# Patient Record
Sex: Male | Born: 1950 | Race: Black or African American | Hispanic: No | Marital: Married | Smoking: Former smoker
Health system: Southern US, Community
[De-identification: ages and names within clinical notes are randomized; demographics above are authoritative.]

## PROBLEM LIST (undated history)

## (undated) DIAGNOSIS — K219 Gastro-esophageal reflux disease without esophagitis: Secondary | ICD-10-CM

## (undated) DIAGNOSIS — I219 Acute myocardial infarction, unspecified: Secondary | ICD-10-CM

## (undated) DIAGNOSIS — I2510 Atherosclerotic heart disease of native coronary artery without angina pectoris: Secondary | ICD-10-CM

## (undated) DIAGNOSIS — E785 Hyperlipidemia, unspecified: Secondary | ICD-10-CM

## (undated) DIAGNOSIS — M1990 Unspecified osteoarthritis, unspecified site: Secondary | ICD-10-CM

## (undated) DIAGNOSIS — N529 Male erectile dysfunction, unspecified: Secondary | ICD-10-CM

## (undated) DIAGNOSIS — I209 Angina pectoris, unspecified: Secondary | ICD-10-CM

## (undated) DIAGNOSIS — G4730 Sleep apnea, unspecified: Secondary | ICD-10-CM

## (undated) DIAGNOSIS — T7840XA Allergy, unspecified, initial encounter: Secondary | ICD-10-CM

## (undated) DIAGNOSIS — I10 Essential (primary) hypertension: Secondary | ICD-10-CM

## (undated) DIAGNOSIS — I447 Left bundle-branch block, unspecified: Secondary | ICD-10-CM

## (undated) HISTORY — PX: CORONARY ANGIOPLASTY WITH STENT PLACEMENT: SHX49

## (undated) HISTORY — DX: Sleep apnea, unspecified: G47.30

## (undated) HISTORY — DX: Hyperlipidemia, unspecified: E78.5

## (undated) HISTORY — DX: Atherosclerotic heart disease of native coronary artery without angina pectoris: I25.10

## (undated) HISTORY — DX: Essential (primary) hypertension: I10

## (undated) HISTORY — DX: Male erectile dysfunction, unspecified: N52.9

## (undated) HISTORY — DX: Allergy, unspecified, initial encounter: T78.40XA

## (undated) HISTORY — PX: HERNIA REPAIR: SHX51

## (undated) HISTORY — PX: UMBILICAL HERNIA REPAIR: SHX196

## (undated) HISTORY — DX: Unspecified osteoarthritis, unspecified site: M19.90

## (undated) HISTORY — PX: INGUINAL HERNIA REPAIR: SUR1180

## (undated) HISTORY — DX: Left bundle-branch block, unspecified: I44.7

---

## 2002-08-09 HISTORY — PX: COLONOSCOPY: SHX174

## 2004-11-11 ENCOUNTER — Ambulatory Visit: Payer: Self-pay | Admitting: Internal Medicine

## 2005-02-03 ENCOUNTER — Ambulatory Visit: Payer: Self-pay | Admitting: Internal Medicine

## 2005-04-13 ENCOUNTER — Ambulatory Visit: Payer: Self-pay | Admitting: Internal Medicine

## 2005-05-11 ENCOUNTER — Ambulatory Visit: Payer: Self-pay | Admitting: Internal Medicine

## 2005-07-12 ENCOUNTER — Ambulatory Visit: Payer: Self-pay | Admitting: Internal Medicine

## 2005-10-12 ENCOUNTER — Ambulatory Visit: Payer: Self-pay | Admitting: Internal Medicine

## 2005-10-21 ENCOUNTER — Ambulatory Visit: Payer: Self-pay | Admitting: Internal Medicine

## 2005-11-05 ENCOUNTER — Ambulatory Visit: Payer: Self-pay

## 2006-08-15 ENCOUNTER — Ambulatory Visit: Payer: Self-pay | Admitting: Internal Medicine

## 2007-02-13 ENCOUNTER — Ambulatory Visit: Payer: Self-pay | Admitting: Internal Medicine

## 2007-02-13 LAB — CONVERTED CEMR LAB
ALT: 26 units/L (ref 0–53)
AST: 23 units/L (ref 0–37)
Albumin: 3.6 g/dL (ref 3.5–5.2)
Alkaline Phosphatase: 107 units/L (ref 39–117)
BUN: 11 mg/dL (ref 6–23)
Basophils Absolute: 0.1 10*3/uL (ref 0.0–0.1)
Basophils Relative: 1 % (ref 0.0–1.0)
Bilirubin Urine: NEGATIVE
Bilirubin, Direct: 0.1 mg/dL (ref 0.0–0.3)
CO2: 31 meq/L (ref 19–32)
Calcium: 9.1 mg/dL (ref 8.4–10.5)
Chloride: 110 meq/L (ref 96–112)
Cholesterol: 199 mg/dL (ref 0–200)
Creatinine, Ser: 1 mg/dL (ref 0.4–1.5)
Eosinophils Absolute: 0.3 10*3/uL (ref 0.0–0.6)
Eosinophils Relative: 4 % (ref 0.0–5.0)
GFR calc Af Amer: 100 mL/min
GFR calc non Af Amer: 82 mL/min
Glucose, Bld: 122 mg/dL — ABNORMAL HIGH (ref 70–99)
HCT: 43.3 % (ref 39.0–52.0)
HDL: 35.3 mg/dL — ABNORMAL LOW (ref >39.0)
Hemoglobin, Urine: NEGATIVE
Hemoglobin: 14.2 g/dL (ref 13.0–17.0)
Hgb A1c MFr Bld: 6 % (ref 4.6–6.0)
Ketones, ur: NEGATIVE mg/dL
LDL Cholesterol: 134 mg/dL — ABNORMAL HIGH (ref 0–99)
Leukocytes, UA: NEGATIVE
Lymphocytes Relative: 17.3 % (ref 12.0–46.0)
MCHC: 32.7 g/dL (ref 30.0–36.0)
MCV: 82.5 fL (ref 78.0–100.0)
Monocytes Absolute: 0.6 10*3/uL (ref 0.2–0.7)
Monocytes Relative: 7.9 % (ref 3.0–11.0)
Neutro Abs: 5.8 10*3/uL (ref 1.4–7.7)
Neutrophils Relative %: 69.8 % (ref 43.0–77.0)
Nitrite: NEGATIVE
PSA: 1.35 ng/mL (ref 0.10–4.00)
Platelets: 186 10*3/uL (ref 150–400)
Potassium: 4.1 meq/L (ref 3.5–5.1)
RBC: 5.25 M/uL (ref 4.22–5.81)
RDW: 13.1 % (ref 11.5–14.6)
Sodium: 142 meq/L (ref 135–145)
Specific Gravity, Urine: 1.015 (ref 1.000–1.03)
TSH: 4.14 microintl units/mL (ref 0.35–5.50)
Total Bilirubin: 0.6 mg/dL (ref 0.3–1.2)
Total CHOL/HDL Ratio: 5.6
Total Protein, Urine: NEGATIVE mg/dL
Total Protein: 7.5 g/dL (ref 6.0–8.3)
Triglycerides: 150 mg/dL — ABNORMAL HIGH (ref 0–149)
Urine Glucose: NEGATIVE mg/dL
Urobilinogen, UA: 0.2 (ref 0.0–1.0)
VLDL: 30 mg/dL (ref 0–40)
WBC: 8.2 10*3/uL (ref 4.5–10.5)
pH: 6.5 (ref 5.0–8.0)

## 2007-07-24 ENCOUNTER — Telehealth (INDEPENDENT_AMBULATORY_CARE_PROVIDER_SITE_OTHER): Payer: Self-pay | Admitting: *Deleted

## 2007-08-01 ENCOUNTER — Ambulatory Visit: Payer: Self-pay | Admitting: Internal Medicine

## 2007-08-01 DIAGNOSIS — I10 Essential (primary) hypertension: Secondary | ICD-10-CM | POA: Insufficient documentation

## 2007-08-01 DIAGNOSIS — E785 Hyperlipidemia, unspecified: Secondary | ICD-10-CM | POA: Insufficient documentation

## 2007-08-01 DIAGNOSIS — Z9861 Coronary angioplasty status: Secondary | ICD-10-CM

## 2007-08-01 DIAGNOSIS — R209 Unspecified disturbances of skin sensation: Secondary | ICD-10-CM | POA: Insufficient documentation

## 2007-08-01 DIAGNOSIS — I2510 Atherosclerotic heart disease of native coronary artery without angina pectoris: Secondary | ICD-10-CM | POA: Insufficient documentation

## 2007-08-21 ENCOUNTER — Ambulatory Visit: Payer: Self-pay | Admitting: Internal Medicine

## 2007-08-21 DIAGNOSIS — R7309 Other abnormal glucose: Secondary | ICD-10-CM | POA: Insufficient documentation

## 2007-08-21 DIAGNOSIS — N529 Male erectile dysfunction, unspecified: Secondary | ICD-10-CM | POA: Insufficient documentation

## 2007-11-20 ENCOUNTER — Encounter: Payer: Self-pay | Admitting: Internal Medicine

## 2008-09-24 ENCOUNTER — Encounter: Payer: Self-pay | Admitting: Internal Medicine

## 2008-10-08 ENCOUNTER — Ambulatory Visit: Payer: Self-pay | Admitting: Internal Medicine

## 2008-11-19 ENCOUNTER — Encounter: Payer: Self-pay | Admitting: Internal Medicine

## 2008-12-24 ENCOUNTER — Ambulatory Visit: Payer: Self-pay | Admitting: Internal Medicine

## 2009-02-17 ENCOUNTER — Ambulatory Visit: Payer: Self-pay | Admitting: Internal Medicine

## 2009-02-17 DIAGNOSIS — G4700 Insomnia, unspecified: Secondary | ICD-10-CM | POA: Insufficient documentation

## 2009-02-18 LAB — CONVERTED CEMR LAB
ALT: 16 units/L (ref 0–53)
AST: 19 units/L (ref 0–37)
Albumin: 3.6 g/dL (ref 3.5–5.2)
Alkaline Phosphatase: 96 units/L (ref 39–117)
BUN: 14 mg/dL (ref 6–23)
Basophils Absolute: 0 10*3/uL (ref 0.0–0.1)
Basophils Relative: 0.8 % (ref 0.0–3.0)
Bilirubin, Direct: 0.1 mg/dL (ref 0.0–0.3)
CO2: 32 meq/L (ref 19–32)
Calcium: 9 mg/dL (ref 8.4–10.5)
Chloride: 104 meq/L (ref 96–112)
Cholesterol: 193 mg/dL (ref 0–200)
Creatinine, Ser: 1 mg/dL (ref 0.4–1.5)
Eosinophils Absolute: 0.3 10*3/uL (ref 0.0–0.7)
Eosinophils Relative: 5.4 % — ABNORMAL HIGH (ref 0.0–5.0)
GFR calc non Af Amer: 98.9 mL/min (ref >60)
Glucose, Bld: 116 mg/dL — ABNORMAL HIGH (ref 70–99)
HCT: 41 % (ref 39.0–52.0)
HDL: 32 mg/dL — ABNORMAL LOW (ref >39.00)
Hemoglobin: 14.3 g/dL (ref 13.0–17.0)
LDL Cholesterol: 135 mg/dL — ABNORMAL HIGH (ref 0–99)
Lymphocytes Relative: 22.7 % (ref 12.0–46.0)
Lymphs Abs: 1.4 10*3/uL (ref 0.7–4.0)
MCHC: 34.9 g/dL (ref 30.0–36.0)
MCV: 81.8 fL (ref 78.0–100.0)
Monocytes Absolute: 0.6 10*3/uL (ref 0.1–1.0)
Monocytes Relative: 9.3 % (ref 3.0–12.0)
Neutro Abs: 3.8 10*3/uL (ref 1.4–7.7)
Neutrophils Relative %: 61.8 % (ref 43.0–77.0)
Platelets: 156 10*3/uL (ref 150.0–400.0)
Potassium: 3.8 meq/L (ref 3.5–5.1)
RBC: 5.01 M/uL (ref 4.22–5.81)
RDW: 13.3 % (ref 11.5–14.6)
Sodium: 142 meq/L (ref 135–145)
TSH: 2.28 microintl units/mL (ref 0.35–5.50)
Total Bilirubin: 0.7 mg/dL (ref 0.3–1.2)
Total CHOL/HDL Ratio: 6
Total Protein: 7.3 g/dL (ref 6.0–8.3)
Triglycerides: 128 mg/dL (ref 0.0–149.0)
VLDL: 25.6 mg/dL (ref 0.0–40.0)
WBC: 6.1 10*3/uL (ref 4.5–10.5)

## 2009-04-24 ENCOUNTER — Encounter: Payer: Self-pay | Admitting: Internal Medicine

## 2009-06-13 ENCOUNTER — Ambulatory Visit: Payer: Self-pay | Admitting: Internal Medicine

## 2009-11-17 ENCOUNTER — Telehealth: Payer: Self-pay | Admitting: Internal Medicine

## 2009-12-01 ENCOUNTER — Telehealth: Payer: Self-pay | Admitting: Internal Medicine

## 2010-01-21 ENCOUNTER — Telehealth: Payer: Self-pay | Admitting: Internal Medicine

## 2010-06-02 ENCOUNTER — Ambulatory Visit: Payer: Self-pay | Admitting: Internal Medicine

## 2010-06-02 DIAGNOSIS — L723 Sebaceous cyst: Secondary | ICD-10-CM | POA: Insufficient documentation

## 2010-06-02 DIAGNOSIS — M25569 Pain in unspecified knee: Secondary | ICD-10-CM | POA: Insufficient documentation

## 2010-06-22 ENCOUNTER — Encounter: Payer: Self-pay | Admitting: Internal Medicine

## 2010-08-05 ENCOUNTER — Ambulatory Visit: Payer: Self-pay | Admitting: Internal Medicine

## 2010-08-12 ENCOUNTER — Other Ambulatory Visit: Payer: Self-pay | Admitting: Internal Medicine

## 2010-08-12 ENCOUNTER — Ambulatory Visit
Admission: RE | Admit: 2010-08-12 | Discharge: 2010-08-12 | Payer: Self-pay | Source: Home / Self Care | Attending: Internal Medicine | Admitting: Internal Medicine

## 2010-08-12 ENCOUNTER — Encounter: Payer: Self-pay | Admitting: Internal Medicine

## 2010-08-12 DIAGNOSIS — D485 Neoplasm of uncertain behavior of skin: Secondary | ICD-10-CM | POA: Insufficient documentation

## 2010-08-12 LAB — CBC WITH DIFFERENTIAL/PLATELET
Basophils Absolute: 0 10*3/uL (ref 0.0–0.1)
Basophils Relative: 0.5 % (ref 0.0–3.0)
Eosinophils Absolute: 0.3 10*3/uL (ref 0.0–0.7)
Eosinophils Relative: 4.9 % (ref 0.0–5.0)
HCT: 43.2 % (ref 39.0–52.0)
Hemoglobin: 14.4 g/dL (ref 13.0–17.0)
Lymphocytes Relative: 17.8 % (ref 12.0–46.0)
Lymphs Abs: 1.3 10*3/uL (ref 0.7–4.0)
MCHC: 33.3 g/dL (ref 30.0–36.0)
MCV: 82.1 fl (ref 78.0–100.0)
Monocytes Absolute: 0.6 10*3/uL (ref 0.1–1.0)
Monocytes Relative: 8.8 % (ref 3.0–12.0)
Neutro Abs: 4.8 10*3/uL (ref 1.4–7.7)
Neutrophils Relative %: 68 % (ref 43.0–77.0)
Platelets: 176 10*3/uL (ref 150.0–400.0)
RBC: 5.26 Mil/uL (ref 4.22–5.81)
RDW: 14.8 % — ABNORMAL HIGH (ref 11.5–14.6)
WBC: 7.1 10*3/uL (ref 4.5–10.5)

## 2010-08-12 LAB — BASIC METABOLIC PANEL
BUN: 13 mg/dL (ref 6–23)
CO2: 29 mEq/L (ref 19–32)
Calcium: 9 mg/dL (ref 8.4–10.5)
Chloride: 104 mEq/L (ref 96–112)
Creatinine, Ser: 0.8 mg/dL (ref 0.4–1.5)
GFR: 129.14 mL/min (ref >60.00)
Glucose, Bld: 106 mg/dL — ABNORMAL HIGH (ref 70–99)
Potassium: 3.8 mEq/L (ref 3.5–5.1)
Sodium: 138 mEq/L (ref 135–145)

## 2010-08-12 LAB — PSA: PSA: 1.84 ng/mL (ref 0.10–4.00)

## 2010-08-12 LAB — URINALYSIS
Bilirubin Urine: NEGATIVE
Hemoglobin, Urine: NEGATIVE
Ketones, ur: NEGATIVE
Leukocytes, UA: NEGATIVE
Nitrite: NEGATIVE
Specific Gravity, Urine: 1.02 (ref 1.000–1.030)
Total Protein, Urine: NEGATIVE
Urine Glucose: NEGATIVE
Urobilinogen, UA: 0.2 (ref 0.0–1.0)
pH: 6 (ref 5.0–8.0)

## 2010-08-12 LAB — HEPATIC FUNCTION PANEL
ALT: 14 U/L (ref 0–53)
AST: 18 U/L (ref 0–37)
Albumin: 3.6 g/dL (ref 3.5–5.2)
Alkaline Phosphatase: 112 U/L (ref 39–117)
Bilirubin, Direct: 0.1 mg/dL (ref 0.0–0.3)
Total Bilirubin: 0.7 mg/dL (ref 0.3–1.2)
Total Protein: 7.5 g/dL (ref 6.0–8.3)

## 2010-08-12 LAB — TSH: TSH: 2.3 u[IU]/mL (ref 0.35–5.50)

## 2010-08-12 LAB — LIPID PANEL
Cholesterol: 272 mg/dL — ABNORMAL HIGH (ref 0–200)
HDL: 34.9 mg/dL — ABNORMAL LOW (ref >39.00)
Total CHOL/HDL Ratio: 8
Triglycerides: 129 mg/dL (ref 0.0–149.0)
VLDL: 25.8 mg/dL (ref 0.0–40.0)

## 2010-08-12 LAB — LDL CHOLESTEROL, DIRECT: Direct LDL: 224.8 mg/dL

## 2010-09-08 NOTE — Miscellaneous (Signed)
Summary: Orders Update  Clinical Lists Changes  Orders: Added new Service order of No Show NS50 (NS50) - Signed 

## 2010-09-08 NOTE — Assessment & Plan Note (Signed)
Summary: PER PHONE NOTE-D/T-SCHED-R SHOULDER PAIN-STC   Vital Signs:  Patient Profile:   60 Years Old Male Weight:      233 pounds Temp:     97.9 degrees F oral Pulse rate:   83 / minute BP sitting:   123 / 76  (left arm)  Vitals Entered By: Tora Perches (August 01, 2007 4:05 PM)             Is Patient Diabetic? No     Chief Complaint:  Multiple medical problems or concerns.  History of Present Illness: C/o 2 wks of R elbow and shoulder pain after moving furniture. Index finger on R was  tingling several times and felt numb. R trap was hurting too.    Past Medical History:    Coronary artery disease    Hyperlipidemia    Hypertension    ED   Family History:    Family History Hypertension  Social History:    Occupation:    Married    Former Smoker   Risk Factors:  Tobacco use:  quit   Review of Systems  The patient denies chest pain and syncope.     Physical Exam  General:     Well-developed,well-nourished,in no acute distress; alert,appropriate and cooperative throughout examination Ears:     External ear exam shows no significant lesions or deformities.  Otoscopic examination reveals clear canals, tympanic membranes are intact bilaterally without bulging, retraction, inflammation or discharge. Hearing is grossly normal bilaterally. Nose:     External nasal examination shows no deformity or inflammation. Nasal mucosa are pink and moist without lesions or exudates. Mouth:     Oral mucosa and oropharynx without lesions or exudates.  Teeth in good repair. Neck:     Cervical spine is tender to palpation over paraspinal muscles and with the ROM  Lungs:     Normal respiratory effort, chest expands symmetrically. Lungs are clear to auscultation, no crackles or wheezes. Heart:     Normal rate and regular rhythm. S1 and S2 normal without gallop, murmur, click, rub or other extra sounds. Abdomen:     Bowel sounds positive,abdomen soft and non-tender  without masses, organomegaly or hernias noted. Msk:     as above Extremities:     No clubbing, cyanosis, edema, or deformity noted with normal full range of motion of all joints.   Neurologic:     No cranial nerve deficits noted. Station and gait are normal. Plantar reflexes are down-going bilaterally. DTRs are symmetrical throughout. Sensory, motor and coordinative functions appear intact.    Impression & Recommendations:  Problem # 1:  CERVICAL STRAIN (ICD-847.0) Assessment: New Contour pillow. Exercises given. His updated medication list for this problem includes:    Darvocet-n 100 100-650 Mg Tabs (Propoxyphene n-apap) .Marland Kitchen... 1 by mouth four times a day as needed   Problem # 2:  PARESTHESIA (ICD-782.0) Due to radiculopathy. Predn. taper  Problem # 3:  HYPERTENSION (ICD-401.9)  His updated medication list for this problem includes:    Lotrel 10-40 Mg Caps (Amlodipine besy-benazepril hcl) .Marland Kitchen... 1 by mouth qd    Benicar Hct 40-25 Mg Tabs (Olmesartan medoxomil-hctz) .Marland Kitchen... Take 1 tab by mouth daily   Problem # 4:  CORONARY ARTERY DISEASE (ICD-414.00)  His updated medication list for this problem includes:    Lotrel 10-40 Mg Caps (Amlodipine besy-benazepril hcl) .Marland Kitchen... 1 by mouth qd    Benicar Hct 40-25 Mg Tabs (Olmesartan medoxomil-hctz) .Marland Kitchen... Take 1 tab by mouth daily   Complete  Medication List: 1)  Prevacid 30 Mg Cpdr (Lansoprazole) .Marland Kitchen.. 1 po qd 2)  Lotrel 10-40 Mg Caps (Amlodipine besy-benazepril hcl) .Marland Kitchen.. 1 by mouth qd 3)  Vytorin 10-40 Mg Tabs (Ezetimibe-simvastatin) .Marland Kitchen.. 1 by mouth qd 4)  Benicar Hct 40-25 Mg Tabs (Olmesartan medoxomil-hctz) .... Take 1 tab by mouth daily 5)  Vitamin D3 1000 Unit Tabs (Cholecalciferol) .Marland Kitchen.. 1 qd 6)  Prednisone 10 Mg Tabs (Prednisone) .... 3 tab per day x5d, then 2 tab per day x 5d then1 tab per day x 5 d, then stop. take pc 7)  Darvocet-n 100 100-650 Mg Tabs (Propoxyphene n-apap) .Marland Kitchen.. 1 by mouth four times a day as needed   Patient  Instructions: 1)  Please schedule a follow-up appointment in 3 months.    Prescriptions: DARVOCET-N 100 100-650 MG TABS (PROPOXYPHENE N-APAP) 1 by mouth four times a day as needed  #60 x 1   Entered and Authorized by:   Tresa Garter MD   Signed by:   Tresa Garter MD on 08/01/2007   Method used:   Print then Give to Patient   RxID:   [id number] PREDNISONE 10 MG  TABS (PREDNISONE) 3 tab per day x5d, then 2 tab per day x 5d then1 tab per day x 5 d, then stop. Take pc  #qs x 0   Entered and Authorized by:   Tresa Garter MD   Signed by:   Tresa Garter MD on 08/01/2007   Method used:   Print then Give to Patient   RxID:   [id number]  ]

## 2010-09-08 NOTE — Assessment & Plan Note (Signed)
Summary: form filled/vg   Vital Signs:  Patient profile:   60 year old male Height:      69 inches Weight:      236 pounds BMI:     34.98 Temp:     97.8 degrees F oral Pulse rate:   66 / minute BP sitting:   136 / 80  (left arm)  Vitals Entered By: Tora Perches (Dec 24, 2008 10:00 AM) CC: form filled out Is Patient Diabetic? No   CC:  form filled out.  History of Present Illness: Needs a DMV exam form F/u CAD, HTN, elev. chol  Problems Prior to Update: 1)  Abnormal Glucose Nec  (ICD-790.29) 2)  Erectile Dysfunction  (ICD-607.84) 3)  Paresthesia  (ICD-782.0) 4)  Hypertension  (ICD-401.9) 5)  Hyperlipidemia  (ICD-272.4) 6)  Coronary Artery Disease  (ICD-414.00) 7)  Cervical Strain  (ICD-847.0)  Medications Prior to Update: 1)  Vytorin 10-40 Mg Tabs (Ezetimibe-Simvastatin) .Marland Kitchen.. 1 By Mouth Qd 2)  Benicar Hct 40-25 Mg  Tabs (Olmesartan Medoxomil-Hctz) .... Take 1 Tab By Mouth Daily 3)  Metoprolol Tartrate 50 Mg  Tabs (Metoprolol Tartrate) .... Take 1 Tablet By Mouth Two Times A Day 4)  Nitrostat 0.4 Mg Subl (Nitroglycerin) .... As Needed 5)  Aspirin 81 Mg Tabs (Aspirin) .... Once Daily 6)  Amlodipine Besylate 10 Mg  Tabs (Amlodipine Besylate) .Marland Kitchen.. 1 Once Daily 7)  Plavix 75 Mg Tabs (Clopidogrel Bisulfate) .Marland Kitchen.. 1 By Mouth Qd 8)  Vitamin D3 1000 Unit  Tabs (Cholecalciferol) .Marland Kitchen.. 1 Qd  Current Medications (verified): 1)  Vytorin 10-40 Mg Tabs (Ezetimibe-Simvastatin) .Marland Kitchen.. 1 By Mouth Qd 2)  Benicar Hct 40-25 Mg  Tabs (Olmesartan Medoxomil-Hctz) .... Take 1 Tab By Mouth Daily 3)  Metoprolol Tartrate 50 Mg  Tabs (Metoprolol Tartrate) .... Take 1 Tablet By Mouth Two Times A Day 4)  Nitrostat 0.4 Mg Subl (Nitroglycerin) .... As Needed 5)  Aspirin 81 Mg Tabs (Aspirin) .... Once Daily 6)  Amlodipine Besylate 10 Mg  Tabs (Amlodipine Besylate) .Marland Kitchen.. 1 Once Daily 7)  Plavix 75 Mg Tabs (Clopidogrel Bisulfate) .Marland Kitchen.. 1 By Mouth Qd 8)  Vitamin D3 1000 Unit  Tabs (Cholecalciferol) .Marland Kitchen.. 1  Qd  Allergies (verified): No Known Drug Allergies  Past History:  Past Medical History:    Coronary artery disease Dr Desma Maxim in [place]    Hyperlipidemia    Hypertension    ED (10/08/2008)  Past Surgical History:    STENTs 2010 (10/08/2008)  Family History:    Family History Hypertension     (08/01/2007)  Social History:    Occupation:    Married    Former Smoker     (08/01/2007)  Review of Systems  The patient denies anorexia, fever, vision loss, chest pain, syncope, dyspnea on exertion, peripheral edema, prolonged cough, abdominal pain, and melena.    Physical Exam  General:  Well-developed,well-nourished,in no acute distress; alert,appropriate and cooperative throughout examination Eyes:  WNL Ears:  External ear exam shows no significant lesions or deformities.  Otoscopic examination reveals clear canals, tympanic membranes are intact bilaterally without bulging, retraction, inflammation or discharge. Hearing is grossly normal bilaterally. Nose:  External nasal examination shows no deformity or inflammation. Nasal mucosa are pink and moist without lesions or exudates. Mouth:  Oral mucosa and oropharynx without lesions or exudates.  Teeth in good repair. Neck:  No deformities, masses, or tenderness noted. Lungs:  Normal respiratory effort, chest expands symmetrically. Lungs are clear to auscultation, no crackles or wheezes. Heart:  Normal rate and regular rhythm. S1 and S2 normal without gallop, murmur, click, rub or other extra sounds. Abdomen:  Bowel sounds positive,abdomen soft and non-tender without masses, organomegaly or hernias noted. Msk:  No deformity or scoliosis noted of thoracic or lumbar spine.   Extremities:  No clubbing, cyanosis, edema, or deformity noted with normal full range of motion of all joints.   Neurologic:  No cranial nerve deficits noted. Station and gait are normal. Plantar reflexes are down-going bilaterally. DTRs are symmetrical throughout.  Sensory, motor and coordinative functions appear intact. Skin:  Intact without suspicious lesions or rashes Psych:  Cognition and judgment appear intact. Alert and cooperative with normal attention span and concentration. No apparent delusions, illusions, hallucinations   Impression & Recommendations:  Problem # 1:  CORONARY ARTERY DISEASE (ICD-414.00) Assessment Improved  His updated medication list for this problem includes:    Benicar Hct 40-25 Mg Tabs (Olmesartan medoxomil-hctz) .Marland Kitchen... Take 1 tab by mouth daily    Metoprolol Tartrate 50 Mg Tabs (Metoprolol tartrate) .Marland Kitchen... Take 1 tablet by mouth two times a day    Nitrostat 0.4 Mg Subl (Nitroglycerin) .Marland Kitchen... As needed    Aspirin 81 Mg Tabs (Aspirin) ..... Once daily    Amlodipine Besylate 10 Mg Tabs (Amlodipine besylate) .Marland Kitchen... 1 once daily    Plavix 75 Mg Tabs (Clopidogrel bisulfate) .Marland Kitchen... 1 by mouth qd  Problem # 2:  HYPERTENSION (ICD-401.9) Assessment: Improved  His updated medication list for this problem includes:    Benicar Hct 40-25 Mg Tabs (Olmesartan medoxomil-hctz) .Marland Kitchen... Take 1 tab by mouth daily    Metoprolol Tartrate 50 Mg Tabs (Metoprolol tartrate) .Marland Kitchen... Take 1 tablet by mouth two times a day    Amlodipine Besylate 10 Mg Tabs (Amlodipine besylate) .Marland Kitchen... 1 once daily  Problem # 3:  HYPERLIPIDEMIA (ICD-272.4) Assessment: Improved  His updated medication list for this problem includes:    Vytorin 10-40 Mg Tabs (Ezetimibe-simvastatin) .Marland Kitchen... 1 by mouth qd  Problem # 4:  Preventive Health Care (ICD-V70.0) Form filled out  Complete Medication List: 1)  Vytorin 10-40 Mg Tabs (Ezetimibe-simvastatin) .Marland Kitchen.. 1 by mouth qd 2)  Benicar Hct 40-25 Mg Tabs (Olmesartan medoxomil-hctz) .... Take 1 tab by mouth daily 3)  Metoprolol Tartrate 50 Mg Tabs (Metoprolol tartrate) .... Take 1 tablet by mouth two times a day 4)  Nitrostat 0.4 Mg Subl (Nitroglycerin) .... As needed 5)  Aspirin 81 Mg Tabs (Aspirin) .... Once daily 6)   Amlodipine Besylate 10 Mg Tabs (Amlodipine besylate) .Marland Kitchen.. 1 once daily 7)  Plavix 75 Mg Tabs (Clopidogrel bisulfate) .Marland Kitchen.. 1 by mouth qd 8)  Vitamin D3 1000 Unit Tabs (Cholecalciferol) .Marland Kitchen.. 1 qd  Patient Instructions: 1)  [email or web address] 2)  Please schedule a follow-up appointment in 6 months well with labs.

## 2010-09-08 NOTE — Letter (Signed)
Summary: [facility]   Imported By: Sherian Rein 05/05/2009 10:11:01  _____________________________________________________________________  External Attachment:    Type:   Image     Comment:   External Document

## 2010-09-08 NOTE — Assessment & Plan Note (Signed)
Summary: FU---STC   Vital Signs:  Patient profile:   60 year old male Height:      69 inches Weight:      241 pounds BMI:     35.72 Temp:     98.8 degrees F oral Pulse rate:   76 / minute Pulse rhythm:   regular Resp:     16 per minute BP sitting:   140 / 90  (left arm) Cuff size:   large  Vitals Entered By: Lanier Prude, CMA(AAMA) (June 02, 2010 9:33 AM) CC: f/u c/o bilateral knee pain X 3 mo Is Patient Diabetic? No Comments pt is not taking Plavix or Lorazepam   CC:  f/u c/o bilateral knee pain X 3 mo.  History of Present Illness: C/o B knees pain x 2 months - mild swelling - kneeling a lot at work C/o bump on head The patient presents for a follow up of CAD hyperlipidemia   Current Medications (verified): 1)  Vytorin 10-40 Mg Tabs (Ezetimibe-Simvastatin) .Marland Kitchen.. 1 By Mouth Qd 2)  Toprol Xl 25 Mg Xr24h-Tab (Metoprolol Succinate) .Marland Kitchen.. 1 Once Daily 3)  Nitrostat 0.4 Mg Subl (Nitroglycerin) .... As Needed 4)  Aspirin 81 Mg Tabs (Aspirin) .... Once Daily 5)  Amlodipine Besylate 10 Mg  Tabs (Amlodipine Besylate) .Marland Kitchen.. 1 Once Daily 6)  Plavix 75 Mg Tabs (Clopidogrel Bisulfate) .Marland Kitchen.. 1 By Mouth Qd 7)  Vitamin D3 1000 Unit  Tabs (Cholecalciferol) .Marland Kitchen.. 1 Qd 8)  Lorazepam 0.5 Mg Tabs (Lorazepam) .Marland Kitchen.. 1 - 2  By Mouth At Bedtime As Needed Insomnia 9)  Losartan Potassium-Hctz 100-25 Mg Tabs (Losartan Potassium-Hctz) .... 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    Physical Exam  General:  overweight-appearing.   Head:  2 cm seb cyst on L temp-occip area Mouth:  Oral mucosa and oropharynx without  lesions or exudates.  Teeth in good repair. Neck:  No deformities, masses, or tenderness noted. Lungs:  Normal respiratory effort, chest expands symmetrically. Lungs are clear to auscultation, no crackles or wheezes. Heart:  Normal rate and regular rhythm. S1 and S2 normal without gallop, murmur, click, rub or other extra sounds. Abdomen:  Bowel sounds positive,abdomen soft and non-tender without masses, organomegaly or hernias noted. Msk:  B knees are crackling and tender w/ROM Neurologic:  No cranial nerve deficits noted. Station and gait are normal. Plantar reflexes are down-going bilaterally. DTRs are symmetrical throughout. Sensory, motor and coordinative functions appear intact. Skin:  Intact without suspicious lesions or rashes Psych:  Cognition and judgment appear intact. Alert and cooperative with normal attention span and concentration. No apparent delusions, illusions, hallucinations   Impression & Recommendations:  Problem # 1:  KNEE PAIN (ICD-719.46) B Assessment New  His updated medication list for this problem includes:    Aspirin 81 Mg Tabs (Aspirin) ..... Once daily    Ibuprofen 600 Mg Tabs (Ibuprofen) .Marland Kitchen... 1 by mouth bid  pc x 1 wk then as needed for  pain  Orders: T-Knee Left 2 view (73560TC) T-Knee Right 2 view (73560TC)  Problem # 2:  SEBACEOUS CYST, SCALP (ICD-706.2) Assessment: New  Orders: Surgical Referral (Surgery)  Problem # 3:  CORONARY  ARTERY DISEASE (ICD-414.00) Assessment: Unchanged Card appt is pending  His updated medication list for this problem includes:    Toprol Xl 25 Mg Xr24h-tab (Metoprolol succinate) .Marland Kitchen... 1 once daily    Nitrostat 0.4 Mg Subl (Nitroglycerin) .Marland Kitchen... As needed    Aspirin 81 Mg Tabs (Aspirin) ..... Once daily    Amlodipine Besylate 10 Mg Tabs (Amlodipine besylate) .Marland Kitchen... 1 once daily    Plavix 75 Mg Tabs (Clopidogrel bisulfate) .Marland Kitchen... 1 by mouth qd    Losartan Potassium-hctz 100-25 Mg Tabs (Losartan potassium-hctz) ..... One  by mouth once daily for high blood pressure  Problem # 4:  HTN Assessment: Unchanged On the regimen of medicine(s) reflected in the chart    Problem # 5:  HYPERLIPIDEMIA (ICD-272.4) Assessment: Unchanged  His updated medication list for this problem includes:    Vytorin 10-40 Mg Tabs (Ezetimibe-simvastatin) .Marland Kitchen... 1 by mouth qd  Complete Medication List: 1)  Vytorin 10-40 Mg Tabs (Ezetimibe-simvastatin) .Marland Kitchen.. 1 by mouth qd 2)  Toprol Xl 25 Mg Xr24h-tab (Metoprolol succinate) .Marland Kitchen.. 1 once daily 3)  Nitrostat 0.4 Mg Subl (Nitroglycerin) .... As needed 4)  Aspirin 81 Mg Tabs (Aspirin) .... Once daily 5)  Amlodipine Besylate 10 Mg Tabs (Amlodipine besylate) .Marland Kitchen.. 1 once daily 6)  Plavix 75 Mg Tabs (Clopidogrel bisulfate) .Marland Kitchen.. 1 by mouth qd 7)  Vitamin D3 1000 Unit Tabs (Cholecalciferol) .Marland Kitchen.. 1 qd 8)  Lorazepam 0.5 Mg Tabs (Lorazepam) .Marland Kitchen.. 1 - 2  by mouth at bedtime as needed insomnia 9)  Losartan Potassium-hctz 100-25 Mg Tabs (Losartan potassium-hctz) .... One by mouth once daily for high blood pressure 10)  Vitamin D 1000 Unit Tabs (Cholecalciferol) .Marland Kitchen.. 1 by mouth qd 11)  Ibuprofen 600 Mg Tabs (Ibuprofen) .Marland Kitchen.. 1 by mouth bid  pc x 1 wk then as needed for  pain  Contraindications/Deferment of Procedures/Staging:    Test/Procedure: FLU VAX    Reason for deferment: patient declined   Patient Instructions: 1)  Please schedule a follow-up appointment in 2 months well w/labs. Prescriptions: LOSARTAN POTASSIUM-HCTZ 100-25 MG TABS (LOSARTAN POTASSIUM-HCTZ) One by mouth once daily for high blood pressure  #90 x 3   Entered and Authorized by:   Tresa Garter MD   Signed by:   Tresa Garter MD on 06/02/2010   Method used:   Print then Give to Patient   RxID:   [id number] LORAZEPAM 0.5 MG TABS (LORAZEPAM) 1 - 2  by mouth at bedtime as needed insomnia  #60 x 3   Entered and Authorized by:   Tresa Garter MD   Signed by:   Tresa Garter MD on 06/02/2010   Method  used:   Print then Give to Patient   RxID:   [id number] PLAVIX 75 MG TABS (CLOPIDOGREL BISULFATE) 1 by mouth qd  #90 x 3   Entered and Authorized by:   Tresa Garter MD   Signed by:   Tresa Garter MD on 06/02/2010   Method used:   Print then Give to Patient   RxID:   [id number] AMLODIPINE BESYLATE 10 MG  TABS (AMLODIPINE BESYLATE) 1 once daily  #90 x 3   Entered and Authorized by:   Tresa Garter MD   Signed by:   Tresa Garter MD on 06/02/2010   Method used:   Print then Give to Patient   RxID:   [id number] NITROSTAT 0.4 MG SUBL (NITROGLYCERIN) as needed  #20 x 1   Entered and Authorized by:  Tresa Garter MD   Signed by:   Tresa Garter MD on 06/02/2010   Method used:   Print then Give to Patient   RxID:   [id number] TOPROL XL 25 MG XR24H-TAB (METOPROLOL SUCCINATE) 1 once daily  #90 x 3   Entered and Authorized by:   Tresa Garter MD   Signed by:   Tresa Garter MD on 06/02/2010   Method used:   Print then Give to Patient   RxID:   [id number] VYTORIN 10-40 MG TABS (EZETIMIBE-SIMVASTATIN) 1 by mouth qd  #90 x 3   Entered and Authorized by:   Tresa Garter MD   Signed by:   Tresa Garter MD on 06/02/2010   Method used:   Print then Give to Patient   RxID:   [id number] IBUPROFEN 600 MG TABS (IBUPROFEN) 1 by mouth bid  pc x 1 wk then as needed for  pain  #60 x 3   Entered and Authorized by:   Tresa Garter MD   Signed by:   Tresa Garter MD on 06/02/2010   Method used:   Electronically to        [facility] [phone]* (retail)       [address]       [place], [place]  [address]       Ph: [phone] or [phone]       Fax: [phone]   RxID:   [id number]    Orders Added: 1)  Est. Patient Level IV [54270] 2)  T-Knee Left 2 view [73560TC] 3)  T-Knee Right 2 view [73560TC] 4)  Surgical Referral [Surgery]

## 2010-09-08 NOTE — Progress Notes (Signed)
Summary: med change  Phone Note From Pharmacy   Caller: [facility] [phone]* Summary of Call: Rec'd fax from pharmacy that the patient would like to try lower cost/generic of Benicar. Losartan HCTZ comes in 50-12.5 and 100-12.5 mg, please advise. Initial call taken by: Lucious Groves,  November 17, 2009 8:50 AM    New/Updated Medications: LOSARTAN POTASSIUM-HCTZ 100-25 MG TABS (LOSARTAN POTASSIUM-HCTZ) One by mouth once daily for high blood pressure Prescriptions: LOSARTAN POTASSIUM-HCTZ 100-25 MG TABS (LOSARTAN POTASSIUM-HCTZ) One by mouth once daily for high blood pressure  #30 x 11   Entered and Authorized by:   Etta Grandchild MD   Signed by:   Etta Grandchild MD on 11/17/2009   Method used:   Electronically to        [facility] [phone]* (retail)       [address]       [place], [place]  [address]       Ph: [phone] or [phone]       Fax: [phone]   RxID:   [id number]

## 2010-09-08 NOTE — Assessment & Plan Note (Signed)
Summary: 4 mos f/u $50 / cd   Vital Signs:  Patient profile:   60 year old male Weight:      237 pounds Temp:     99.4 degrees F oral Pulse rate:   64 / minute BP sitting:   122 / 88  (left arm)  Vitals Entered By: Tora Perches (February 17, 2009 10:30 AM) CC: f/u Is Patient Diabetic? No   CC:  f/u.  History of Present Illness: The patient presents for a follow up of hypertension, CAD, hyperlipidemia. No CP. 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   Current Medications (verified): 1)  Vytorin 10-40 Mg Tabs (Ezetimibe-Simvastatin) .Marland Kitchen.. 1 By Mouth Qd 2)  Benicar Hct 40-25 Mg  Tabs (Olmesartan Medoxomil-Hctz) .... Take 1 Tab By Mouth Daily 3)  Metoprolol Tartrate 50 Mg  Tabs (Metoprolol Tartrate) .... Take 1 Tablet By Mouth Two Times A Day 4)  Nitrostat 0.4 Mg Subl (Nitroglycerin) .... As Needed 5)  Aspirin 81 Mg Tabs (Aspirin) .... Once Daily 6)  Amlodipine Besylate 10 Mg  Tabs (Amlodipine Besylate) .Marland Kitchen.. 1 Once Daily 7)  Plavix 75 Mg Tabs (Clopidogrel Bisulfate) .Marland Kitchen.. 1 By Mouth Qd 8)  Vitamin D3 1000 Unit  Tabs (Cholecalciferol) .Marland Kitchen.. 1 Qd  Allergies (verified): No Known Drug Allergies  Family History: Reviewed history from 08/01/2007 and no changes required. Family History Hypertension  Social History: Reviewed history from 08/01/2007 and no changes required. Occupation: 2nd shift Married Former Smoker  Physical Exam  General:  overweight-appearing.   Mouth:  Oral mucosa and oropharynx without lesions or exudates.  Teeth in good repair. Neck:  No deformities, masses, or tenderness noted. Lungs:  Normal respiratory effort, chest expands symmetrically. Lungs are clear to auscultation, no crackles or wheezes. Heart:  Normal rate and regular rhythm. S1 and S2 normal without gallop, murmur, click, rub or other extra sounds. Abdomen:  Bowel sounds positive,abdomen soft and non-tender without masses, organomegaly or hernias noted. Msk:  No deformity or scoliosis noted of  thoracic or lumbar spine.   Extremities:  No clubbing, cyanosis, edema, or deformity noted with normal full range of motion of all joints.   Neurologic:  No cranial nerve deficits noted. Station and gait are normal. Plantar reflexes are down-going bilaterally. DTRs are symmetrical throughout. Sensory, motor and coordinative functions appear intact. Skin:  Intact without suspicious lesions or rashes Psych:  Cognition and judgment appear intact. Alert and cooperative with normal attention span and concentration. No apparent delusions, illusions, hallucinations   Impression & Recommendations:  Problem # 1:  HYPERTENSION (ICD-401.9) Assessment Unchanged  His updated medication list for this problem includes:    Benicar Hct 40-25 Mg Tabs (Olmesartan medoxomil-hctz) .Marland Kitchen... Take 1 tab by mouth daily    Metoprolol Tartrate 50 Mg Tabs (Metoprolol tartrate) .Marland Kitchen... Take 1 tablet by mouth two times a day    Amlodipine Besylate 10 Mg Tabs (Amlodipine besylate) .Marland Kitchen... 1 once daily  Orders: TLB-BMP (Basic Metabolic Panel-BMET) (80048-METABOL) TLB-CBC Platelet - w/Differential (85025-CBCD) TLB-Hepatic/Liver Function Pnl (80076-HEPATIC) TLB-Lipid Panel (80061-LIPID) TLB-TSH (Thyroid Stimulating Hormone) (84443-TSH)  Problem # 2:  HYPERLIPIDEMIA (ICD-272.4) Assessment: Unchanged  His updated medication list for this problem includes:    Vytorin 10-40 Mg Tabs (Ezetimibe-simvastatin) .Marland Kitchen... 1 by mouth qd  Orders: TLB-BMP (Basic Metabolic Panel-BMET) (80048-METABOL) TLB-CBC Platelet - w/Differential (85025-CBCD) TLB-Hepatic/Liver Function Pnl (80076-HEPATIC) TLB-Lipid Panel (80061-LIPID) TLB-TSH (Thyroid Stimulating Hormone) (84443-TSH)  Problem # 3:  CORONARY ARTERY DISEASE (ICD-414.00) Assessment: Unchanged  His updated medication list for this  problem includes:    Benicar Hct 40-25 Mg Tabs (Olmesartan medoxomil-hctz) .Marland Kitchen... Take 1 tab by mouth daily    Metoprolol Tartrate 50 Mg Tabs (Metoprolol  tartrate) .Marland Kitchen... Take 1 tablet by mouth two times a day    Nitrostat 0.4 Mg Subl (Nitroglycerin) .Marland Kitchen... As needed    Aspirin 81 Mg Tabs (Aspirin) ..... Once daily    Amlodipine Besylate 10 Mg Tabs (Amlodipine besylate) .Marland Kitchen... 1 once daily    Plavix 75 Mg Tabs (Clopidogrel bisulfate) .Marland Kitchen... 1 by mouth qd  Orders: TLB-BMP (Basic Metabolic Panel-BMET) (80048-METABOL) TLB-CBC Platelet - w/Differential (85025-CBCD) TLB-Hepatic/Liver Function Pnl (80076-HEPATIC) TLB-Lipid Panel (80061-LIPID) TLB-TSH (Thyroid Stimulating Hormone) (84443-TSH)  Problem # 4:  ERECTILE DYSFUNCTION (UJW-119.14) Assessment: Unchanged On prescription therapy   Problem # 5:  INSOMNIA, PERSISTENT (ICD-307.42) Assessment: New Lorazepam prn  Complete Medication List: 1)  Vytorin 10-40 Mg Tabs (Ezetimibe-simvastatin) .Marland Kitchen.. 1 by mouth qd 2)  Benicar Hct 40-25 Mg Tabs (Olmesartan medoxomil-hctz) .... Take 1 tab by mouth daily 3)  Metoprolol Tartrate 50 Mg Tabs (Metoprolol tartrate) .... Take 1 tablet by mouth two times a day 4)  Nitrostat 0.4 Mg Subl (Nitroglycerin) .... As needed 5)  Aspirin 81 Mg Tabs (Aspirin) .... Once daily 6)  Amlodipine Besylate 10 Mg Tabs (Amlodipine besylate) .Marland Kitchen.. 1 once daily 7)  Plavix 75 Mg Tabs (Clopidogrel bisulfate) .Marland Kitchen.. 1 by mouth qd 8)  Vitamin D3 1000 Unit Tabs (Cholecalciferol) .Marland Kitchen.. 1 qd 9)  Lorazepam 0.5 Mg Tabs (Lorazepam) .Marland Kitchen.. 1 - 2  by mouth at bedtime as needed insomnia  Patient Instructions: 1)  Try to eat more raw plant food, fresh and dry fruit, raw almonds, leafy vegies, whole foods and less red meat, less animal fat. Avoid processed foods (canned soups, hot dogs, sausage , frozen dinners). Avoid corn syrup or aspartam and Splenda  containing drinks. Make your own salad dressing with olive oil, wine vinegar, garlic etc.  2)  Please schedule a follow-up appointment in 4 months. Prescriptions: AMLODIPINE BESYLATE 10 MG  TABS (AMLODIPINE BESYLATE) 1 once daily  #90 x 3   Entered  and Authorized by:   Tresa Garter MD   Signed by:   Tresa Garter MD on 02/17/2009   Method used:   Electronically to        [facility] [phone]* (retail)       [address]       [place], [place]  [address]       Ph: [phone] or [phone]       Fax: [phone]   RxID:   [id number] NITROSTAT 0.4 MG SUBL (NITROGLYCERIN) as needed  #20 x 3   Entered and Authorized by:   Tresa Garter MD   Signed by:   Tresa Garter MD on 02/17/2009   Method used:   Electronically to        [facility] [phone]* (retail)       [address]       [place], [place]  [address]       Ph: [phone] or [phone]       Fax: [phone]   RxID:   [id number] METOPROLOL TARTRATE 50 MG  TABS (METOPROLOL TARTRATE) Take 1 tablet by mouth two times a day  #60 x 12   Entered and Authorized by:   Tresa Garter MD   Signed by:  Tresa Garter MD on 02/17/2009   Method used:   [facility]ically to        [id number] (retail)       [address]       [place], [place]  [address]       Ph: [phone] or [phone]       Fax: [phone]   RxID:   [id number] BENICAR HCT 40-25 MG  TABS (OLMESARTAN MEDOXOMIL-HCTZ) Take 1 tab by mouth daily  #30 x 12   Entered and Authorized by:   Tresa Garter MD   Signed by:   Tresa Garter MD on 02/17/2009   Method used:   Electronically to        [facility] [phone]* (retail)       [address]       [place], [place]  [address]       Ph: [phone] or [phone]       Fax: [phone]   RxID:   [id number] VYTORIN 10-40 MG TABS (EZETIMIBE-SIMVASTATIN) 1 by mouth qd  #30 x 12   Entered and Authorized by:   Tresa Garter MD   Signed by:   Tresa Garter MD on 02/17/2009   Method used:   Electronically to        [id number] (retail)       [address]       [place], [place]  [address]       Ph: [phone] or [phone]       Fax: [phone]   RxID:   [id number] LORAZEPAM 0.5 MG TABS (LORAZEPAM) 1 - 2  by mouth at bedtime as needed insomnia  #60 x 3   Entered and Authorized by:   Tresa Garter MD   Signed by:   Tresa Garter MD on 02/17/2009   Method used:   Print then Give to Patient   RxID:   [id number]

## 2010-09-08 NOTE — Progress Notes (Signed)
Summary: MED UPDATE  Phone Note From Pharmacy   Summary of Call: Pt's pharm called. Pt has 2 different rx's for metoprolol. Pharm spoke w/cardiologist and they want pt to be on Toprolol 25 mg 1 once daily. Ok to update EMR?   Cardiologist is Dr Ginger Carne Initial call taken by: Lamar Sprinkles, CMA,  January 21, 2010 2:27 PM  Follow-up for Phone Call        OK Thx Follow-up by: Tresa Garter MD,  January 21, 2010 5:36 PM    New/Updated Medications: TOPROL XL 25 MG XR24H-TAB (METOPROLOL SUCCINATE) 1 once daily

## 2010-09-08 NOTE — Progress Notes (Signed)
  Phone Note Refill Request Message from:  Fax from Pharmacy on December 01, 2009 1:07 PM  Refills Requested: Medication #1:  VYTORIN 10-40 MG TABS 1 by mouth qd   Last Refilled: 09/22/2009 Initial call taken by: Rock Nephew CMA,  December 01, 2009 1:08 PM    Prescriptions: VYTORIN 10-40 MG TABS (EZETIMIBE-SIMVASTATIN) 1 by mouth qd  #30 x 3   Entered by:   Rock Nephew CMA   Authorized by:   Tresa Garter MD   Signed by:   Rock Nephew CMA on 12/01/2009   Method used:   [facility]ron[facility] to        [facility] [phone]* (retail)       [address]       [place], [place]  [address]       Ph: [phone] or [phone]       Fax: [phone]   RxID:   [id number]

## 2010-09-08 NOTE — Assessment & Plan Note (Signed)
Summary: 6 MO ROA/NML   Vital Signs:  Patient Profile:   60 Years Old Male Weight:      234.13 pounds Temp:     97.0 degrees F oral Pulse rate:   65 / minute Pulse rhythm:   regular BP sitting:   149 / 92  (right arm)  Vitals Entered By: Rock Nephew CMA (August 21, 2007 10:11 AM)                 Chief Complaint:  Multiple medical problems or concerns.  History of Present Illness: The patient presents for a follow up of hypertension, CAD, hyperlipidemia. C/o ED.   Current Allergies: No known allergies   Past Medical History:    Reviewed history from 08/01/2007 and no changes required:       Coronary artery disease       Hyperlipidemia       Hypertension       ED   Family History:    Reviewed history from 08/01/2007 and no changes required:       Family History Hypertension  Social History:    Reviewed history from 08/01/2007 and no changes required:       Occupation:       Married       Former Smoker    Review of Systems  The patient denies chest pain and syncope.     Physical Exam  General:     Well-developed,well-nourished,in no acute distress; alert,appropriate and cooperative throughout examination Head:     Normocephalic and atraumatic without obvious abnormalities. No apparent alopecia or balding. Nose:     External nasal examination shows no deformity or inflammation. Nasal mucosa are pink and moist without lesions or exudates. Mouth:     Oral mucosa and oropharynx without lesions or exudates.  Teeth in good repair. Neck:     No deformities, masses, or tenderness noted. Lungs:     Normal respiratory effort, chest expands symmetrically. Lungs are clear to auscultation, no crackles or wheezes. Heart:     Normal rate and regular rhythm. S1 and S2 normal without gallop, murmur, click, rub or other extra sounds. Abdomen:     Bowel sounds positive,abdomen soft and non-tender without masses, organomegaly or hernias noted. Msk:     No  deformity or scoliosis noted of thoracic or lumbar spine.   Neurologic:     No cranial nerve deficits noted. Station and gait are normal. Plantar reflexes are down-going bilaterally. DTRs are symmetrical throughout. Sensory, motor and coordinative functions appear intact. Skin:     Intact without suspicious lesions or rashes Psych:     Cognition and judgment appear intact. Alert and cooperative with normal attention span and concentration. No apparent delusions, illusions, hallucinations    Impression & Recommendations:  Problem # 1:  CORONARY ARTERY DISEASE (ICD-414.00) Assessment: Unchanged Refused a flu shot The following medications were removed from the medication list:    Lotrel 10-40 Mg Caps (Amlodipine besy-benazepril hcl) .Marland Kitchen... 1 by mouth qd  His updated medication list for this problem includes:    Benicar Hct 40-25 Mg Tabs (Olmesartan medoxomil-hctz) .Marland Kitchen... Take 1 tab by mouth daily    Lotrel 5-20 Mg Caps (Amlodipine besy-benazepril hcl) .Marland Kitchen... 2 by mouth qd   Problem # 2:  HYPERTENSION (ICD-401.9)  The following medications were removed from the medication list:    Lotrel 10-40 Mg Caps (Amlodipine besy-benazepril hcl) .Marland Kitchen... 1 by mouth qd  His updated medication list for this problem includes:  Benicar Hct 40-25 Mg Tabs (Olmesartan medoxomil-hctz) .Marland Kitchen... Take 1 tab by mouth daily    Lotrel 5-20 Mg Caps (Amlodipine besy-benazepril hcl) .Marland Kitchen... 2 by mouth qd   Problem # 3:  HYPERLIPIDEMIA (ICD-272.4)  His updated medication list for this problem includes:    Vytorin 10-40 Mg Tabs (Ezetimibe-simvastatin) .Marland Kitchen... 1 by mouth qd   Problem # 4:  ERECTILE DYSFUNCTION (JXB-147.82) Risks discussed. His updated medication list for this problem includes:    Levitra 20 Mg Tabs (Vardenafil hcl) .Marland Kitchen... 1/2-1 once daily as needed   Problem # 5:  ABNORMAL GLUCOSE NEC (ICD-790.29) Get A1c  Complete Medication List: 1)  Prevacid 30 Mg Cpdr (Lansoprazole) .Marland Kitchen.. 1 po qd 2)  Vytorin  10-40 Mg Tabs (Ezetimibe-simvastatin) .Marland Kitchen.. 1 by mouth qd 3)  Benicar Hct 40-25 Mg Tabs (Olmesartan medoxomil-hctz) .... Take 1 tab by mouth daily 4)  Vitamin D3 1000 Unit Tabs (Cholecalciferol) .Marland Kitchen.. 1 qd 5)  Darvocet-n 100 100-650 Mg Tabs (Propoxyphene n-apap) .Marland Kitchen.. 1 by mouth four times a day as needed 6)  Lotrel 5-20 Mg Caps (Amlodipine besy-benazepril hcl) .... 2 by mouth qd 7)  Levitra 20 Mg Tabs (Vardenafil hcl) .... 1/2-1 once daily as needed   Patient Instructions: 1)  Please schedule a follow-up appointment in 3 months. 2)  BMP prior to visit, ICD-9: 3)  Hepatic Panel prior to visit, ICD-9:414.8 995.2 790.2 4)  Lipid Panel prior to visit, ICD-9: 5)  TSH prior to visit, ICD-9: 6)  HbgA1C prior to visit, ICD-9:    Prescriptions: LEVITRA 20 MG TABS (VARDENAFIL HCL) 1/2-1 once daily as needed  #12 x 3   Entered and Authorized by:   Tresa Garter MD   Signed by:   Tresa Garter MD on 08/21/2007   Method used:   Print then Give to Patient   RxID:   [id number] DARVOCET-N 100 100-650 MG TABS (PROPOXYPHENE N-APAP) 1 by mouth four times a day as needed  #60 x 1   Entered and Authorized by:   Tresa Garter MD   Signed by:   Tresa Garter MD on 08/21/2007   Method used:   Print then Give to Patient   RxID:   [id number] BENICAR HCT 40-25 MG  TABS (OLMESARTAN MEDOXOMIL-HCTZ) Take 1 tab by mouth daily  #30 x 12   Entered and Authorized by:   Tresa Garter MD   Signed by:   Tresa Garter MD on 08/21/2007   Method used:   Print then Give to Patient   RxID:   [id number] VYTORIN 10-40 MG TABS (EZETIMIBE-SIMVASTATIN) 1 by mouth qd  #30 x 12   Entered and Authorized by:   Tresa Garter MD   Signed by:   Tresa Garter MD on 08/21/2007   Method used:   Print then Give to Patient   RxID:   [id number] PREVACID 30 MG CPDR (LANSOPRAZOLE) 1 po qd  #30 x 12   Entered and Authorized by:   Tresa Garter MD   Signed by:    Tresa Garter MD on 08/21/2007   Method used:   Print then Give to Patient   RxID:   [id number] LOTREL 5-20 MG  CAPS (AMLODIPINE BESY-BENAZEPRIL HCL) 2 by mouth qd  #60 x 12   Entered and Authorized by:   Tresa Garter MD   Signed by:   Tresa Garter MD on 08/21/2007   Method used:   Print then Give to Patient  RxID:   [id number]  ]

## 2010-09-08 NOTE — Consult Note (Signed)
Summary: [facility]   Imported By: Lennie Odor 07/14/2010 12:03:56  _____________________________________________________________________  External Attachment:    Type:   Image     Comment:   External Document

## 2010-09-08 NOTE — Assessment & Plan Note (Signed)
Summary: HOSPITAL FU/DISCHARGE 2/19/$50/PN   Vital Signs:  Patient Profile:   60 Years Old Male Weight:      233 pounds Temp:     97.3 degrees F oral Pulse rate:   68 / minute BP sitting:   180 / 100  (left arm)  Vitals Entered By: Tora Perches (October 08, 2008 10:41 AM)                 Chief Complaint:  post hosp.  History of Present Illness: The patient presents for a post-hospital visit for CAD, had STENTS placed in [place] 3 wks ago for CP. Had to be shocked during the procedure. F/u elev glu, elev. chol, HTN.     Prior Medications Reviewed Using: List Brought by Patient  Updated Prior Medication List: VYTORIN 10-40 MG TABS (EZETIMIBE-SIMVASTATIN) 1 by mouth qd BENICAR HCT 40-25 MG  TABS (OLMESARTAN MEDOXOMIL-HCTZ) Take 1 tab by mouth daily VITAMIN D3 1000 UNIT  TABS (CHOLECALCIFEROL) 1 qd LOTREL 5-20 MG  CAPS (AMLODIPINE BESY-BENAZEPRIL HCL) 2 by mouth qd METOPROLOL TARTRATE 50 MG  TABS (METOPROLOL TARTRATE) Take 1 tablet by mouth two times a day PLAVIX 300 MG TABS (CLOPIDOGREL BISULFATE) once daily NITROSTAT 0.4 MG SUBL (NITROGLYCERIN) as needed ASPIRIN 81 MG TABS (ASPIRIN) once daily LISINOPRIL 10 MG TABS (LISINOPRIL) once daily  Current Allergies: No known allergies   Past Medical History:    Reviewed history from 08/01/2007 and no changes required:       Coronary artery disease Dr Desma Maxim in [place]       Hyperlipidemia       Hypertension       ED  Past Surgical History:    STENTs 2010   Family History:    Reviewed history from 08/01/2007 and no changes required:       Family History Hypertension  Social History:    Reviewed history from 08/01/2007 and no changes required:       Occupation:       Married       Former Smoker    Review of Systems  The patient denies anorexia, fever, chest pain, and abdominal pain.     Physical Exam  General:     Well-developed,well-nourished,in no acute distress; alert,appropriate and cooperative throughout  examination Nose:     External nasal examination shows no deformity or inflammation. Nasal mucosa are pink and moist without lesions or exudates. Mouth:     Oral mucosa and oropharynx without lesions or exudates.  Teeth in good repair. Neck:     No deformities, masses, or tenderness noted. Lungs:     Normal respiratory effort, chest expands symmetrically. Lungs are clear to auscultation, no crackles or wheezes. Heart:     Normal rate and regular rhythm. S1 and S2 normal without gallop, murmur, click, rub or other extra sounds. Abdomen:     Bowel sounds positive,abdomen soft and non-tender without masses, organomegaly or hernias noted. Msk:     No deformity or scoliosis noted of thoracic or lumbar spine.   Extremities:     No clubbing, cyanosis, edema, or deformity noted with normal full range of motion of all joints.   Neurologic:     No cranial nerve deficits noted. Station and gait are normal. Plantar reflexes are down-going bilaterally. DTRs are symmetrical throughout. Sensory, motor and coordinative functions appear intact. Skin:     Intact without suspicious lesions or rashes Psych:     Cognition and judgment appear intact. Alert and cooperative with  normal attention span and concentration. No apparent delusions, illusions, hallucinations    Impression & Recommendations:  Problem # 1:  CORONARY ARTERY DISEASE (ICD-414.00) Assessment: Deteriorated S/p STENT placement The following medications were removed from the medication list:    Lotrel 5-20 Mg Caps (Amlodipine besy-benazepril hcl) .Marland Kitchen... 2 by mouth qd  His updated medication list for this problem includes:    Benicar Hct 40-25 Mg Tabs (Olmesartan medoxomil-hctz) .Marland Kitchen... Take 1 tab by mouth daily    Metoprolol Tartrate 50 Mg Tabs (Metoprolol tartrate) .Marland Kitchen... Take 1 tablet by mouth two times a day    Nitrostat 0.4 Mg Subl (Nitroglycerin) .Marland Kitchen... As needed    Aspirin 81 Mg Tabs (Aspirin) ..... Once daily    Amlodipine Besylate  10 Mg Tabs (Amlodipine besylate) .Marland Kitchen... 1 once daily    Plavix 75 Mg Tabs (Clopidogrel bisulfate) .Marland Kitchen... 1 by mouth qd   Problem # 2:  HYPERTENSION (ICD-401.9) Assessment: Deteriorated  The following medications were removed from the medication list:    Lotrel 5-20 Mg Caps (Amlodipine besy-benazepril hcl) .Marland Kitchen... 2 by mouth qd  His updated medication list for this problem includes:    Benicar Hct 40-25 Mg Tabs (Olmesartan medoxomil-hctz) .Marland Kitchen... Take 1 tab by mouth daily    Metoprolol Tartrate 50 Mg Tabs (Metoprolol tartrate) .Marland Kitchen... Take 1 tablet by mouth two times a day    Amlodipine Besylate 10 Mg Tabs (Amlodipine besylate) .Marland Kitchen... 1 once daily  BP today: 180/100 Prior BP: 149/92 (08/21/2007)  Labs Reviewed: Creat: 1.0 (02/13/2007) Chol: 199 (02/13/2007)   HDL: 35.3 (02/13/2007)   LDL: 134 (02/13/2007)   TG: 150 (02/13/2007)   Problem # 3:  HYPERLIPIDEMIA (ICD-272.4) Assessment: Comment Only  His updated medication list for this problem includes:    Vytorin 10-40 Mg Tabs (Ezetimibe-simvastatin) .Marland Kitchen... 1 by mouth qd   Problem # 4:  ABNORMAL GLUCOSE NEC (ICD-790.29) Assessment: Comment Only Watching  Complete Medication List: 1)  Vytorin 10-40 Mg Tabs (Ezetimibe-simvastatin) .Marland Kitchen.. 1 by mouth qd 2)  Benicar Hct 40-25 Mg Tabs (Olmesartan medoxomil-hctz) .... Take 1 tab by mouth daily 3)  Metoprolol Tartrate 50 Mg Tabs (Metoprolol tartrate) .... Take 1 tablet by mouth two times a day 4)  Nitrostat 0.4 Mg Subl (Nitroglycerin) .... As needed 5)  Aspirin 81 Mg Tabs (Aspirin) .... Once daily 6)  Amlodipine Besylate 10 Mg Tabs (Amlodipine besylate) .Marland Kitchen.. 1 once daily 7)  Plavix 75 Mg Tabs (Clopidogrel bisulfate) .Marland Kitchen.. 1 by mouth qd 8)  Vitamin D3 1000 Unit Tabs (Cholecalciferol) .Marland Kitchen.. 1 qd   Patient Instructions: 1)  Please schedule a follow-up appointment in 4 months. 2)  BMP prior to visit, ICD-9: 3)  Hepatic Panel prior to visit, ICD-9: 4)  Lipid Panel prior to visit, ICD-9: 5)  HbgA1C  prior to visit, ICD-9: 272.0 995.20 790.29   Prescriptions: PLAVIX 75 MG TABS (CLOPIDOGREL BISULFATE) 1 by mouth qd  #90 x 3   Entered and Authorized by:   Tresa Garter MD   Signed by:   Tresa Garter MD on 10/08/2008   Method used:   Print then Give to Patient   RxID:   [id number] AMLODIPINE BESYLATE 10 MG  TABS (AMLODIPINE BESYLATE) 1 once daily  #90 x 3   Entered and Authorized by:   Tresa Garter MD   Signed by:   Tresa Garter MD on 10/08/2008   Method used:   Print then Give to Patient   RxID:   [id number]

## 2010-09-10 NOTE — Assessment & Plan Note (Signed)
Summary: CPX/ NWS  #   Vital Signs:  Patient profile:   60 year old male Height:      69 inches Weight:      242 pounds BMI:     35.87 Temp:     98.9 degrees F oral Pulse rate:   72 / minute Pulse rhythm:   regular Resp:     16 per minute BP sitting:   140 / 92  (left arm) Cuff size:   large  Vitals Entered By: Lanier Prude, CMA(AAMA) (August 12, 2010 11:01 AM) CC: CPX Is Patient Diabetic? No Comments pt is not taking Lorazepam  or Losartan potassium.  He needs DOT papers completed for employer   CC:  CPX.  History of Present Illness: The patient presents for a preventive health examination   Current Medications (verified): 1)  Vytorin 10-40 Mg Tabs (Ezetimibe-Simvastatin) .Marland Kitchen.. 1 By Mouth Qd 2)  Toprol Xl 25 Mg Xr24h-Tab (Metoprolol Succinate) .Marland Kitchen.. 1 Once Daily 3)  Nitrostat 0.4 Mg Subl (Nitroglycerin) .... As Needed 4)  Aspirin 81 Mg Tabs (Aspirin) .... Once Daily 5)  Amlodipine Besylate 10 Mg  Tabs (Amlodipine Besylate) .Marland Kitchen.. 1 Once Daily 6)  Plavix 75 Mg Tabs (Clopidogrel Bisulfate) .Marland Kitchen.. 1 By Mouth Qd 7)  Vitamin D3 1000 Unit  Tabs (Cholecalciferol) .Marland Kitchen.. 1 Qd 8)  Lorazepam 0.5 Mg Tabs (Lorazepam) .Marland Kitchen.. 1 - 2  By Mouth At Bedtime As Needed Insomnia 9)  Losartan Potassium-Hctz 100-25 Mg Tabs (Losartan Potassium-Hctz) .... One By Mouth Once Daily For High Blood Pressure 10)  Vitamin D 1000 Unit Tabs (Cholecalciferol) .Marland Kitchen.. 1 By Mouth Qd 11)  Ibuprofen 600 Mg Tabs (Ibuprofen) .Marland Kitchen.. 1 By Mouth Bid  Pc X 1 Wk Then As Needed For  Pain  Allergies (verified): No Known Drug Allergies  Past History:  Past Medical History: Last updated: 10/08/2008 Coronary artery disease Dr Desma Maxim in [facility] Hyperlipidemia Hypertension ED  Past Surgical History: Last updated: 10/08/2008 STENTs 2010  Family History: Last updated: 08/01/2007 Family History Hypertension  Social History: Last updated: 02/17/2009 Occupation: 2nd shift Married Former Smoker  Review of Systems       The  patient complains of weight gain.  The patient denies anorexia, fever, weight loss, vision loss, decreased hearing, hoarseness, chest pain, syncope, dyspnea on exertion, peripheral edema, prolonged cough, headaches, hemoptysis, abdominal pain, melena, hematochezia, severe indigestion/heartburn, hematuria, incontinence, genital sores, muscle weakness, suspicious skin lesions, transient blindness, difficulty walking, depression, unusual weight change, abnormal bleeding, enlarged lymph nodes, angioedema, and testicular masses.    Physical Exam  General:  overweight-appearing.   Head:  3x2 cm seb cyst on L temp-occip area Eyes:  No corneal or conjunctival inflammation noted. EOMI. Perrla.  Ears:  External ear exam shows no significant lesions or deformities.  Otoscopic examination reveals clear canals, tympanic membranes are intact bilaterally without bulging, retraction, inflammation or discharge. Hearing is grossly normal bilaterally. Nose:  External nasal examination shows no deformity or inflammation. Nasal mucosa are pink and moist without lesions or exudates. Mouth:  Oral mucosa and oropharynx without lesions or exudates.  Teeth in good repair. Neck:  No deformities, masses, or tenderness noted. Lungs:  Normal respiratory effort, chest expands symmetrically. Lungs are clear to auscultation, no crackles or wheezes. Heart:  Normal rate and regular rhythm. S1 and S2 normal without gallop, murmur, click, rub or other extra sounds. Abdomen:  Bowel sounds positive,abdomen soft and non-tender without masses, organomegaly or hernias noted. Rectal:  No external abnormalities noted. Normal sphincter  tone. No rectal masses or tenderness. G(-) Genitalia:  Testes bilaterally descended without nodularity, tenderness or masses. No scrotal masses or lesions. No penis lesions or urethral discharge. Prostate:  no nodules and 1+ enlarged.   Msk:  B knees are crackling and a little tender w/ROM Extremities:  No  clubbing, cyanosis, edema, or deformity noted with normal full range of motion of all joints.   Neurologic:  No cranial nerve deficits noted. Station and gait are normal. Plantar reflexes are down-going bilaterally. DTRs are symmetrical throughout. Sensory, motor and coordinative functions appear intact. Skin:  wart like growth on face, neck Psych:  Cognition and judgment appear intact. Alert and cooperative with normal attention span and concentration. No apparent delusions, illusions, hallucinations   Impression & Recommendations:  Problem # 1:  ROUTINE GENERAL MEDICAL EXAM@HEALTH  CARE FACL (ICD-V70.0) Assessment New EKG was nl Health and age related issues were discussed. Available screening tests and vaccinations were discussed as well. Healthy life style including good diet and exercise was discussed.  Labs ordered Vaccines - he decllined  Problem # 2:  SEBACEOUS CYST, SCALP (ICD-706.2) Assessment: Unchanged Surgery is pending   Problem # 3:  NEOPLASM OF UNCERTAIN BEHAVIOR OF SKIN (ICD-238.2) Assessment: New  skin biopsy   Orders: TLB-BMP (Basic Metabolic Panel-BMET) (80048-METABOL) TLB-CBC Platelet - w/Differential (85025-CBCD) TLB-Hepatic/Liver Function Pnl (80076-HEPATIC) TLB-Lipid Panel (80061-LIPID) TLB-TSH (Thyroid Stimulating Hormone) (84443-TSH) TLB-PSA (Prostate Specific Antigen) (84153-PSA) TLB-Udip ONLY (81003-UDIP)  Problem # 4:  HYPERLIPIDEMIA (ICD-272.4)  His updated medication list for this problem includes:    Vytorin 10-40 Mg Tabs (Ezetimibe-simvastatin) .Marland Kitchen... 1 by mouth once daily RESTART  Problem # 5:  ERECTILE DYSFUNCTION (ZOX-096.04) Assessment: Unchanged  Problem # 6:  ABNORMAL GLUCOSE NEC (ICD-790.29) Assessment: Unchanged  Problem # 7:  CORONARY ARTERY DISEASE (ICD-414.00) Assessment: Unchanged Card q 12 months  His updated medication list for this problem includes:    Toprol Xl 25 Mg Xr24h-tab (Metoprolol succinate) .Marland Kitchen... 1 once daily     Nitrostat 0.4 Mg Subl (Nitroglycerin) .Marland Kitchen... As needed    Aspirin 81 Mg Tabs (Aspirin) ..... Once daily    Amlodipine Besylate 10 Mg Tabs (Amlodipine besylate) .Marland Kitchen... 1 once daily    Plavix 75 Mg Tabs (Clopidogrel bisulfate) .Marland Kitchen... 1 by mouth qd    Losartan Potassium-hctz 100-25 Mg Tabs (Losartan potassium-hctz) ..... One by mouth once daily for high blood pressure  Complete Medication List: 1)  Vytorin 10-40 Mg Tabs (Ezetimibe-simvastatin) .Marland Kitchen.. 1 by mouth qd 2)  Toprol Xl 25 Mg Xr24h-tab (Metoprolol succinate) .Marland Kitchen.. 1 once daily 3)  Nitrostat 0.4 Mg Subl (Nitroglycerin) .... As needed 4)  Aspirin 81 Mg Tabs (Aspirin) .... Once daily 5)  Amlodipine Besylate 10 Mg Tabs (Amlodipine besylate) .Marland Kitchen.. 1 once daily 6)  Plavix 75 Mg Tabs (Clopidogrel bisulfate) .Marland Kitchen.. 1 by mouth qd 7)  Vitamin D3 1000 Unit Tabs (Cholecalciferol) .Marland Kitchen.. 1 qd 8)  Lorazepam 0.5 Mg Tabs (Lorazepam) .Marland Kitchen.. 1 - 2  by mouth at bedtime as needed insomnia 9)  Losartan Potassium-hctz 100-25 Mg Tabs (Losartan potassium-hctz) .... One by mouth once daily for high blood pressure 10)  Vitamin D 1000 Unit Tabs (Cholecalciferol) .Marland Kitchen.. 1 by mouth qd 11)  Ibuprofen 600 Mg Tabs (Ibuprofen) .Marland Kitchen.. 1 by mouth bid  pc x 1 wk then as needed for  pain  Other Orders: EKG w/ Interpretation (93000)  Patient Instructions: 1)  Skin biopsy with me 2)  Please schedule a follow-up appointment in 6 months. 3)  BMP prior to visit, ICD-9:401.1  4)  Nl BP<130/85 Prescriptions: LOSARTAN POTASSIUM-HCTZ 100-25 MG TABS (LOSARTAN POTASSIUM-HCTZ) One by mouth once daily for high blood pressure  #90 x 3   Entered and Authorized by:   Tresa Garter MD   Signed by:   Tresa Garter MD on 08/12/2010   Method used:   Electronically to        [facility] [phone]* (retail)       [address]       [place], [place]  [address]       Ph: [phone] or [phone]       Fax: [phone]   RxID:   [id number]    Orders Added: 1)   EKG w/ Interpretation [93000] 2)  TLB-BMP (Basic Metabolic Panel-BMET) [80048-METABOL] 3)  TLB-CBC Platelet - w/Differential [85025-CBCD] 4)  TLB-Hepatic/Liver Function Pnl [80076-HEPATIC] 5)  TLB-Lipid Panel [80061-LIPID] 6)  TLB-TSH (Thyroid Stimulating Hormone) [84443-TSH] 7)  TLB-PSA (Prostate Specific Antigen) [84153-PSA] 8)  TLB-Udip ONLY [81003-UDIP] 9)  Est. Patient 40-64 years [99396]    Contraindications/Deferment of Procedures/Staging:    Test/Procedure: FLU VAX    Reason for deferment: patient declined     Test/Procedure: Pneumovax vaccine    Reason for deferment: patient declined

## 2011-01-20 ENCOUNTER — Ambulatory Visit (INDEPENDENT_AMBULATORY_CARE_PROVIDER_SITE_OTHER): Payer: 59 | Admitting: Internal Medicine

## 2011-01-20 ENCOUNTER — Other Ambulatory Visit (INDEPENDENT_AMBULATORY_CARE_PROVIDER_SITE_OTHER): Payer: 59

## 2011-01-20 ENCOUNTER — Encounter: Payer: Self-pay | Admitting: *Deleted

## 2011-01-20 ENCOUNTER — Ambulatory Visit (INDEPENDENT_AMBULATORY_CARE_PROVIDER_SITE_OTHER)
Admission: RE | Admit: 2011-01-20 | Discharge: 2011-01-20 | Disposition: A | Discharge status: Home / Self Care | Payer: 59 | Source: Ambulatory Visit | Attending: Internal Medicine | Admitting: Internal Medicine

## 2011-01-20 DIAGNOSIS — R7309 Other abnormal glucose: Secondary | ICD-10-CM

## 2011-01-20 DIAGNOSIS — M25569 Pain in unspecified knee: Secondary | ICD-10-CM

## 2011-01-20 DIAGNOSIS — R202 Paresthesia of skin: Secondary | ICD-10-CM

## 2011-01-20 DIAGNOSIS — R209 Unspecified disturbances of skin sensation: Secondary | ICD-10-CM

## 2011-01-20 DIAGNOSIS — M542 Cervicalgia: Secondary | ICD-10-CM

## 2011-01-20 LAB — COMPREHENSIVE METABOLIC PANEL
ALT: 15 U/L (ref 0–53)
AST: 17 U/L (ref 0–37)
Albumin: 4.1 g/dL (ref 3.5–5.2)
Alkaline Phosphatase: 125 U/L — ABNORMAL HIGH (ref 39–117)
BUN: 13 mg/dL (ref 6–23)
CO2: 30 mEq/L (ref 19–32)
Calcium: 8.8 mg/dL (ref 8.4–10.5)
Chloride: 104 mEq/L (ref 96–112)
Creatinine, Ser: 1 mg/dL (ref 0.4–1.5)
GFR: 96.02 mL/min (ref >60.00)
Glucose, Bld: 98 mg/dL (ref 70–99)
Potassium: 3.9 mEq/L (ref 3.5–5.1)
Sodium: 140 mEq/L (ref 135–145)
Total Bilirubin: 0.7 mg/dL (ref 0.3–1.2)
Total Protein: 7.7 g/dL (ref 6.0–8.3)

## 2011-01-20 LAB — CBC WITH DIFFERENTIAL/PLATELET
Basophils Absolute: 0 10*3/uL (ref 0.0–0.1)
Basophils Relative: 0.4 % (ref 0.0–3.0)
Eosinophils Absolute: 0.3 10*3/uL (ref 0.0–0.7)
Eosinophils Relative: 5.1 % — ABNORMAL HIGH (ref 0.0–5.0)
HCT: 42.4 % (ref 39.0–52.0)
Hemoglobin: 14.1 g/dL (ref 13.0–17.0)
Lymphocytes Relative: 18.4 % (ref 12.0–46.0)
Lymphs Abs: 1.2 10*3/uL (ref 0.7–4.0)
MCHC: 33.1 g/dL (ref 30.0–36.0)
MCV: 82 fl (ref 78.0–100.0)
Monocytes Absolute: 0.6 10*3/uL (ref 0.1–1.0)
Monocytes Relative: 8.7 % (ref 3.0–12.0)
Neutro Abs: 4.4 10*3/uL (ref 1.4–7.7)
Neutrophils Relative %: 67.4 % (ref 43.0–77.0)
Platelets: 166 10*3/uL (ref 150.0–400.0)
RBC: 5.18 Mil/uL (ref 4.22–5.81)
RDW: 14.4 % (ref 11.5–14.6)
WBC: 6.5 10*3/uL (ref 4.5–10.5)

## 2011-01-20 LAB — SEDIMENTATION RATE: Sed Rate: 10 mm/hr (ref 0–22)

## 2011-01-20 MED ORDER — TRAMADOL HCL 50 MG PO TABS: 50.0000 mg | ORAL_TABLET | Freq: Two times a day (BID) | ORAL | Status: AC | PRN

## 2011-01-20 MED ORDER — PREDNISONE 10 MG PO TABS: ORAL_TABLET | ORAL | Status: AC

## 2011-01-20 NOTE — Patient Instructions (Signed)
Use a splint

## 2011-01-20 NOTE — Progress Notes (Signed)
  Subjective:    Patient ID: Anthony Chapman, male    DOB: [birth year], 60 y.o.   MRN: [id number]  HPI  C/o R hand weakness x 1 wk that started after he developed R neck discomfort. R fingers tend to flex and feel awkward C/o B knees pain   Review of Systems  Constitutional: Negative for appetite change, fatigue and unexpected weight change.  HENT: Negative for nosebleeds, congestion, sore throat, sneezing, trouble swallowing and neck pain.   Eyes: Negative for itching and visual disturbance.  Respiratory: Negative for cough.   Cardiovascular: Negative for chest pain, palpitations and leg swelling.  Gastrointestinal: Negative for nausea, diarrhea, blood in stool and abdominal distention.  Genitourinary: Negative for frequency and hematuria.  Musculoskeletal: Negative for back pain, joint swelling and gait problem.       Weak r hand  Skin: Negative for rash.  Neurological: Positive for weakness (R arm). Negative for dizziness, tremors and speech difficulty.  Psychiatric/Behavioral: Negative for sleep disturbance, dysphoric mood and agitation. The patient is not nervous/anxious.        Objective:   Physical Exam  Constitutional: He is oriented to person, place, and time. He appears well-developed.  HENT:  Mouth/Throat: Oropharynx is clear and moist.  Eyes: Conjunctivae are normal. Pupils are equal, round, and reactive to light.  Neck: Normal range of motion. No JVD present. No thyromegaly present.  Cardiovascular: Normal rate, regular rhythm, normal heart sounds and intact distal pulses.  Exam reveals no gallop and no friction rub.   No murmur heard. Pulmonary/Chest: Effort normal and breath sounds normal. No respiratory distress. He has no wheezes. He has no rales. He exhibits no tenderness.  Abdominal: Soft. Bowel sounds are normal. He exhibits no distension and no mass. There is no tenderness. There is no rebound and no guarding.  Musculoskeletal: Normal range of motion. He exhibits  no edema and no tenderness.  Lymphadenopathy:    He has no cervical adenopathy.  Neurological: He is alert and oriented to person, place, and time. He has normal reflexes. No cranial nerve deficit. He exhibits normal muscle tone. Coordination normal.       R grip 5-/5 L knee w/lat swell  Skin: Skin is warm and dry. No rash noted.  Psychiatric: He has a normal mood and affect. His behavior is normal. Judgment and thought content normal.   R handed mechanic        Assessment & Plan:  Procedure Note :    Procedure :   Sonography examination   Indication: R hand weakness                    L knee pain    Equipment used: Sonosite M-Turbo with  HFL38x/13-6 MHz transducer linear probe. The images were stored in the unit and later transferred in storage.  The patient was placed in a sitting position.   This study revealed a normal L and R wrists anatomy.   L knee with mild OA and poss small defect in lat meniscus and a small L knee effusion   Impression: R wrist WNL                      L knee OA, small effusion

## 2011-01-21 LAB — VITAMIN B12: Vitamin B-12: 478 pg/mL (ref 211–911)

## 2011-01-25 ENCOUNTER — Telehealth: Payer: Self-pay | Admitting: Internal Medicine

## 2011-01-25 NOTE — Assessment & Plan Note (Signed)
He will watch sugars

## 2011-01-25 NOTE — Assessment & Plan Note (Signed)
Splint Prednisone 10 mg: take 4 tabs a day x 3 days; then 3 tabs a day x 4 days; then 2 tabs a day x 4 days, then 1 tab a day x 6 days, then stop. Take pc.  Potential benefits of a short term steroid  use as well as potential risks  and complications were explained to the patient and were aknowledged.

## 2011-01-25 NOTE — Assessment & Plan Note (Signed)
Small effusion and a small meniscal tear - lateral meiscus See Meds

## 2011-01-25 NOTE — Telephone Encounter (Signed)
Misty Stanley, please, inform patient that all labs are normal except for one liver test.  Neck xray with a lot of artritis in lower c spine Keep ROV Thx

## 2011-01-26 NOTE — Telephone Encounter (Signed)
Called pt's cell # [phone]. Pt informed

## 2011-02-04 ENCOUNTER — Other Ambulatory Visit: Payer: Self-pay | Admitting: Internal Medicine

## 2011-02-04 ENCOUNTER — Other Ambulatory Visit: Payer: Self-pay

## 2011-02-04 DIAGNOSIS — I10 Essential (primary) hypertension: Secondary | ICD-10-CM

## 2011-02-11 ENCOUNTER — Ambulatory Visit: Payer: Self-pay | Admitting: Internal Medicine

## 2011-05-29 ENCOUNTER — Other Ambulatory Visit: Payer: Self-pay | Admitting: Internal Medicine

## 2011-08-17 ENCOUNTER — Other Ambulatory Visit: Payer: Self-pay | Admitting: Internal Medicine

## 2011-08-23 ENCOUNTER — Encounter: Payer: Self-pay | Admitting: Internal Medicine

## 2011-08-23 ENCOUNTER — Other Ambulatory Visit (INDEPENDENT_AMBULATORY_CARE_PROVIDER_SITE_OTHER): Payer: 59

## 2011-08-23 ENCOUNTER — Ambulatory Visit (INDEPENDENT_AMBULATORY_CARE_PROVIDER_SITE_OTHER): Payer: 59 | Admitting: Internal Medicine

## 2011-08-23 VITALS — BP 140/92 | HR 76 | Temp 98.2°F | Resp 16 | Wt 240.0 lb

## 2011-08-23 DIAGNOSIS — I2510 Atherosclerotic heart disease of native coronary artery without angina pectoris: Secondary | ICD-10-CM

## 2011-08-23 DIAGNOSIS — M25569 Pain in unspecified knee: Secondary | ICD-10-CM

## 2011-08-23 DIAGNOSIS — I10 Essential (primary) hypertension: Secondary | ICD-10-CM

## 2011-08-23 DIAGNOSIS — R7309 Other abnormal glucose: Secondary | ICD-10-CM

## 2011-08-23 DIAGNOSIS — E785 Hyperlipidemia, unspecified: Secondary | ICD-10-CM

## 2011-08-23 LAB — CBC WITH DIFFERENTIAL/PLATELET
Basophils Absolute: 0 10*3/uL (ref 0.0–0.1)
Basophils Relative: 0.5 % (ref 0.0–3.0)
Eosinophils Absolute: 0.4 10*3/uL (ref 0.0–0.7)
Eosinophils Relative: 5.7 % — ABNORMAL HIGH (ref 0.0–5.0)
HCT: 43 % (ref 39.0–52.0)
Hemoglobin: 14.5 g/dL (ref 13.0–17.0)
Lymphocytes Relative: 19.5 % (ref 12.0–46.0)
Lymphs Abs: 1.3 10*3/uL (ref 0.7–4.0)
MCHC: 33.8 g/dL (ref 30.0–36.0)
MCV: 82.1 fl (ref 78.0–100.0)
Monocytes Absolute: 0.7 10*3/uL (ref 0.1–1.0)
Monocytes Relative: 10.6 % (ref 3.0–12.0)
Neutro Abs: 4.2 10*3/uL (ref 1.4–7.7)
Neutrophils Relative %: 63.7 % (ref 43.0–77.0)
Platelets: 180 10*3/uL (ref 150.0–400.0)
RBC: 5.23 Mil/uL (ref 4.22–5.81)
RDW: 14.4 % (ref 11.5–14.6)
WBC: 6.6 10*3/uL (ref 4.5–10.5)

## 2011-08-23 LAB — HEPATIC FUNCTION PANEL
ALT: 17 U/L (ref 0–53)
AST: 19 U/L (ref 0–37)
Albumin: 4.1 g/dL (ref 3.5–5.2)
Alkaline Phosphatase: 113 U/L (ref 39–117)
Bilirubin, Direct: 0.1 mg/dL (ref 0.0–0.3)
Total Bilirubin: 0.9 mg/dL (ref 0.3–1.2)
Total Protein: 7.8 g/dL (ref 6.0–8.3)

## 2011-08-23 LAB — BASIC METABOLIC PANEL
BUN: 10 mg/dL (ref 6–23)
CO2: 26 mEq/L (ref 19–32)
Calcium: 8.7 mg/dL (ref 8.4–10.5)
Chloride: 100 mEq/L (ref 96–112)
Creatinine, Ser: 0.8 mg/dL (ref 0.4–1.5)
GFR: 119.89 mL/min (ref >60.00)
Glucose, Bld: 95 mg/dL (ref 70–99)
Potassium: 3.3 mEq/L — ABNORMAL LOW (ref 3.5–5.1)
Sodium: 139 mEq/L (ref 135–145)

## 2011-08-23 LAB — HEMOGLOBIN A1C: Hgb A1c MFr Bld: 6.1 % (ref 4.6–6.5)

## 2011-08-23 LAB — TSH: TSH: 3.76 u[IU]/mL (ref 0.35–5.50)

## 2011-08-23 MED ORDER — CLOPIDOGREL BISULFATE 75 MG PO TABS: 75.0000 mg | ORAL_TABLET | Freq: Every day | ORAL | Status: DC

## 2011-08-23 MED ORDER — EZETIMIBE-SIMVASTATIN 10-40 MG PO TABS: 1.0000 | ORAL_TABLET | Freq: Every day | ORAL | Status: DC

## 2011-08-23 MED ORDER — AMLODIPINE BESYLATE 10 MG PO TABS: 10.0000 mg | ORAL_TABLET | Freq: Every day | ORAL | Status: DC

## 2011-08-23 MED ORDER — LOSARTAN POTASSIUM-HCTZ 100-25 MG PO TABS: 1.0000 | ORAL_TABLET | Freq: Every day | ORAL | Status: DC

## 2011-08-23 MED ORDER — IBUPROFEN 600 MG PO TABS: 600.0000 mg | ORAL_TABLET | Freq: Two times a day (BID) | ORAL | Status: DC | PRN

## 2011-08-23 MED ORDER — NITROGLYCERIN 0.4 MG SL SUBL: 0.4000 mg | SUBLINGUAL_TABLET | SUBLINGUAL | Status: DC | PRN

## 2011-08-23 MED ORDER — METOPROLOL SUCCINATE ER 25 MG PO TB24: 25.0000 mg | ORAL_TABLET | Freq: Every day | ORAL | Status: DC

## 2011-08-23 NOTE — Assessment & Plan Note (Signed)
Continue with current prescription therapy as reflected on the Med list.  

## 2011-08-23 NOTE — Progress Notes (Signed)
  Subjective:    Patient ID: Anthony Chapman, male    DOB: 1950-09-27, 61 y.o.   MRN: [id number]  HPI  C/o URI x 4 d C/o B knee pain at work 6/10 R=L The patient presents for a follow-up of  chronic hypertension, chronic dyslipidemia, CAD controlled with medicines    Review of Systems  Constitutional: Negative for appetite change, fatigue and unexpected weight change.  HENT: Positive for congestion, sore throat, postnasal drip and sinus pressure. Negative for nosebleeds, sneezing, trouble swallowing and neck pain.   Eyes: Negative for itching and visual disturbance.  Respiratory: Negative for cough.   Cardiovascular: Negative for chest pain, palpitations and leg swelling.  Gastrointestinal: Negative for nausea, diarrhea, blood in stool and abdominal distention.  Genitourinary: Negative for frequency and hematuria.  Musculoskeletal: Positive for arthralgias (knees) and gait problem. Negative for back pain and joint swelling.  Skin: Negative for rash.  Neurological: Negative for dizziness, tremors, speech difficulty and weakness.  Psychiatric/Behavioral: Negative for suicidal ideas, sleep disturbance, dysphoric mood and agitation. The patient is not nervous/anxious.        Objective:   Physical Exam  Constitutional: He is oriented to person, place, and time. He appears well-developed.  HENT:  Mouth/Throat: Oropharynx is clear and moist.       eryth throat  Eyes: Conjunctivae are normal. Pupils are equal, round, and reactive to light. Left eye exhibits no discharge.  Neck: Normal range of motion. No JVD present. No thyromegaly present.  Cardiovascular: Normal rate, regular rhythm, normal heart sounds and intact distal pulses.  Exam reveals no gallop and no friction rub.   No murmur heard. Pulmonary/Chest: Effort normal and breath sounds normal. No respiratory distress. He has no wheezes. He has no rales. He exhibits no tenderness.  Abdominal: Soft. Bowel sounds are normal. He exhibits  no distension and no mass. There is no tenderness. There is no rebound and no guarding.  Musculoskeletal: Normal range of motion. He exhibits no edema and no tenderness.  Lymphadenopathy:    He has no cervical adenopathy.  Neurological: He is alert and oriented to person, place, and time. He has normal reflexes. No cranial nerve deficit. He exhibits normal muscle tone. Coordination normal.  Skin: Skin is warm and dry. No rash noted.  Psychiatric: He has a normal mood and affect. His behavior is normal. Judgment and thought content normal.          Assessment & Plan:

## 2011-08-23 NOTE — Assessment & Plan Note (Signed)
F/u w/Cardiology Continue with current prescription therapy as reflected on the Med list.

## 2011-08-23 NOTE — Assessment & Plan Note (Signed)
B knees - chronic OA We will try Ibuprofen

## 2011-08-24 ENCOUNTER — Telehealth: Payer: Self-pay | Admitting: *Deleted

## 2011-08-24 MED ORDER — AMOXICILLIN 500 MG PO CAPS: 1000.0000 mg | ORAL_CAPSULE | Freq: Two times a day (BID) | ORAL | Status: AC

## 2011-08-24 MED ORDER — POTASSIUM CHLORIDE ER 8 MEQ PO TBCR: 8.0000 meq | EXTENDED_RELEASE_TABLET | Freq: Every day | ORAL | Status: DC

## 2011-08-24 NOTE — Telephone Encounter (Signed)
Message copied by Merrilyn Puma on Tue Aug 24, 2011  8:33 AM ------      Message from: Janeal Holmes      Created: Tue Aug 24, 2011  7:48 AM       Misty Stanley, please, inform patient that potass was abnormal. Start potass po      Thank you!

## 2011-08-24 NOTE — Telephone Encounter (Signed)
Left mess for patient to call back.  

## 2011-08-24 NOTE — Telephone Encounter (Signed)
Pt's spouse called stating that MD advised that something would be sent to pharmacy to help with cold sxs. Pharmacy does not have Rx, please advise.

## 2011-08-24 NOTE — Telephone Encounter (Signed)
Amoxicillin x 10 d - done Thx

## 2011-08-24 NOTE — Progress Notes (Signed)
Addended by: Janeal Holmes on: 08/24/2011 07:54 AM   Modules accepted: Orders

## 2011-08-24 NOTE — Telephone Encounter (Signed)
Pt advised of both Rxs and lab results 

## 2012-01-19 ENCOUNTER — Encounter: Payer: Self-pay | Admitting: Internal Medicine

## 2012-01-19 ENCOUNTER — Ambulatory Visit (INDEPENDENT_AMBULATORY_CARE_PROVIDER_SITE_OTHER): Payer: 59 | Admitting: Internal Medicine

## 2012-01-19 VITALS — BP 120/82 | HR 88 | Temp 98.5°F | Resp 16 | Wt 242.0 lb

## 2012-01-19 DIAGNOSIS — R7309 Other abnormal glucose: Secondary | ICD-10-CM

## 2012-01-19 DIAGNOSIS — E785 Hyperlipidemia, unspecified: Secondary | ICD-10-CM

## 2012-01-19 DIAGNOSIS — I2510 Atherosclerotic heart disease of native coronary artery without angina pectoris: Secondary | ICD-10-CM

## 2012-01-19 DIAGNOSIS — I10 Essential (primary) hypertension: Secondary | ICD-10-CM

## 2012-01-19 NOTE — Assessment & Plan Note (Signed)
Continue with current prescription therapy as reflected on the Med list.  

## 2012-01-19 NOTE — Progress Notes (Signed)
Patient ID: Anthony Chapman, male   DOB: 1951/08/03, 61 y.o.   MRN: [id number]  Subjective:    Patient ID: Anthony Chapman, male    DOB: [birth year], 61 y.o.   MRN: [id number]  HPI  The patient presents for a follow-up of  CAD, chronic hypertension, chronic dyslipidemia, OA controlled with medicines     Review of Systems  Constitutional: Negative for appetite change, fatigue and unexpected weight change.  HENT: Negative for nosebleeds, congestion, sore throat, sneezing, trouble swallowing and neck pain.   Eyes: Negative for itching and visual disturbance.  Respiratory: Negative for cough.   Cardiovascular: Negative for chest pain, palpitations and leg swelling.  Gastrointestinal: Negative for nausea, diarrhea, blood in stool and abdominal distention.  Genitourinary: Negative for frequency and hematuria.  Musculoskeletal: Negative for back pain, joint swelling and gait problem.       Weak r hand  Skin: Negative for rash.  Neurological: Positive for weakness (R arm). Negative for dizziness, tremors and speech difficulty.  Psychiatric/Behavioral: Negative for disturbed wake/sleep cycle, dysphoric mood and agitation. The patient is not nervous/anxious.        Objective:   Physical Exam  Constitutional: He is oriented to person, place, and time. He appears well-developed.  HENT:  Mouth/Throat: Oropharynx is clear and moist.  Eyes: Conjunctivae are normal. Pupils are equal, round, and reactive to light.  Neck: Normal range of motion. No JVD present. No thyromegaly present.  Cardiovascular: Normal rate, regular rhythm, normal heart sounds and intact distal pulses.  Exam reveals no gallop and no friction rub.   No murmur heard. Pulmonary/Chest: Effort normal and breath sounds normal. No respiratory distress. He has no wheezes. He has no rales. He exhibits no tenderness.  Abdominal: Soft. Bowel sounds are normal. He exhibits no distension and no mass. There is no tenderness. There is no  rebound and no guarding.  Musculoskeletal: Normal range of motion. He exhibits no edema and no tenderness.  Lymphadenopathy:    He has no cervical adenopathy.  Neurological: He is alert and oriented to person, place, and time. He has normal reflexes. No cranial nerve deficit. He exhibits normal muscle tone. Coordination normal.       R grip 5-/5 L knee w/lat swell  Skin: Skin is warm and dry. No rash noted.  Psychiatric: He has a normal mood and affect. His behavior is normal. 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

## 2012-01-19 NOTE — Assessment & Plan Note (Signed)
cardiol f/u and labs q 6 mo Continue with current prescription therapy as reflected on the Med list.

## 2012-01-19 NOTE — Assessment & Plan Note (Signed)
CBG 

## 2012-03-06 ENCOUNTER — Other Ambulatory Visit: Payer: Self-pay | Admitting: Internal Medicine

## 2012-07-25 ENCOUNTER — Encounter: Payer: Self-pay | Admitting: Internal Medicine

## 2012-07-25 ENCOUNTER — Other Ambulatory Visit (INDEPENDENT_AMBULATORY_CARE_PROVIDER_SITE_OTHER): Payer: 59

## 2012-07-25 ENCOUNTER — Ambulatory Visit (INDEPENDENT_AMBULATORY_CARE_PROVIDER_SITE_OTHER): Payer: 59 | Admitting: Internal Medicine

## 2012-07-25 VITALS — BP 122/72 | HR 76 | Temp 98.3°F | Resp 16 | Ht 69.0 in | Wt 243.0 lb

## 2012-07-25 DIAGNOSIS — Z23 Encounter for immunization: Secondary | ICD-10-CM

## 2012-07-25 DIAGNOSIS — I10 Essential (primary) hypertension: Secondary | ICD-10-CM

## 2012-07-25 DIAGNOSIS — R739 Hyperglycemia, unspecified: Secondary | ICD-10-CM

## 2012-07-25 DIAGNOSIS — H6120 Impacted cerumen, unspecified ear: Secondary | ICD-10-CM

## 2012-07-25 DIAGNOSIS — E785 Hyperlipidemia, unspecified: Secondary | ICD-10-CM

## 2012-07-25 DIAGNOSIS — R7309 Other abnormal glucose: Secondary | ICD-10-CM

## 2012-07-25 DIAGNOSIS — H547 Unspecified visual loss: Secondary | ICD-10-CM

## 2012-07-25 DIAGNOSIS — Z0000 Encounter for general adult medical examination without abnormal findings: Secondary | ICD-10-CM

## 2012-07-25 LAB — CBC WITH DIFFERENTIAL/PLATELET
Basophils Absolute: 0 10*3/uL (ref 0.0–0.1)
Basophils Relative: 0.5 % (ref 0.0–3.0)
Eosinophils Absolute: 0.3 10*3/uL (ref 0.0–0.7)
Eosinophils Relative: 5.6 % — ABNORMAL HIGH (ref 0.0–5.0)
HCT: 41.5 % (ref 39.0–52.0)
Hemoglobin: 13.6 g/dL (ref 13.0–17.0)
Lymphocytes Relative: 22.3 % (ref 12.0–46.0)
Lymphs Abs: 1.3 10*3/uL (ref 0.7–4.0)
MCHC: 32.8 g/dL (ref 30.0–36.0)
MCV: 80.2 fl (ref 78.0–100.0)
Monocytes Absolute: 0.7 10*3/uL (ref 0.1–1.0)
Monocytes Relative: 11.1 % (ref 3.0–12.0)
Neutro Abs: 3.6 10*3/uL (ref 1.4–7.7)
Neutrophils Relative %: 60.5 % (ref 43.0–77.0)
Platelets: 175 10*3/uL (ref 150.0–400.0)
RBC: 5.18 Mil/uL (ref 4.22–5.81)
RDW: 14.9 % — ABNORMAL HIGH (ref 11.5–14.6)
WBC: 5.9 10*3/uL (ref 4.5–10.5)

## 2012-07-25 LAB — URINALYSIS
Bilirubin Urine: NEGATIVE
Hgb urine dipstick: NEGATIVE
Ketones, ur: NEGATIVE
Leukocytes, UA: NEGATIVE
Nitrite: NEGATIVE
Specific Gravity, Urine: 1.025 (ref 1.000–1.030)
Total Protein, Urine: NEGATIVE
Urine Glucose: NEGATIVE
Urobilinogen, UA: 0.2 (ref 0.0–1.0)
pH: 6 (ref 5.0–8.0)

## 2012-07-25 LAB — HEPATIC FUNCTION PANEL
ALT: 14 U/L (ref 0–53)
AST: 19 U/L (ref 0–37)
Albumin: 3.8 g/dL (ref 3.5–5.2)
Alkaline Phosphatase: 118 U/L — ABNORMAL HIGH (ref 39–117)
Bilirubin, Direct: 0.1 mg/dL (ref 0.0–0.3)
Total Bilirubin: 0.8 mg/dL (ref 0.3–1.2)
Total Protein: 7.2 g/dL (ref 6.0–8.3)

## 2012-07-25 LAB — LIPID PANEL
Cholesterol: 258 mg/dL — ABNORMAL HIGH (ref 0–200)
HDL: 33.3 mg/dL — ABNORMAL LOW
Total CHOL/HDL Ratio: 8
Triglycerides: 162 mg/dL — ABNORMAL HIGH (ref 0.0–149.0)
VLDL: 32.4 mg/dL (ref 0.0–40.0)

## 2012-07-25 LAB — TSH: TSH: 3.03 u[IU]/mL (ref 0.35–5.50)

## 2012-07-25 LAB — HEMOGLOBIN A1C: Hgb A1c MFr Bld: 6.4 % (ref 4.6–6.5)

## 2012-07-25 LAB — BASIC METABOLIC PANEL WITH GFR
BUN: 18 mg/dL (ref 6–23)
CO2: 30 meq/L (ref 19–32)
Calcium: 9.1 mg/dL (ref 8.4–10.5)
Chloride: 103 meq/L (ref 96–112)
Creatinine, Ser: 1 mg/dL (ref 0.4–1.5)
GFR: 103.7 mL/min
Glucose, Bld: 127 mg/dL — ABNORMAL HIGH (ref 70–99)
Potassium: 4 meq/L (ref 3.5–5.1)
Sodium: 139 meq/L (ref 135–145)

## 2012-07-25 LAB — PSA: PSA: 2 ng/mL (ref 0.10–4.00)

## 2012-07-25 LAB — LDL CHOLESTEROL, DIRECT: Direct LDL: 199.5 mg/dL

## 2012-07-25 MED ORDER — LOSARTAN POTASSIUM-HCTZ 100-25 MG PO TABS: 1.0000 | ORAL_TABLET | Freq: Every day | ORAL | Status: DC

## 2012-07-25 MED ORDER — EZETIMIBE-SIMVASTATIN 10-40 MG PO TABS: 1.0000 | ORAL_TABLET | Freq: Every day | ORAL | Status: DC

## 2012-07-25 MED ORDER — METOPROLOL SUCCINATE ER 25 MG PO TB24: 25.0000 mg | ORAL_TABLET | Freq: Every day | ORAL | Status: DC

## 2012-07-25 MED ORDER — NITROGLYCERIN 0.4 MG SL SUBL: 0.4000 mg | SUBLINGUAL_TABLET | SUBLINGUAL | Status: DC | PRN

## 2012-07-25 MED ORDER — POTASSIUM CHLORIDE ER 8 MEQ PO TBCR: 8.0000 meq | EXTENDED_RELEASE_TABLET | Freq: Every day | ORAL | Status: DC

## 2012-07-25 MED ORDER — AMLODIPINE BESYLATE 10 MG PO TABS: 10.0000 mg | ORAL_TABLET | Freq: Every day | ORAL | Status: DC

## 2012-07-25 MED ORDER — CLOPIDOGREL BISULFATE 75 MG PO TABS: 75.0000 mg | ORAL_TABLET | Freq: Every day | ORAL | Status: DC

## 2012-07-25 NOTE — Assessment & Plan Note (Signed)
R -- irrigated

## 2012-07-25 NOTE — Assessment & Plan Note (Signed)
Continue with current prescription therapy as reflected on the Med list.  

## 2012-07-25 NOTE — Progress Notes (Signed)
Subjective:    Patient ID: Anthony Chapman, male    DOB: 07/14/1951, 61 y.o.   MRN: [id number]  HPI  The patient is here for a wellness exam. The patient has been doing well overall without major physical or psychological issues going on lately. The patient needs to address  Chronic CAD, chronic hypertension, chronic dyslipidemia, OA controlled with medicines  Wt Readings from Last 3 Encounters:  07/25/12 243 lb (110.224 kg)  01/19/12 242 lb (109.77 kg)  08/23/11 240 lb (108.863 kg)   BP Readings from Last 3 Encounters:  07/25/12 122/72  01/19/12 120/82  08/23/11 140/92       Review of Systems  Constitutional: Negative for appetite change, fatigue and unexpected weight change.  HENT: Negative for nosebleeds, congestion, sore throat, sneezing, trouble swallowing and neck pain.   Eyes: Negative for itching and visual disturbance.  Respiratory: Negative for cough.   Cardiovascular: Negative for chest pain, palpitations and leg swelling.  Gastrointestinal: Negative for nausea, diarrhea, blood in stool and abdominal distention.  Genitourinary: Negative for frequency and hematuria.  Musculoskeletal: Negative for back pain, joint swelling and gait problem.       Weak r hand  Skin: Negative for rash.  Neurological: Positive for weakness (R arm). Negative for dizziness, tremors and speech difficulty.  Psychiatric/Behavioral: Negative for sleep disturbance, dysphoric mood and agitation. The patient is not nervous/anxious.        Objective:   Physical Exam  Constitutional: He is oriented to person, place, and time. He appears well-developed.  HENT:  Mouth/Throat: Oropharynx is clear and moist.  Eyes: Conjunctivae normal are normal. Pupils are equal, round, and reactive to light.  Neck: Normal range of motion. No JVD present. No thyromegaly present.  Cardiovascular: Normal rate, regular rhythm, normal heart sounds and intact distal pulses.  Exam reveals no gallop and no friction  rub.   No murmur heard. Pulmonary/Chest: Effort normal and breath sounds normal. No respiratory distress. He has no wheezes. He has no rales. He exhibits no tenderness.  Abdominal: Soft. Bowel sounds are normal. He exhibits no distension and no mass. There is no tenderness. There is no rebound and no guarding.  Musculoskeletal: Normal range of motion. He exhibits no edema and no tenderness.  Lymphadenopathy:    He has no cervical adenopathy.  Neurological: He is alert and oriented to person, place, and time. He has normal reflexes. No cranial nerve deficit. He exhibits normal muscle tone. Coordination normal.       R grip 5-/5 L knee w/lat swell  Skin: Skin is warm and dry. No rash noted.  Psychiatric: He has a normal mood and affect. His behavior is normal. Judgment and thought content normal.  R ear wax R handed mechanic     Lab Results  Component Value Date   WBC 6.6 08/23/2011   HGB 14.5 08/23/2011   HCT 43.0 08/23/2011   PLT 180.0 08/23/2011   GLUCOSE 95 08/23/2011   CHOL 272* 08/12/2010   TRIG 129.0 08/12/2010   HDL 34.90* 08/12/2010   LDLDIRECT 224.8 08/12/2010   LDLCALC 135* 02/17/2009   ALT 17 08/23/2011   AST 19 08/23/2011   NA 139 08/23/2011   K 3.3* 08/23/2011   CL 100 08/23/2011   CREATININE 0.8 08/23/2011   BUN 10 08/23/2011   CO2 26 08/23/2011   TSH 3.76 08/23/2011   PSA 1.84 08/12/2010   HGBA1C 6.1 08/23/2011    Procedure Note :     Procedure :  Ear irrigation   Indication:  Cerumen impaction R   Risks, including pain, dizziness, eardrum perforation, bleeding, infection and others as well as benefits were explained to the patient in detail. Verbal consent was obtained and the patient agreed to proceed.    We used "[employer] Device" field with lukewarm water for irrigation. A large amount wax was recovered. Procedure has also required manual wax removal with an ear loop.   Tolerated well. Complications: None.   Postprocedure instructions :  Call if  problems.    Assessment & Plan:

## 2012-07-25 NOTE — Assessment & Plan Note (Signed)
Ophth ref 

## 2012-07-25 NOTE — Assessment & Plan Note (Signed)
We discussed age appropriate health related issues, including available/recomended screening tests and vaccinations. We discussed a need for adhering to healthy diet and exercise. Labs/EKG were reviewed/ordered. All questions were answered.   

## 2012-08-01 ENCOUNTER — Encounter: Payer: Self-pay | Admitting: Internal Medicine

## 2012-08-30 ENCOUNTER — Telehealth: Payer: Self-pay | Admitting: *Deleted

## 2012-08-30 DIAGNOSIS — L723 Sebaceous cyst: Secondary | ICD-10-CM

## 2012-08-30 NOTE — Telephone Encounter (Signed)
PATIENT CALLED TO ASK ABOUT REFERRAL FOR KNOT ON HEAD THAT WAS DISCUSSED WITH DR. Posey Rea AT LAST OV. PLEASE ADVISE. CB #[phone]

## 2012-08-31 NOTE — Telephone Encounter (Signed)
Left detailed mess informing pt of below.  

## 2012-08-31 NOTE — Telephone Encounter (Signed)
Done. Thx.

## 2012-09-18 ENCOUNTER — Encounter (INDEPENDENT_AMBULATORY_CARE_PROVIDER_SITE_OTHER): Payer: Self-pay

## 2012-09-18 ENCOUNTER — Encounter (INDEPENDENT_AMBULATORY_CARE_PROVIDER_SITE_OTHER): Payer: Self-pay | Admitting: General Surgery

## 2012-09-18 ENCOUNTER — Ambulatory Visit (INDEPENDENT_AMBULATORY_CARE_PROVIDER_SITE_OTHER): Payer: 59 | Admitting: General Surgery

## 2012-09-18 VITALS — BP 148/86 | HR 60 | Temp 98.2°F | Resp 18 | Ht 69.0 in | Wt 242.6 lb

## 2012-09-18 DIAGNOSIS — L723 Sebaceous cyst: Secondary | ICD-10-CM

## 2012-09-18 DIAGNOSIS — L729 Follicular cyst of the skin and subcutaneous tissue, unspecified: Secondary | ICD-10-CM

## 2012-09-18 NOTE — Progress Notes (Signed)
Patient ID: Anthony Chapman, male   DOB: 01-14-1951, 62 y.o.   MRN: [id number]  No chief complaint on file.   HPI Anthony Chapman is a 62 y.o. male.   HPI  He is referred by Dr. Posey Rea for further evaluation and treatment of a enlarging scalp mass.  The mass has been present for many years and has been slowly getting larger. It does itch sometimes. No bleeding, drainage, or history of infection. He does take aspirin and Plavix following placement of a cardiac stent in the past.  Past Medical History  Diagnosis Date  . CAD (coronary artery disease)     Dr McGudin in [place]  . Hyperlipemia   . HTN (hypertension)   . ED (erectile dysfunction)   . Arthritis     Past Surgical History  Procedure Laterality Date  . Stents  2010    Family History  Problem Relation Age of Onset  . Hypertension    . Hypertension Mother   . Hypertension Father   . Arthritis Father     Social History History  Substance Use Topics  . Smoking status: Former Games developer  . Smokeless tobacco: Not on file  . Alcohol Use: No    No Known Allergies  Current Outpatient Prescriptions  Medication Sig Dispense Refill  . amLODipine (NORVASC) 10 MG tablet Take 1 tablet (10 mg total) by mouth daily.  30 tablet  11  . aspirin 81 MG EC tablet Take 81 mg by mouth daily.        . Cholecalciferol 1000 UNITS tablet Take 1,000 Units by mouth daily.        . clopidogrel (PLAVIX) 75 MG tablet Take 1 tablet (75 mg total) by mouth daily.  30 tablet  11  . ezetimibe-simvastatin (VYTORIN) 10-40 MG per tablet Take 1 tablet by mouth at bedtime.  30 tablet  11  . ibuprofen (ADVIL,MOTRIN) 600 MG tablet Take 1 tablet (600 mg total) by mouth 2 (two) times daily as needed for pain.  60 tablet  5  . losartan-hydrochlorothiazide (HYZAAR) 100-25 MG per tablet Take 1 tablet by mouth daily. For high blood pressure  30 tablet  11  . metoprolol succinate (TOPROL-XL) 25 MG 24 hr tablet Take 1 tablet (25 mg total) by mouth daily.  30 tablet  11   . nitroGLYCERIN (NITROSTAT) 0.4 MG SL tablet Place 1 tablet (0.4 mg total) under the tongue every 5 (five) minutes as needed for chest pain.  25 tablet  1  . potassium chloride (KLOR-CON) 8 MEQ tablet Take 1 tablet (8 mEq total) by mouth daily.  30 tablet  11   No current facility-administered medications for this visit.    Review of Systems Review of Systems  Respiratory: Negative.   Cardiovascular: Negative.     There were no vitals taken for this visit.  Physical Exam Physical Exam  Constitutional: No distress.  Overweight male.  HENT:  5 cm x 5 cm parietal scalp subcutaneous soft tissue mass. No erythema. No tenderness. It is somewhat mobile.  Lymphadenopathy:    He has no cervical adenopathy.    Data Reviewed Dr. Loren Racer note.  Assessment    Enlarging scalp soft tissue mass. 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     Plan    We discussed outpatient removal of the mass under local anesthesia. I went over the procedure and risks. The risks include but are not limited to bleeding, infection, voiding problems, reaction to local anesthetic. We will  contact his cardiologist, Dr. Gatha Mayer., to see if we can hold the aspirin and Plavix for at least 5 days prior to the procedure.        Kenetra Hildenbrand J 09/18/2012, 2:39 PM

## 2012-09-18 NOTE — Patient Instructions (Signed)
Will we talk with Dr. Desma Maxim about stopping your aspirin and Plavix.  We will then call you to schedule the procedure.

## 2012-09-20 ENCOUNTER — Other Ambulatory Visit (INDEPENDENT_AMBULATORY_CARE_PROVIDER_SITE_OTHER): Payer: Self-pay | Admitting: General Surgery

## 2012-09-20 ENCOUNTER — Telehealth (INDEPENDENT_AMBULATORY_CARE_PROVIDER_SITE_OTHER): Payer: Self-pay

## 2012-09-20 NOTE — Telephone Encounter (Signed)
LMOV pt has clearance for surgery.  He will need to stop taking his Plavix 5 days prior to surgery date.

## 2012-09-20 NOTE — Telephone Encounter (Signed)
Pt notified of pre-op instructions to stop Plavix and ASA 5 days prior to surgery.

## 2012-10-03 ENCOUNTER — Encounter (INDEPENDENT_AMBULATORY_CARE_PROVIDER_SITE_OTHER): Payer: Self-pay

## 2012-10-26 ENCOUNTER — Other Ambulatory Visit: Payer: Self-pay | Admitting: Internal Medicine

## 2013-01-22 ENCOUNTER — Ambulatory Visit: Payer: 59 | Admitting: Internal Medicine

## 2013-01-22 DIAGNOSIS — Z0289 Encounter for other administrative examinations: Secondary | ICD-10-CM

## 2013-03-06 ENCOUNTER — Encounter: Payer: Self-pay | Admitting: Internal Medicine

## 2013-03-06 ENCOUNTER — Ambulatory Visit (INDEPENDENT_AMBULATORY_CARE_PROVIDER_SITE_OTHER): Payer: 59 | Admitting: Internal Medicine

## 2013-03-06 ENCOUNTER — Ambulatory Visit (INDEPENDENT_AMBULATORY_CARE_PROVIDER_SITE_OTHER)
Admission: RE | Admit: 2013-03-06 | Discharge: 2013-03-06 | Disposition: A | Discharge status: Home / Self Care | Payer: 59 | Source: Ambulatory Visit | Attending: Internal Medicine | Admitting: Internal Medicine

## 2013-03-06 DIAGNOSIS — S139XXA Sprain of joints and ligaments of unspecified parts of neck, initial encounter: Secondary | ICD-10-CM

## 2013-03-06 DIAGNOSIS — IMO0002 Reserved for concepts with insufficient information to code with codable children: Secondary | ICD-10-CM | POA: Insufficient documentation

## 2013-03-06 DIAGNOSIS — M25519 Pain in unspecified shoulder: Secondary | ICD-10-CM | POA: Insufficient documentation

## 2013-03-06 DIAGNOSIS — M25511 Pain in right shoulder: Secondary | ICD-10-CM

## 2013-03-06 MED ORDER — TRAMADOL HCL 50 MG PO TABS: 50.0000 mg | ORAL_TABLET | Freq: Two times a day (BID) | ORAL | Status: DC | PRN

## 2013-03-06 NOTE — Assessment & Plan Note (Signed)
02/28/13 R

## 2013-03-06 NOTE — Assessment & Plan Note (Signed)
02/28/13 
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
## 2013-03-06 NOTE — Assessment & Plan Note (Signed)
R Tramadol prn

## 2013-03-08 ENCOUNTER — Encounter: Payer: Self-pay | Admitting: Gastroenterology

## 2013-03-08 ENCOUNTER — Encounter: Payer: Self-pay | Admitting: Internal Medicine

## 2013-03-13 ENCOUNTER — Encounter: Payer: Self-pay | Admitting: Internal Medicine

## 2013-03-13 ENCOUNTER — Ambulatory Visit (INDEPENDENT_AMBULATORY_CARE_PROVIDER_SITE_OTHER): Payer: 59 | Admitting: Internal Medicine

## 2013-03-13 DIAGNOSIS — IMO0002 Reserved for concepts with insufficient information to code with codable children: Secondary | ICD-10-CM

## 2013-03-13 DIAGNOSIS — I10 Essential (primary) hypertension: Secondary | ICD-10-CM

## 2013-03-13 DIAGNOSIS — M25519 Pain in unspecified shoulder: Secondary | ICD-10-CM

## 2013-03-13 DIAGNOSIS — S139XXA Sprain of joints and ligaments of unspecified parts of neck, initial encounter: Secondary | ICD-10-CM

## 2013-03-13 DIAGNOSIS — M25512 Pain in left shoulder: Secondary | ICD-10-CM

## 2013-03-13 NOTE — Assessment & Plan Note (Signed)
Better  Continue with current prescription therapy as reflected on the Med list. My need PT

## 2013-03-13 NOTE — Assessment & Plan Note (Addendum)
R MSK strain Better

## 2013-03-13 NOTE — Assessment & Plan Note (Signed)
OK to go back to work.
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
## 2013-03-13 NOTE — Assessment & Plan Note (Signed)
Continue with current prescription therapy as reflected on the Med list.  

## 2013-03-14 ENCOUNTER — Telehealth: Payer: Self-pay | Admitting: *Deleted

## 2013-03-14 DIAGNOSIS — Z0289 Encounter for other administrative examinations: Secondary | ICD-10-CM

## 2013-03-14 NOTE — Telephone Encounter (Signed)
Done. Thx.

## 2013-03-14 NOTE — Telephone Encounter (Signed)
Pt called requesting status of FMLA forms left at office yesterday.  Pt states he can not return to work with out these forms.  Please advise

## 2013-03-15 ENCOUNTER — Telehealth: Payer: Self-pay | Admitting: *Deleted

## 2013-03-15 NOTE — Telephone Encounter (Signed)
Notified pt forms are complete

## 2013-03-15 NOTE — Telephone Encounter (Signed)
Left mess for patient to call back. Papers are on my desk waiting for pt.

## 2013-06-14 ENCOUNTER — Other Ambulatory Visit: Payer: Self-pay

## 2013-08-22 ENCOUNTER — Other Ambulatory Visit: Payer: Self-pay | Admitting: Internal Medicine

## 2013-08-23 ENCOUNTER — Other Ambulatory Visit: Payer: Self-pay | Admitting: Internal Medicine

## 2013-09-09 HISTORY — PX: CARDIAC CATHETERIZATION: SHX172

## 2013-09-20 ENCOUNTER — Encounter: Payer: Self-pay | Admitting: Internal Medicine

## 2013-09-20 ENCOUNTER — Encounter: Payer: 59 | Admitting: Internal Medicine

## 2013-09-20 DIAGNOSIS — Z0289 Encounter for other administrative examinations: Secondary | ICD-10-CM

## 2013-09-20 NOTE — Progress Notes (Signed)
Patient ID: Anthony Chapman, male   DOB: 09-03-1950, 63 y.o.   MRN: [id number]

## 2013-09-24 ENCOUNTER — Encounter: Payer: Self-pay | Admitting: Internal Medicine

## 2013-09-24 ENCOUNTER — Ambulatory Visit (INDEPENDENT_AMBULATORY_CARE_PROVIDER_SITE_OTHER): Payer: 59 | Admitting: Internal Medicine

## 2013-09-24 VITALS — BP 140/72 | HR 80 | Temp 98.1°F | Resp 16 | Wt 242.0 lb

## 2013-09-24 DIAGNOSIS — E785 Hyperlipidemia, unspecified: Secondary | ICD-10-CM

## 2013-09-24 DIAGNOSIS — I10 Essential (primary) hypertension: Secondary | ICD-10-CM

## 2013-09-24 DIAGNOSIS — I2510 Atherosclerotic heart disease of native coronary artery without angina pectoris: Secondary | ICD-10-CM

## 2013-09-24 DIAGNOSIS — R0989 Other specified symptoms and signs involving the circulatory and respiratory systems: Secondary | ICD-10-CM

## 2013-09-24 DIAGNOSIS — R0683 Snoring: Secondary | ICD-10-CM | POA: Insufficient documentation

## 2013-09-24 DIAGNOSIS — R0609 Other forms of dyspnea: Secondary | ICD-10-CM

## 2013-09-24 DIAGNOSIS — R7309 Other abnormal glucose: Secondary | ICD-10-CM

## 2013-09-24 MED ORDER — AMLODIPINE BESYLATE 10 MG PO TABS: 10.0000 mg | ORAL_TABLET | Freq: Every day | ORAL | Status: DC

## 2013-09-24 MED ORDER — CARVEDILOL 25 MG PO TABS: 25.0000 mg | ORAL_TABLET | Freq: Two times a day (BID) | ORAL | Status: DC

## 2013-09-24 NOTE — Assessment & Plan Note (Signed)
Wt loss  

## 2013-09-24 NOTE — Assessment & Plan Note (Signed)
Continue with current prescription therapy as reflected on the Med list.  

## 2013-09-24 NOTE — Assessment & Plan Note (Signed)
R/o OSA Sleep test

## 2013-09-24 NOTE — Assessment & Plan Note (Signed)
STENTx2 in 2/15 Continue with current prescription therapy as reflected on the Med list.

## 2013-09-24 NOTE — Progress Notes (Signed)
Pre visit review using our clinic review tool, if applicable. No additional management support is needed unless otherwise documented below in the visit note. 

## 2013-09-24 NOTE — Progress Notes (Signed)
Subjective:    HPI  Hospital f/u [facility] (d/c Feb 4th) for CAD/MI - he had a STENT x2  C/o snoring  The patient needs to address chronic CAD, chronic hypertension, chronic dyslipidemia, OA controlled with medicines  Wt Readings from Last 3 Encounters:  09/24/13 242 lb (109.77 kg)  03/13/13 241 lb 8 oz (109.544 kg)  03/06/13 241 lb (109.317 kg)   BP Readings from Last 3 Encounters:  09/24/13 140/72  03/13/13 158/92  03/06/13 130/82       Review of Systems  Constitutional: Negative for appetite change, fatigue and unexpected weight change.  HENT: Negative for congestion, nosebleeds, sneezing, sore throat and trouble swallowing.   Eyes: Negative for itching and visual disturbance.  Respiratory: Negative for cough.   Cardiovascular: Negative for chest pain, palpitations and leg swelling.  Gastrointestinal: Negative for nausea, diarrhea, blood in stool and abdominal distention.  Genitourinary: Negative for frequency and hematuria.  Musculoskeletal: Negative for back pain, gait problem, joint swelling and neck pain.       Weak r hand  Skin: Negative for rash.  Neurological: Positive for weakness (R arm). Negative for dizziness, tremors and speech difficulty.  Psychiatric/Behavioral: Negative for sleep disturbance, dysphoric mood and agitation. The patient is not nervous/anxious.        Objective:   Physical Exam  Constitutional: He is oriented to person, place, and time. He appears well-developed.  HENT:  Mouth/Throat: Oropharynx is clear and moist.  Eyes: Conjunctivae are normal. Pupils are equal, round, and reactive to light.  Neck: Normal range of motion. No JVD present. No thyromegaly present.  Cardiovascular: Normal rate, regular rhythm, normal heart sounds and intact distal pulses.  Exam reveals no gallop and no friction rub.   No murmur heard. Pulmonary/Chest: Effort normal and breath sounds normal. No respiratory distress. He has no wheezes. He has no  rales. He exhibits no tenderness.  Abdominal: Soft. Bowel sounds are normal. He exhibits no distension and no mass. There is no tenderness. There is no rebound and no guarding.  Musculoskeletal: Normal range of motion. He exhibits no edema and no tenderness.  Lymphadenopathy:    He has no cervical adenopathy.  Neurological: He is alert and oriented to person, place, and time. He has normal reflexes. No cranial nerve deficit. He exhibits normal muscle tone. Coordination normal.  R grip 5-/5 L knee w/lat swell  Skin: Skin is warm and dry. No rash noted.  Psychiatric: He has a normal mood and affect. His behavior is normal. Judgment and thought content normal.   R handed mechanic     Lab Results  Component Value Date   WBC 5.9 07/25/2012   HGB 13.6 07/25/2012   HCT 41.5 07/25/2012   PLT 175.0 07/25/2012   GLUCOSE 127* 07/25/2012   CHOL 258* 07/25/2012   TRIG 162.0* 07/25/2012   HDL 33.30* 07/25/2012   LDLDIRECT 199.5 07/25/2012   LDLCALC 135* 02/17/2009   ALT 14 07/25/2012   AST 19 07/25/2012   NA 139 07/25/2012   K 4.0 07/25/2012   CL 103 07/25/2012   CREATININE 1.0 07/25/2012   BUN 18 07/25/2012   CO2 30 07/25/2012   TSH 3.03 07/25/2012   PSA 2.00 07/25/2012   HGBA1C 6.4 07/25/2012    Procedure Note :     Procedure :  Ear irrigation   Indication:  Cerumen impaction R   Risks, including pain, dizziness, eardrum perforation, bleeding, infection and others as well as benefits were explained to the patient  in detail. Verbal consent was obtained and the patient agreed to proceed.    We used "[employer]" field with lukewarm water for irrigation. A large amount wax was recovered. Procedure has also required manual wax removal with an ear loop.   Tolerated well. Complications: None.   Postprocedure instructions :  Call if problems.    Assessment & Plan:

## 2013-10-03 ENCOUNTER — Telehealth: Payer: Self-pay | Admitting: *Deleted

## 2013-10-03 DIAGNOSIS — R0683 Snoring: Secondary | ICD-10-CM

## 2013-10-03 NOTE — Telephone Encounter (Signed)
Patient phoned inquiring about his sleep apnea?  Sleep study?  Please advise.  CB# [phone]

## 2013-10-03 NOTE — Telephone Encounter (Signed)
They will contact him Thx

## 2013-10-30 ENCOUNTER — Encounter: Payer: Self-pay | Admitting: Gastroenterology

## 2013-12-24 ENCOUNTER — Ambulatory Visit: Payer: 59 | Admitting: Internal Medicine

## 2013-12-24 DIAGNOSIS — Z0289 Encounter for other administrative examinations: Secondary | ICD-10-CM

## 2014-01-18 ENCOUNTER — Other Ambulatory Visit: Payer: Self-pay | Admitting: Internal Medicine

## 2014-01-25 ENCOUNTER — Telehealth: Payer: Self-pay | Admitting: Internal Medicine

## 2014-01-25 ENCOUNTER — Other Ambulatory Visit: Payer: Self-pay | Admitting: Internal Medicine

## 2014-01-25 NOTE — Telephone Encounter (Signed)
Pls d/c Toprol Cont Coreg Thx

## 2014-01-25 NOTE — Telephone Encounter (Signed)
Please advise 

## 2014-01-25 NOTE — Telephone Encounter (Signed)
Patient's [facility] is calling in regards to the patient's rx they just received e-scribed: metoprolol succinate (TOPROL-XL) 50 MG. Patient just received a script for carvedilol (COREG) 25 MG tablet last month and they want to make sure he is not taking both of these together. Please advise.

## 2014-01-28 NOTE — Telephone Encounter (Signed)
Pharmacy informed of below.  

## 2014-05-24 ENCOUNTER — Other Ambulatory Visit: Payer: Self-pay

## 2014-10-07 ENCOUNTER — Other Ambulatory Visit: Payer: Self-pay | Admitting: *Deleted

## 2014-10-07 MED ORDER — CARVEDILOL 25 MG PO TABS: 25.0000 mg | ORAL_TABLET | Freq: Two times a day (BID) | ORAL | Status: DC

## 2014-10-08 ENCOUNTER — Telehealth: Payer: Self-pay | Admitting: Internal Medicine

## 2014-10-08 MED ORDER — PANTOPRAZOLE SODIUM 40 MG PO TBEC: 40.0000 mg | DELAYED_RELEASE_TABLET | Freq: Every day | ORAL | Status: DC

## 2014-10-08 NOTE — Telephone Encounter (Signed)
Pt wife called in said that heart dr wanted her to call dr Camila Li to see if she can call in something for acid reflux?  Something for gas?  If it is called in his health card will cover it.     Best number [phone] [id number]

## 2014-10-08 NOTE — Telephone Encounter (Signed)
Ok Protonix Done Thx

## 2014-10-09 NOTE — Telephone Encounter (Signed)
Pts wife informed.

## 2014-10-11 ENCOUNTER — Other Ambulatory Visit: Payer: Self-pay | Admitting: Internal Medicine

## 2014-10-14 ENCOUNTER — Other Ambulatory Visit: Payer: Self-pay | Admitting: Internal Medicine

## 2015-01-10 ENCOUNTER — Other Ambulatory Visit: Payer: Self-pay | Admitting: Internal Medicine

## 2015-02-01 ENCOUNTER — Other Ambulatory Visit: Payer: Self-pay | Admitting: Internal Medicine

## 2015-02-24 ENCOUNTER — Other Ambulatory Visit: Payer: Self-pay | Admitting: Internal Medicine

## 2015-02-28 ENCOUNTER — Other Ambulatory Visit: Payer: Self-pay | Admitting: Internal Medicine

## 2015-03-17 ENCOUNTER — Other Ambulatory Visit (INDEPENDENT_AMBULATORY_CARE_PROVIDER_SITE_OTHER): Payer: 59

## 2015-03-17 ENCOUNTER — Encounter: Payer: Self-pay | Admitting: Internal Medicine

## 2015-03-17 ENCOUNTER — Ambulatory Visit (INDEPENDENT_AMBULATORY_CARE_PROVIDER_SITE_OTHER): Payer: 59 | Admitting: Internal Medicine

## 2015-03-17 VITALS — BP 120/80 | HR 92 | Ht 69.0 in | Wt 249.0 lb

## 2015-03-17 DIAGNOSIS — I10 Essential (primary) hypertension: Secondary | ICD-10-CM

## 2015-03-17 DIAGNOSIS — R0683 Snoring: Secondary | ICD-10-CM | POA: Diagnosis not present

## 2015-03-17 DIAGNOSIS — R635 Abnormal weight gain: Secondary | ICD-10-CM

## 2015-03-17 DIAGNOSIS — R072 Precordial pain: Secondary | ICD-10-CM | POA: Diagnosis not present

## 2015-03-17 DIAGNOSIS — L723 Sebaceous cyst: Secondary | ICD-10-CM

## 2015-03-17 DIAGNOSIS — Z0000 Encounter for general adult medical examination without abnormal findings: Secondary | ICD-10-CM

## 2015-03-17 DIAGNOSIS — Z1211 Encounter for screening for malignant neoplasm of colon: Secondary | ICD-10-CM

## 2015-03-17 DIAGNOSIS — R0789 Other chest pain: Secondary | ICD-10-CM | POA: Insufficient documentation

## 2015-03-17 LAB — CBC WITH DIFFERENTIAL/PLATELET
Basophils Absolute: 0 10*3/uL (ref 0.0–0.1)
Basophils Relative: 0.5 % (ref 0.0–3.0)
Eosinophils Absolute: 0.5 10*3/uL (ref 0.0–0.7)
Eosinophils Relative: 6.8 % — ABNORMAL HIGH (ref 0.0–5.0)
HCT: 39.9 % (ref 39.0–52.0)
Hemoglobin: 13.1 g/dL (ref 13.0–17.0)
Lymphocytes Relative: 21.2 % (ref 12.0–46.0)
Lymphs Abs: 1.4 10*3/uL (ref 0.7–4.0)
MCHC: 32.9 g/dL (ref 30.0–36.0)
MCV: 79.8 fl (ref 78.0–100.0)
Monocytes Absolute: 0.8 10*3/uL (ref 0.1–1.0)
Monocytes Relative: 11.8 % (ref 3.0–12.0)
Neutro Abs: 4.1 10*3/uL (ref 1.4–7.7)
Neutrophils Relative %: 59.7 % (ref 43.0–77.0)
Platelets: 168 10*3/uL (ref 150.0–400.0)
RBC: 5.01 Mil/uL (ref 4.22–5.81)
RDW: 15.4 % (ref 11.5–15.5)
WBC: 6.8 10*3/uL (ref 4.0–10.5)

## 2015-03-17 LAB — HEPATIC FUNCTION PANEL
ALT: 11 U/L (ref 0–53)
AST: 14 U/L (ref 0–37)
Albumin: 3.9 g/dL (ref 3.5–5.2)
Alkaline Phosphatase: 118 U/L — ABNORMAL HIGH (ref 39–117)
Bilirubin, Direct: 0.1 mg/dL (ref 0.0–0.3)
Total Bilirubin: 0.7 mg/dL (ref 0.2–1.2)
Total Protein: 7.3 g/dL (ref 6.0–8.3)

## 2015-03-17 LAB — BASIC METABOLIC PANEL
BUN: 17 mg/dL (ref 6–23)
CO2: 28 mEq/L (ref 19–32)
Calcium: 9.1 mg/dL (ref 8.4–10.5)
Chloride: 102 mEq/L (ref 96–112)
Creatinine, Ser: 1.02 mg/dL (ref 0.40–1.50)
GFR: 94.71 mL/min (ref >60.00)
Glucose, Bld: 108 mg/dL — ABNORMAL HIGH (ref 70–99)
Potassium: 3.6 mEq/L (ref 3.5–5.1)
Sodium: 139 mEq/L (ref 135–145)

## 2015-03-17 LAB — LIPID PANEL
Cholesterol: 171 mg/dL (ref 0–200)
HDL: 27.7 mg/dL — ABNORMAL LOW (ref >39.00)
LDL Cholesterol: 113 mg/dL — ABNORMAL HIGH (ref 0–99)
NonHDL: 143.05
Total CHOL/HDL Ratio: 6
Triglycerides: 150 mg/dL — ABNORMAL HIGH (ref 0.0–149.0)
VLDL: 30 mg/dL (ref 0.0–40.0)

## 2015-03-17 LAB — TSH: TSH: 3.62 u[IU]/mL (ref 0.35–4.50)

## 2015-03-17 LAB — PSA: PSA: 3.07 ng/mL (ref 0.10–4.00)

## 2015-03-17 MED ORDER — AMLODIPINE BESYLATE 10 MG PO TABS: 10.0000 mg | ORAL_TABLET | Freq: Every day | ORAL | Status: DC

## 2015-03-17 MED ORDER — POTASSIUM CHLORIDE ER 8 MEQ PO TBCR: 8.0000 meq | EXTENDED_RELEASE_TABLET | Freq: Every day | ORAL | Status: DC

## 2015-03-17 MED ORDER — CARVEDILOL 25 MG PO TABS: 25.0000 mg | ORAL_TABLET | Freq: Two times a day (BID) | ORAL | Status: DC

## 2015-03-17 MED ORDER — CLOPIDOGREL BISULFATE 75 MG PO TABS: 75.0000 mg | ORAL_TABLET | Freq: Every day | ORAL | Status: DC

## 2015-03-17 MED ORDER — LOSARTAN POTASSIUM-HCTZ 100-25 MG PO TABS: 1.0000 | ORAL_TABLET | Freq: Every day | ORAL | Status: DC

## 2015-03-17 MED ORDER — ATORVASTATIN CALCIUM 80 MG PO TABS: 80.0000 mg | ORAL_TABLET | Freq: Every day | ORAL | Status: DC

## 2015-03-17 NOTE — Assessment & Plan Note (Signed)
Labs Loose wt

## 2015-03-17 NOTE — Assessment & Plan Note (Signed)
BP Readings from Last 3 Encounters:  03/17/15 120/80  09/24/13 140/72  03/13/13 158/92

## 2015-03-17 NOTE — Assessment & Plan Note (Signed)
Sleep consult

## 2015-03-17 NOTE — Progress Notes (Signed)
Subjective:  Patient ID: Anthony Chapman, male    DOB: 06-03-1951  Age: 64 y.o. MRN: [id number]  CC: Annual Exam   HPI   QUADARIUS HENTON presents for well exam C/o CP w/exertion - chronic- recurrent (4-7/100) - 3 min w/exertion or excitement 2-3/week ( x 12 mo) - cardiac w/up was ok.    Outpatient Prescriptions Prior to Visit  Medication Sig Dispense Refill  . amLODipine (NORVASC) 10 MG tablet TAKE 1 TABLET BY MOUTH EVERY DAY 30 tablet 0  . aspirin 81 MG EC tablet Take 81 mg by mouth daily.      Marland Kitchen atorvastatin (LIPITOR) 80 MG tablet Take 80 mg by mouth daily.    . carvedilol (COREG) 25 MG tablet TAKE 1 TABLET BY MOUTH TWICE A DAY WITH A MEAL 180 tablet 0  . clopidogrel (PLAVIX) 75 MG tablet Take 1 tablet (75 mg total) by mouth daily. 30 tablet 11  . ibuprofen (ADVIL,MOTRIN) 600 MG tablet TAKE 1 TABLET (600 MG TOTAL) BY MOUTH 2 (TWO) TIMES DAILY AS NEEDED FOR PAIN. 60 tablet 1  . isosorbide mononitrate (IMDUR) 60 MG 24 hr tablet Take 60 mg by mouth 2 (two) times daily.    Marland Kitchen KLOR-CON 8 MEQ tablet TAKE 1 TABLET BY MOUTH EVERY DAY 15 tablet 0  . losartan-hydrochlorothiazide (HYZAAR) 100-25 MG per tablet Take 1 tablet by mouth daily. For high blood pressure 30 tablet 11  . nitroGLYCERIN (NITROSTAT) 0.4 MG SL tablet Place 1 tablet (0.4 mg total) under the tongue every 5 (five) minutes as needed for chest pain. 25 tablet 1  . Cholecalciferol 1000 UNITS tablet Take 1,000 Units by mouth daily.      . pantoprazole (PROTONIX) 40 MG tablet Take 1 tablet (40 mg total) by mouth daily. (Patient not taking: Reported on 03/17/2015) 30 tablet 5  . traMADol (ULTRAM) 50 MG tablet Take 1-2 tablets (50-100 mg total) by mouth 2 (two) times daily as needed for pain. (Patient not taking: Reported on 03/17/2015) 60 tablet 1  . ezetimibe-simvastatin (VYTORIN) 10-40 MG per tablet Take 1 tablet by mouth at bedtime. (Patient not taking: Reported on 03/17/2015) 30 tablet 11   No facility-administered medications prior to  visit.    ROS Review of Systems  Constitutional: Positive for unexpected weight change. Negative for appetite change and fatigue.  HENT: Negative for congestion, nosebleeds, sneezing, sore throat and trouble swallowing.   Eyes: Negative for itching and visual disturbance.  Respiratory: Positive for chest tightness. Negative for cough and shortness of breath.   Cardiovascular: Positive for chest pain. Negative for palpitations and leg swelling.  Gastrointestinal: Negative for nausea, diarrhea, blood in stool and abdominal distention.  Genitourinary: Negative for frequency and hematuria.  Musculoskeletal: Negative for back pain, joint swelling, gait problem and neck pain.  Skin: Negative for rash.  Neurological: Negative for dizziness, tremors, speech difficulty and weakness.  Psychiatric/Behavioral: Negative for suicidal ideas, sleep disturbance, dysphoric mood and agitation. The patient is not nervous/anxious.     Objective:  BP 120/80 mmHg  Pulse 92  Ht 5\' 9"  (1.753 m)  Wt 249 lb (112.946 kg)  BMI 36.75 kg/m2  SpO2 92%  BP Readings from Last 3 Encounters:  03/17/15 120/80  09/24/13 140/72  03/13/13 158/92    Wt Readings from Last 3 Encounters:  03/17/15 249 lb (112.946 kg)  09/24/13 242 lb (109.77 kg)  03/13/13 241 lb 8 oz (109.544 kg)    Physical Exam  Constitutional: He is oriented to person, place,  and time. He appears well-developed. No distress.  Obese NAD  HENT:  Mouth/Throat: Oropharynx is clear and moist.  Eyes: Conjunctivae are normal. Pupils are equal, round, and reactive to light.  Neck: Normal range of motion. No JVD present. No thyromegaly present.  Cardiovascular: Normal rate, regular rhythm, normal heart sounds and intact distal pulses.  Exam reveals no gallop and no friction rub.   No murmur heard. Pulmonary/Chest: Effort normal and breath sounds normal. No respiratory distress. He has no wheezes. He has no rales. He exhibits no tenderness.    Abdominal: Soft. Bowel sounds are normal. He exhibits no distension and no mass. There is no tenderness. There is no rebound and no guarding.  Musculoskeletal: Normal range of motion. He exhibits no edema or tenderness.  Lymphadenopathy:    He has no cervical adenopathy.  Neurological: He is alert and oriented to person, place, and time. He has normal reflexes. No cranial nerve deficit. He exhibits normal muscle tone. He displays a negative Romberg sign. Coordination and gait normal.  Skin: Skin is warm and dry. No rash noted.  Psychiatric: He has a normal mood and affect. His behavior is normal. Judgment and thought content normal.  Prostate 1+ G(-) stool Large Cyst on scalp 4 cm  Lab Results  Component Value Date   WBC 5.9 07/25/2012   HGB 13.6 07/25/2012   HCT 41.5 07/25/2012   PLT 175.0 07/25/2012   GLUCOSE 127* 07/25/2012   CHOL 258* 07/25/2012   TRIG 162.0* 07/25/2012   HDL 33.30* 07/25/2012   LDLDIRECT 199.5 07/25/2012   LDLCALC 135* 02/17/2009   ALT 14 07/25/2012   AST 19 07/25/2012   NA 139 07/25/2012   K 4.0 07/25/2012   CL 103 07/25/2012   CREATININE 1.0 07/25/2012   BUN 18 07/25/2012   CO2 30 07/25/2012   TSH 3.03 07/25/2012   PSA 2.00 07/25/2012   HGBA1C 6.4 07/25/2012    Dg Cervical Spine Complete  03/06/2013   *RADIOLOGY REPORT*  Clinical Data: History of injury and neck pain.  Injury 6 days previously.  Right-sided pain.  CERVICAL SPINE - COMPLETE 4+ VIEW  Comparison: 01/20/2011 study.  Findings: There is no evidence of prevertebral soft tissue swelling.  On the lateral image there is straightening and slight reversal of the normal cervical lordosis.  This may be associated with muscle spasm in some patients.  There is narrowing of the intervertebral disc space at the level of C5-C6.  Multilevel marginal osteophyte formation is seen representing degenerative spondylosis.  There is also some posterior bony spurring most prominently at C5-C6 level.  No fracture or  bony destruction is seen.  No significant subluxation is seen.  There is foraminal encroachment by uncovertebral spurring at the levels of C5-C6 and C6-C7 bilaterally.  No cervical ribs are seen.  IMPRESSION: On the lateral image there is straightening and slight reversal of the normal cervical lordosis.  This may be associated with muscle spasm in some patients.  There is narrowing of the intervertebral disc space at the level of C5-C6.  Multilevel marginal osteophyte formation is seen representing degenerative spondylosis. There is foraminal encroachment by uncovertebral spurring at the levels of C5-C6 and C6-C7 bilaterally.   Original Report Authenticated By: Shanon Brow Call    Assessment & Plan:   There are no diagnoses linked to this encounter. I have discontinued Mr. Mciver ezetimibe-simvastatin. I am also having him maintain his aspirin, Cholecalciferol, losartan-hydrochlorothiazide, nitroGLYCERIN, clopidogrel, ibuprofen, traMADol, atorvastatin, isosorbide mononitrate, pantoprazole, carvedilol, KLOR-CON, and amLODipine.  No orders of the defined types were placed in this encounter.     Follow-up: No Follow-up on file.  Walker Kehr, MD

## 2015-03-17 NOTE — Assessment & Plan Note (Signed)
4x3 cm Surg ref

## 2015-03-17 NOTE — Progress Notes (Signed)
Pre visit review using our clinic review tool, if applicable. No additional management support is needed unless otherwise documented below in the visit note. 

## 2015-03-17 NOTE — Assessment & Plan Note (Addendum)
Chronic. STENTx2 in 2/15 Dr Marijo File Loose wt

## 2015-03-17 NOTE — Assessment & Plan Note (Signed)
We discussed age appropriate health related issues, including available/recomended screening tests and vaccinations. We discussed a need for adhering to healthy diet and exercise. Labs/EKG were reviewed/ordered. All questions were answered.   

## 2015-04-06 ENCOUNTER — Other Ambulatory Visit: Payer: Self-pay | Admitting: Internal Medicine

## 2015-04-18 ENCOUNTER — Encounter: Payer: Self-pay | Admitting: Internal Medicine

## 2015-05-05 ENCOUNTER — Institutional Professional Consult (permissible substitution): Payer: 59 | Admitting: Pulmonary Disease

## 2015-05-07 ENCOUNTER — Telehealth: Payer: Self-pay | Admitting: Pulmonary Disease

## 2015-05-07 NOTE — Telephone Encounter (Signed)
Called and lmtcb x1

## 2015-05-08 ENCOUNTER — Institutional Professional Consult (permissible substitution): Payer: 59 | Admitting: Pulmonary Disease

## 2015-05-09 NOTE — Telephone Encounter (Signed)
lmtcb x2 

## 2015-05-12 NOTE — Telephone Encounter (Signed)
Called and spoke to pt. Pt is requesting a sooner appt with [facility] for sleep. Advised pt [facility] is all booked up and to call back once or twice a week to see if [facility] has any cancellations. Pt verbalized understanding and denied any further questions or concerns at this time.

## 2015-09-12 ENCOUNTER — Encounter: Payer: Self-pay | Admitting: Internal Medicine

## 2015-09-12 ENCOUNTER — Ambulatory Visit (INDEPENDENT_AMBULATORY_CARE_PROVIDER_SITE_OTHER)
Admission: RE | Admit: 2015-09-12 | Discharge: 2015-09-12 | Disposition: A | Discharge status: Home / Self Care | Payer: 59 | Source: Ambulatory Visit | Attending: Internal Medicine | Admitting: Internal Medicine

## 2015-09-12 ENCOUNTER — Ambulatory Visit (INDEPENDENT_AMBULATORY_CARE_PROVIDER_SITE_OTHER): Payer: 59 | Admitting: Internal Medicine

## 2015-09-12 VITALS — BP 150/88 | HR 79 | Wt 252.0 lb

## 2015-09-12 DIAGNOSIS — M542 Cervicalgia: Secondary | ICD-10-CM

## 2015-09-12 DIAGNOSIS — R209 Unspecified disturbances of skin sensation: Secondary | ICD-10-CM

## 2015-09-12 MED ORDER — CYCLOBENZAPRINE HCL ER 15 MG PO CP24: 15.0000 mg | ORAL_CAPSULE | Freq: Every day | ORAL | Status: DC | PRN
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
## 2015-09-12 NOTE — Patient Instructions (Signed)
Cervical Sprain  A cervical sprain is an injury in the neck in which the strong, fibrous tissues (ligaments) that connect your neck bones stretch or tear. Cervical sprains can range from mild to severe. Severe cervical sprains can cause the neck vertebrae to be unstable. This can lead to damage of the spinal cord and can result in serious nervous system problems. The amount of time it takes for a cervical sprain to get better depends on the cause and extent of the injury. Most cervical sprains heal in 1 to 3 weeks.  CAUSES   Severe cervical sprains may be caused by:    Contact sport injuries (such as from football, rugby, wrestling, hockey, auto racing, gymnastics, diving, martial arts, or boxing).    Motor vehicle collisions.    Whiplash injuries. This is an injury from a sudden forward and backward whipping movement of the head and neck.   Falls.   Mild cervical sprains may be caused by:    Being in an awkward position, such as while cradling a telephone between your ear and shoulder.    Sitting in a chair that does not offer proper support.    Working at a poorly designed computer station.    Looking up or down for long periods of time.   SYMPTOMS    Pain, soreness, stiffness, or a burning sensation in the front, back, or sides of the neck. This discomfort may develop immediately after the injury or slowly, 24 hours or more after the injury.    Pain or tenderness directly in the middle of the back of the neck.    Shoulder or upper back pain.    Limited ability to move the neck.    Headache.    Dizziness.    Weakness, numbness, or tingling in the hands or arms.    Muscle spasms.    Difficulty swallowing or chewing.    Tenderness and swelling of the neck.   DIAGNOSIS   Most of the time your health care provider can diagnose a cervical sprain by taking your history and doing a physical exam. Your health care provider will ask about previous neck injuries and any known neck  problems, such as arthritis in the neck. X-rays may be taken to find out if there are any other problems, such as with the bones of the neck. Other tests, such as a CT scan or MRI, may also be needed.   TREATMENT   Treatment depends on the severity of the cervical sprain. Mild sprains can be treated with rest, keeping the neck in place (immobilization), and pain medicines. Severe cervical sprains are immediately immobilized. Further treatment is done to help with pain, muscle spasms, and other symptoms and may include:   Medicines, such as pain relievers, numbing medicines, or muscle relaxants.    Physical therapy. This may involve stretching exercises, strengthening exercises, and posture training. Exercises and improved posture can help stabilize the neck, strengthen muscles, and help stop symptoms from returning.   HOME CARE INSTRUCTIONS    Put ice on the injured area.     Put ice in a plastic bag.     Place a towel between your skin and the bag.     Leave the ice on for 15-20 minutes, 3-4 times a day.    If your injury was severe, you may have been given a cervical collar to wear. A cervical collar is a two-piece collar designed to keep your neck from moving while it heals.      Do not remove the collar unless instructed by your health care provider.    If you have long hair, keep it outside of the collar.    Ask your health care provider before making any adjustments to your collar. Minor adjustments may be required over time to improve comfort and reduce pressure on your chin or on the back of your head.    Ifyou are allowed to remove the collar for cleaning or bathing, follow your health care provider's instructions on how to do so safely.    Keep your collar clean by wiping it with mild soap and water and drying it completely. If the collar you have been given includes removable pads, remove them every 1-2 days and hand wash them with soap and water. Allow them to air dry. They should be completely  dry before you wear them in the collar.    If you are allowed to remove the collar for cleaning and bathing, wash and dry the skin of your neck. Check your skin for irritation or sores. If you see any, tell your health care provider.    Do not drive while wearing the collar.    Only take over-the-counter or prescription medicines for pain, discomfort, or fever as directed by your health care provider.    Keep all follow-up appointments as directed by your health care provider.    Keep all physical therapy appointments as directed by your health care provider.    Make any needed adjustments to your workstation to promote good posture.    Avoid positions and activities that make your symptoms worse.    Warm up and stretch before being active to help prevent problems.   SEEK MEDICAL CARE IF:    Your pain is not controlled with medicine.    You are unable to decrease your pain medicine over time as planned.    Your activity level is not improving as expected.   SEEK IMMEDIATE MEDICAL CARE IF:    You develop any bleeding.   You develop stomach upset.   You have signs of an allergic reaction to your medicine.    Your symptoms get worse.    You develop new, unexplained symptoms.    You have numbness, tingling, weakness, or paralysis in any part of your body.   MAKE SURE YOU:    Understand these instructions.   Will watch your condition.   Will get help right away if you are not doing well or get worse.     This information is not intended to replace advice given to you by your health care provider. Make sure you discuss any questions you have with your health care provider.     Document Released: 05/23/2007 Document Revised: 07/31/2013 Document Reviewed: 01/31/2013  [employer]vier Interactive Patient Education 2016 [employer]

## 2015-09-12 NOTE — Progress Notes (Signed)
Pre visit review using our clinic review tool, if applicable. No additional management support is needed unless otherwise documented below in the visit note. 
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
## 2015-09-15 ENCOUNTER — Other Ambulatory Visit: Payer: Self-pay | Admitting: Internal Medicine

## 2015-09-15 ENCOUNTER — Telehealth: Payer: Self-pay | Admitting: Internal Medicine

## 2015-09-15 MED ORDER — CYCLOBENZAPRINE HCL 5 MG PO TABS: 5.0000 mg | ORAL_TABLET | Freq: Every day | ORAL | Status: DC

## 2015-09-15 NOTE — Telephone Encounter (Signed)
I'll email Flexeril  Thx

## 2015-09-15 NOTE — Telephone Encounter (Signed)
Patient is requesting that   cyclobenzaprine (AMRIX) 15 MG 24 hr capsule [id number]      Be substituted to the same pharmacy,. Patient states that this RX is not covered by his insurance and that he would have to pay out of pocket

## 2015-09-16 NOTE — Telephone Encounter (Signed)
Left detailed mess informed of below.

## 2015-09-17 ENCOUNTER — Encounter: Payer: Self-pay | Admitting: Internal Medicine

## 2015-09-17 ENCOUNTER — Other Ambulatory Visit (INDEPENDENT_AMBULATORY_CARE_PROVIDER_SITE_OTHER): Payer: 59

## 2015-09-17 ENCOUNTER — Ambulatory Visit (INDEPENDENT_AMBULATORY_CARE_PROVIDER_SITE_OTHER): Payer: 59 | Admitting: Internal Medicine

## 2015-09-17 VITALS — BP 136/92 | HR 78 | Temp 98.4°F | Resp 16 | Ht 69.0 in | Wt 251.0 lb

## 2015-09-17 DIAGNOSIS — M542 Cervicalgia: Secondary | ICD-10-CM

## 2015-09-17 DIAGNOSIS — R972 Elevated prostate specific antigen [PSA]: Secondary | ICD-10-CM | POA: Insufficient documentation

## 2015-09-17 DIAGNOSIS — I2510 Atherosclerotic heart disease of native coronary artery without angina pectoris: Secondary | ICD-10-CM

## 2015-09-17 DIAGNOSIS — I10 Essential (primary) hypertension: Secondary | ICD-10-CM | POA: Diagnosis not present

## 2015-09-17 LAB — BASIC METABOLIC PANEL
BUN: 15 mg/dL (ref 6–23)
CO2: 31 mEq/L (ref 19–32)
Calcium: 9.2 mg/dL (ref 8.4–10.5)
Chloride: 103 mEq/L (ref 96–112)
Creatinine, Ser: 1.1 mg/dL (ref 0.40–1.50)
GFR: 86.67 mL/min (ref >60.00)
Glucose, Bld: 101 mg/dL — ABNORMAL HIGH (ref 70–99)
Potassium: 3.9 mEq/L (ref 3.5–5.1)
Sodium: 141 mEq/L (ref 135–145)

## 2015-09-17 MED ORDER — PANTOPRAZOLE SODIUM 40 MG PO TBEC: 40.0000 mg | DELAYED_RELEASE_TABLET | Freq: Every day | ORAL | Status: DC

## 2015-09-17 MED ORDER — AMLODIPINE BESYLATE 10 MG PO TABS: 10.0000 mg | ORAL_TABLET | Freq: Every day | ORAL | Status: DC

## 2015-09-17 MED ORDER — LOSARTAN POTASSIUM-HCTZ 100-25 MG PO TABS: 1.0000 | ORAL_TABLET | Freq: Every day | ORAL | Status: DC

## 2015-09-17 MED ORDER — CARVEDILOL 25 MG PO TABS: 25.0000 mg | ORAL_TABLET | Freq: Two times a day (BID) | ORAL | Status: DC

## 2015-09-17 MED ORDER — ISOSORBIDE MONONITRATE ER 60 MG PO TB24: 60.0000 mg | ORAL_TABLET | Freq: Two times a day (BID) | ORAL | Status: DC

## 2015-09-17 MED ORDER — POTASSIUM CHLORIDE ER 8 MEQ PO TBCR: 8.0000 meq | EXTENDED_RELEASE_TABLET | Freq: Every day | ORAL | Status: DC

## 2015-09-17 MED ORDER — ATORVASTATIN CALCIUM 80 MG PO TABS: 80.0000 mg | ORAL_TABLET | Freq: Every day | ORAL | Status: DC

## 2015-09-17 NOTE — Assessment & Plan Note (Signed)
Flexeril

## 2015-09-17 NOTE — Assessment & Plan Note (Signed)
Dr Marijo File Isosorbide, NTG prn, ASA, Plavix, norvasc

## 2015-09-17 NOTE — Assessment & Plan Note (Signed)
Isosorbide, hyzaar, norvasc 

## 2015-09-17 NOTE — Assessment & Plan Note (Signed)
Free PSA 

## 2015-09-17 NOTE — Progress Notes (Signed)
Pre visit review using our clinic review tool, if applicable. No additional management support is needed unless otherwise documented below in the visit note. 
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
## 2015-09-17 NOTE — Assessment & Plan Note (Signed)
PT if needed 

## 2015-09-18 LAB — PSA, TOTAL AND FREE
PSA, Free Pct: 19 % — ABNORMAL LOW (ref >25)
PSA, Free: 0.8 ng/mL
PSA: 4.11 ng/mL — ABNORMAL HIGH (ref <=4.00)

## 2016-01-08 DIAGNOSIS — I447 Left bundle-branch block, unspecified: Secondary | ICD-10-CM | POA: Insufficient documentation

## 2016-01-08 DIAGNOSIS — Z720 Tobacco use: Secondary | ICD-10-CM | POA: Insufficient documentation

## 2016-01-08 DIAGNOSIS — Z7982 Long term (current) use of aspirin: Secondary | ICD-10-CM | POA: Insufficient documentation

## 2016-01-18 ENCOUNTER — Encounter (HOSPITAL_BASED_OUTPATIENT_CLINIC_OR_DEPARTMENT_OTHER): Payer: Self-pay | Admitting: Emergency Medicine

## 2016-01-18 ENCOUNTER — Emergency Department (HOSPITAL_BASED_OUTPATIENT_CLINIC_OR_DEPARTMENT_OTHER)
Admission: EM | Admit: 2016-01-18 | Discharge: 2016-01-18 | Disposition: A | Discharge status: Home / Self Care | Payer: 59 | Attending: Emergency Medicine | Admitting: Emergency Medicine

## 2016-01-18 DIAGNOSIS — M1990 Unspecified osteoarthritis, unspecified site: Secondary | ICD-10-CM | POA: Diagnosis not present

## 2016-01-18 DIAGNOSIS — T783XXA Angioneurotic edema, initial encounter: Secondary | ICD-10-CM | POA: Insufficient documentation

## 2016-01-18 DIAGNOSIS — Z7982 Long term (current) use of aspirin: Secondary | ICD-10-CM | POA: Insufficient documentation

## 2016-01-18 DIAGNOSIS — I2510 Atherosclerotic heart disease of native coronary artery without angina pectoris: Secondary | ICD-10-CM | POA: Insufficient documentation

## 2016-01-18 DIAGNOSIS — E785 Hyperlipidemia, unspecified: Secondary | ICD-10-CM | POA: Insufficient documentation

## 2016-01-18 DIAGNOSIS — I10 Essential (primary) hypertension: Secondary | ICD-10-CM | POA: Diagnosis not present

## 2016-01-18 DIAGNOSIS — Z87891 Personal history of nicotine dependence: Secondary | ICD-10-CM | POA: Insufficient documentation

## 2016-01-18 DIAGNOSIS — Z79899 Other long term (current) drug therapy: Secondary | ICD-10-CM | POA: Insufficient documentation

## 2016-01-18 DIAGNOSIS — T7840XA Allergy, unspecified, initial encounter: Secondary | ICD-10-CM | POA: Diagnosis present

## 2016-01-18 MED ORDER — DIPHENHYDRAMINE HCL 25 MG PO CAPS
50.0000 mg | ORAL_CAPSULE | Freq: Once | ORAL | Status: DC
Start: 2016-01-18 — End: 2016-01-18
  Filled 2016-01-18: qty 2

## 2016-01-18 MED ORDER — FAMOTIDINE 20 MG PO TABS: 20.0000 mg | ORAL_TABLET | Freq: Every day | ORAL | Status: DC

## 2016-01-18 MED ORDER — PREDNISONE 50 MG PO TABS
50.0000 mg | ORAL_TABLET | Freq: Once | ORAL | Status: AC
  Administered 2016-01-18: 50 mg via ORAL
  Filled 2016-01-18: qty 1

## 2016-01-18 MED ORDER — DIPHENHYDRAMINE HCL 25 MG PO TABS: 50.0000 mg | ORAL_TABLET | Freq: Four times a day (QID) | ORAL | Status: DC | PRN

## 2016-01-18 MED ORDER — FAMOTIDINE 20 MG PO TABS
20.0000 mg | ORAL_TABLET | Freq: Once | ORAL | Status: AC
Start: 2016-01-18 — End: 2016-01-18
  Administered 2016-01-18: 20 mg via ORAL
  Filled 2016-01-18: qty 1

## 2016-01-18 MED ORDER — DIPHENHYDRAMINE HCL 25 MG PO CAPS
50.0000 mg | ORAL_CAPSULE | Freq: Once | ORAL | Status: AC
  Administered 2016-01-18: 50 mg via ORAL
  Filled 2016-01-18: qty 2

## 2016-01-18 MED ORDER — PREDNISONE 10 MG PO TABS: 50.0000 mg | ORAL_TABLET | Freq: Every day | ORAL | Status: DC

## 2016-01-18 MED ORDER — HYDROCHLOROTHIAZIDE 25 MG PO TABS: 25.0000 mg | ORAL_TABLET | Freq: Every day | ORAL | Status: DC

## 2016-01-18 NOTE — ED Provider Notes (Signed)
CSN: [id number]     Arrival date & time 01/18/16  1220 History   First MD Initiated Contact with Patient 01/18/16 1305     Chief Complaint  Patient presents with  . Allergic Reaction     (Consider location/radiation/quality/duration/timing/severity/associated sxs/prior Treatment) HPI 65 year old male who presents with swelling of the upper lip. States that he noticed some tingling and swelling yesterday before going to bed, but this morning swelling was more noticeable in his right upper lip. This is happened to him twice before since [facility] Day weekend. Both times involve the upper left, and resolved without intervention. I did not seek medical attention for the 2 prior episodes. Takes an angiotensin II receptor blocker but no ACE inhibitor. Denies any associating tongue swelling or swelling of the posterior oropharynx. Denies shortness of breath, difficulty swallowing secretions, shortness of breath, chest pain, nausea or vomiting, abdominal pain, rash or hives. Did report that the prior 2 times he did develop urticaria in his back and left axilla. Denies any new medication changes since before [facility] Day and sedated. Denies new food exposures. Denies new detergents, soaps, lotions, or any other inciting factors. Past Medical History  Diagnosis Date  . CAD (coronary artery disease)     Dr McGudin in [place]  . Hyperlipemia   . HTN (hypertension)   . ED (erectile dysfunction)   . Arthritis    Past Surgical History  Procedure Laterality Date  . Stents  2010   Family History  Problem Relation Age of Onset  . Hypertension    . Hypertension Mother   . Hypertension Father   . Arthritis Father    Social History  Substance Use Topics  . Smoking status: Former Research scientist (life sciences)  . Smokeless tobacco: None  . Alcohol Use: No    Review of Systems 10/14 systems reviewed and are negative other than those stated in the HPI   Allergies  Review of patient's allergies indicates no known  allergies.  Home Medications   Prior to Admission medications   Medication Sig Start Date End Date Taking? Authorizing Provider  amLODipine (NORVASC) 10 MG tablet Take 1 tablet (10 mg total) by mouth daily. 09/17/15  Yes Evie Lacks Plotnikov, MD  aspirin 81 MG EC tablet Take 81 mg by mouth daily.     Yes Historical Provider, MD  atorvastatin (LIPITOR) 80 MG tablet Take 1 tablet (80 mg total) by mouth daily. 09/17/15  Yes Cassandria Anger, MD  carvedilol (COREG) 25 MG tablet Take 1 tablet (25 mg total) by mouth 2 (two) times daily with a meal. 09/17/15  Yes Evie Lacks Plotnikov, MD  losartan-hydrochlorothiazide (HYZAAR) 100-25 MG tablet Take 1 tablet by mouth daily. For high blood pressure 09/17/15  Yes Cassandria Anger, MD  Cholecalciferol 1000 UNITS tablet Take 1,000 Units by mouth daily.      Historical Provider, MD  cyclobenzaprine (FLEXERIL) 5 MG tablet Take 1 tablet (5 mg total) by mouth at bedtime. 09/15/15   Cassandria Anger, MD  diphenhydrAMINE (BENADRYL) 25 MG tablet Take 2 tablets (50 mg total) by mouth every 6 (six) hours as needed (swelling). 01/18/16   Forde Dandy, MD  famotidine (PEPCID) 20 MG tablet Take 1 tablet (20 mg total) by mouth daily. 01/18/16   Forde Dandy, MD  hydrochlorothiazide (HYDRODIURIL) 25 MG tablet Take 1 tablet (25 mg total) by mouth daily. 01/18/16   Forde Dandy, MD  ibuprofen (ADVIL,MOTRIN) 600 MG tablet TAKE 1 TABLET (600 MG TOTAL) BY MOUTH 2 (  TWO) TIMES DAILY AS NEEDED FOR PAIN. 10/26/12   Cassandria Anger, MD  nitroGLYCERIN (NITROSTAT) 0.4 MG SL tablet Place 1 tablet (0.4 mg total) under the tongue every 5 (five) minutes as needed for chest pain. 07/25/12   Evie Lacks Plotnikov, MD  pantoprazole (PROTONIX) 40 MG tablet Take 1 tablet (40 mg total) by mouth daily. 09/17/15   Evie Lacks Plotnikov, MD  potassium chloride (KLOR-CON) 8 MEQ tablet Take 1 tablet (8 mEq total) by mouth daily. 09/17/15   Evie Lacks Plotnikov, MD  predniSONE (DELTASONE) 10 MG tablet Take 5  tablets (50 mg total) by mouth daily. 01/18/16   Forde Dandy, MD  traMADol (ULTRAM) 50 MG tablet Take 1-2 tablets (50-100 mg total) by mouth 2 (two) times daily as needed for pain. 03/06/13   Evie Lacks Plotnikov, MD   BP 121/64 mmHg  Pulse 68  Temp(Src) 98.2 F (36.8 C) (Oral)  Resp 20  Ht 5\' 9"  (1.753 m)  Wt 248 lb (112.492 kg)  BMI 36.61 kg/m2  SpO2 92% Physical Exam Physical Exam  Nursing note and vitals reviewed. Constitutional: Well developed, well nourished, non-toxic, and in no acute distress Head: Normocephalic and atraumatic.  Mouth/Throat: Oropharynx is clear and moist.  angioedema involving the right upper lip. Neck: Normal range of motion. Neck supple.  Cardiovascular: Normal rate and regular rhythm.   Pulmonary/Chest: Effort normal and breath sounds normal.  Abdominal: Soft. There is no tenderness. There is no rebound and no guarding.  Musculoskeletal: Normal range of motion.  Neurological: Alert, no facial droop, fluent speech, moves all extremities symmetrically Skin: Skin is warm and dry.  Psychiatric: Cooperative  ED Course  Procedures (including critical care time) Labs Review Labs Reviewed - No data to display  Imaging Review No results found. I have personally reviewed and evaluated these images and lab results as part of my medical decision-making.   EKG Interpretation None      MDM   Final diagnoses:  Angioedema, initial encounter    65 year old male who presents with angioedema of the right upper lip. He is well-appearing in no acute distress. Vital signs stable. He is breathing comfortably on room air, handling secretions, without any concern for airway involvement. Improving without medications, but given prednisone, Benadryl, and Pepcid. Taking an ARB, and question if recurrent angioedema may be secondary to this. He will discontinue his losartan but will continue the remainder of his medications and discuss further blood pressure medication  management with his cardiologist. He will follow up closely with his primary care doctor as well regarding further testing is needed. Observed in ED, with resolving angioedema. Stable for discharge home. Discharged with course of steroids, Pepcid and Benadryl.    Forde Dandy, MD 01/18/16 [phone]

## 2016-01-18 NOTE — ED Notes (Signed)
Pt called with questions about whether to continue all home meds. Clarified with Dr. Ralene Bathe that pt is to continue all home meds with the exception of Hyzaar. Instead of Hyzaar, pt is to take the HCTZ prescribed earlier by Dr. Oleta Mouse. Pt reports he's gotten that prescription filled. Pt verbalized understanding of how to take medications.

## 2016-01-18 NOTE — ED Notes (Signed)
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

## 2016-01-18 NOTE — ED Notes (Signed)
Pt states he is feeling much better since being given meds.

## 2016-01-18 NOTE — Discharge Instructions (Signed)
Stop taking your Hyzaar, and only take the hydrochlorothiazide portion of it per your new prescription. Finish her steroid burst. Continue taking Pepcid and Benadryl until swelling resolved. Return for worsening symptoms including worsening swelling, tongue or throat swelling, difficulty breathing, or any other symptoms concerning to you. You'll need to discuss with her primary care doctor about further allergy testing or genetic testing for angioedema. You'll also need to discuss blood pressure management with your cardiologist.  Angioedema Angioedema is a sudden swelling of tissues, often of the skin. It can occur on the face or genitals or in the abdomen or other body parts. The swelling usually develops over a short period and gets better in 24 to 48 hours. It often begins during the night and is found when the person wakes up. The person may also get red, itchy patches of skin (hives). Angioedema can be dangerous if it involves swelling of the air passages.  Depending on the cause, episodes of angioedema may only happen once, come back in unpredictable patterns, or repeat for several years and then gradually fade away.  CAUSES  Angioedema can be caused by an allergic reaction to various triggers. It can also result from nonallergic causes, including reactions to drugs, immune system disorders, viral infections, or an abnormal gene that is passed to you from your parents (hereditary). For some people with angioedema, the cause is unknown.  Some things that can trigger angioedema include:   Foods.   Medicines, such as ACE inhibitors, ARBs, nonsteroidal anti-inflammatory agents, or estrogen.   Latex.   Animal saliva.   Insect stings.   Dyes used in X-rays.   Mild injury.   Dental work.  Surgery.  Stress.   Sudden changes in temperature.   Exercise. SIGNS AND SYMPTOMS   Swelling of the skin.  Hives. If these are present, there is also intense itching.  Redness in the  affected area.   Pain in the affected area.  Swollen lips or tongue.  Breathing problems. This may happen if the air passages swell.  Wheezing. If internal organs are involved, there may be:   Nausea.   Abdominal pain.   Vomiting.   Difficulty swallowing.   Difficulty passing urine. DIAGNOSIS   Your health care provider will examine the affected area and take a medical and family history.  Various tests may be done to help determine the cause. Tests may include:  Allergy skin tests to see if the problem is an allergic reaction.   Blood tests to check for hereditary angioedema.   Tests to check for underlying diseases that could cause the condition.   A review of your medicines, including over-the-counter medicines, may be done. TREATMENT  Treatment will depend on the cause of the angioedema. Possible treatments include:   Removal of anything that triggered the condition (such as stopping certain medicines).   Medicines to treat symptoms or prevent attacks. Medicines given may include:   Antihistamines.   Epinephrine injection.   Steroids.   Hospitalization may be required for severe attacks. If the air passages are affected, it can be an emergency. Tubes may need to be placed to keep the airway open. HOME CARE INSTRUCTIONS   Take all medicines as directed by your health care provider.  If you were given medicines for emergency allergy treatment, always carry them with you.  Wear a medical bracelet as directed by your health care provider.   Avoid known triggers. SEEK MEDICAL CARE IF:   You have repeat attacks of  angioedema.   Your attacks are more frequent or more severe despite preventive measures.   You have hereditary angioedema and are considering having children. It is important to discuss with your health care provider the risks of passing the condition on to your children. SEEK IMMEDIATE MEDICAL CARE IF:   You have severe  swelling of the mouth, tongue, or lips.  You have difficulty breathing.   You have difficulty swallowing.   You faint. MAKE SURE YOU:  Understand these instructions.  Will watch your condition.  Will get help right away if you are not doing well or get worse.   This information is not intended to replace advice given to you by your health care provider. Make sure you discuss any questions you have with your health care provider.   Document Released: 10/04/2001 Document Revised: 08/16/2014 Document Reviewed: 03/19/2013 Elsevier Interactive Patient Education [employer].

## 2016-04-05 ENCOUNTER — Other Ambulatory Visit: Payer: Self-pay | Admitting: *Deleted

## 2016-04-05 MED ORDER — ATORVASTATIN CALCIUM 80 MG PO TABS: 80.0000 mg | ORAL_TABLET | Freq: Every day | ORAL | 0 refills | Status: DC

## 2016-07-09 ENCOUNTER — Ambulatory Visit (INDEPENDENT_AMBULATORY_CARE_PROVIDER_SITE_OTHER): Payer: 59 | Admitting: Internal Medicine

## 2016-07-09 ENCOUNTER — Other Ambulatory Visit (INDEPENDENT_AMBULATORY_CARE_PROVIDER_SITE_OTHER): Payer: 59

## 2016-07-09 ENCOUNTER — Encounter: Payer: Self-pay | Admitting: Internal Medicine

## 2016-07-09 ENCOUNTER — Ambulatory Visit: Payer: 59 | Admitting: Internal Medicine

## 2016-07-09 VITALS — BP 120/84 | HR 82 | Wt 246.0 lb

## 2016-07-09 DIAGNOSIS — R972 Elevated prostate specific antigen [PSA]: Secondary | ICD-10-CM

## 2016-07-09 DIAGNOSIS — I10 Essential (primary) hypertension: Secondary | ICD-10-CM | POA: Diagnosis not present

## 2016-07-09 DIAGNOSIS — R0683 Snoring: Secondary | ICD-10-CM | POA: Diagnosis not present

## 2016-07-09 DIAGNOSIS — I2510 Atherosclerotic heart disease of native coronary artery without angina pectoris: Secondary | ICD-10-CM | POA: Diagnosis not present

## 2016-07-09 DIAGNOSIS — R7309 Other abnormal glucose: Secondary | ICD-10-CM | POA: Diagnosis not present

## 2016-07-09 LAB — URINALYSIS
Bilirubin Urine: NEGATIVE
Hgb urine dipstick: NEGATIVE
Ketones, ur: NEGATIVE
Leukocytes, UA: NEGATIVE
Nitrite: NEGATIVE
Specific Gravity, Urine: 1.02 (ref 1.000–1.030)
Total Protein, Urine: NEGATIVE
Urine Glucose: NEGATIVE
Urobilinogen, UA: 2 — AB (ref 0.0–1.0)
pH: 6 (ref 5.0–8.0)

## 2016-07-09 LAB — TSH: TSH: 3.94 u[IU]/mL (ref 0.35–4.50)

## 2016-07-09 LAB — LIPID PANEL
Cholesterol: 161 mg/dL (ref 0–200)
HDL: 28.9 mg/dL — ABNORMAL LOW (ref >39.00)
LDL Cholesterol: 113 mg/dL — ABNORMAL HIGH (ref 0–99)
NonHDL: 132.51
Total CHOL/HDL Ratio: 6
Triglycerides: 96 mg/dL (ref 0.0–149.0)
VLDL: 19.2 mg/dL (ref 0.0–40.0)

## 2016-07-09 LAB — HEPATIC FUNCTION PANEL
ALT: 13 U/L (ref 0–53)
AST: 16 U/L (ref 0–37)
Albumin: 3.8 g/dL (ref 3.5–5.2)
Alkaline Phosphatase: 141 U/L — ABNORMAL HIGH (ref 39–117)
Bilirubin, Direct: 0.1 mg/dL (ref 0.0–0.3)
Total Bilirubin: 0.6 mg/dL (ref 0.2–1.2)
Total Protein: 7.5 g/dL (ref 6.0–8.3)

## 2016-07-09 LAB — CBC WITH DIFFERENTIAL/PLATELET
Basophils Absolute: 0 10*3/uL (ref 0.0–0.1)
Basophils Relative: 0.6 % (ref 0.0–3.0)
Eosinophils Absolute: 0.3 10*3/uL (ref 0.0–0.7)
Eosinophils Relative: 5.6 % — ABNORMAL HIGH (ref 0.0–5.0)
HCT: 42.4 % (ref 39.0–52.0)
Hemoglobin: 13.8 g/dL (ref 13.0–17.0)
Lymphocytes Relative: 21.3 % (ref 12.0–46.0)
Lymphs Abs: 1.2 10*3/uL (ref 0.7–4.0)
MCHC: 32.4 g/dL (ref 30.0–36.0)
MCV: 78.9 fl (ref 78.0–100.0)
Monocytes Absolute: 0.7 10*3/uL (ref 0.1–1.0)
Monocytes Relative: 11.9 % (ref 3.0–12.0)
Neutro Abs: 3.5 10*3/uL (ref 1.4–7.7)
Neutrophils Relative %: 60.6 % (ref 43.0–77.0)
Platelets: 170 10*3/uL (ref 150.0–400.0)
RBC: 5.38 Mil/uL (ref 4.22–5.81)
RDW: 15.8 % — ABNORMAL HIGH (ref 11.5–15.5)
WBC: 5.8 10*3/uL (ref 4.0–10.5)

## 2016-07-09 LAB — BASIC METABOLIC PANEL
BUN: 14 mg/dL (ref 6–23)
CO2: 29 mEq/L (ref 19–32)
Calcium: 8.9 mg/dL (ref 8.4–10.5)
Chloride: 104 mEq/L (ref 96–112)
Creatinine, Ser: 0.91 mg/dL (ref 0.40–1.50)
GFR: 107.59 mL/min (ref >60.00)
Glucose, Bld: 115 mg/dL — ABNORMAL HIGH (ref 70–99)
Potassium: 4 mEq/L (ref 3.5–5.1)
Sodium: 141 mEq/L (ref 135–145)

## 2016-07-09 LAB — HEMOGLOBIN A1C: Hgb A1c MFr Bld: 6.2 % (ref 4.6–6.5)

## 2016-07-09 LAB — PSA: PSA: 4.77 ng/mL — ABNORMAL HIGH (ref 0.10–4.00)

## 2016-07-09 NOTE — Assessment & Plan Note (Signed)
Sleep consult Dr Halford Chessman

## 2016-07-09 NOTE — Progress Notes (Signed)
Subjective:  Patient ID: Anthony Chapman, male    DOB: 05/03/1951  Age: 65 y.o. MRN: [id number]  CC: No chief complaint on file.   HPI KEYMARI ZUEGE presents for CAD, DM, gout f/u. C/o snoring. Pt needs a new cardiologist  Outpatient Medications Prior to Visit  Medication Sig Dispense Refill  . amLODipine (NORVASC) 10 MG tablet Take 1 tablet (10 mg total) by mouth daily. 90 tablet 3  . aspirin 81 MG EC tablet Take 81 mg by mouth daily.      Marland Kitchen atorvastatin (LIPITOR) 80 MG tablet Take 1 tablet (80 mg total) by mouth daily. Overdue for yearly physical w/labs must see MD for refills 30 tablet 0  . carvedilol (COREG) 25 MG tablet Take 1 tablet (25 mg total) by mouth 2 (two) times daily with a meal. 180 tablet 3  . Cholecalciferol 1000 UNITS tablet Take 1,000 Units by mouth daily.      . cyclobenzaprine (FLEXERIL) 5 MG tablet Take 1 tablet (5 mg total) by mouth at bedtime. 30 tablet 0  . diphenhydrAMINE (BENADRYL) 25 MG tablet Take 2 tablets (50 mg total) by mouth every 6 (six) hours as needed (swelling). 60 tablet 0  . famotidine (PEPCID) 20 MG tablet Take 1 tablet (20 mg total) by mouth daily. 30 tablet 0  . hydrochlorothiazide (HYDRODIURIL) 25 MG tablet Take 1 tablet (25 mg total) by mouth daily. 60 tablet 0  . ibuprofen (ADVIL,MOTRIN) 600 MG tablet TAKE 1 TABLET (600 MG TOTAL) BY MOUTH 2 (TWO) TIMES DAILY AS NEEDED FOR PAIN. 60 tablet 1  . nitroGLYCERIN (NITROSTAT) 0.4 MG SL tablet Place 1 tablet (0.4 mg total) under the tongue every 5 (five) minutes as needed for chest pain. 25 tablet 1  . pantoprazole (PROTONIX) 40 MG tablet Take 1 tablet (40 mg total) by mouth daily. 90 tablet 3  . potassium chloride (KLOR-CON) 8 MEQ tablet Take 1 tablet (8 mEq total) by mouth daily. 90 tablet 3  . predniSONE (DELTASONE) 10 MG tablet Take 5 tablets (50 mg total) by mouth daily. 20 tablet 0  . traMADol (ULTRAM) 50 MG tablet Take 1-2 tablets (50-100 mg total) by mouth 2 (two) times daily as needed for  pain. 60 tablet 1  . losartan-hydrochlorothiazide (HYZAAR) 100-25 MG tablet Take 1 tablet by mouth daily. For high blood pressure (Patient not taking: Reported on 07/09/2016) 90 tablet 3   No facility-administered medications prior to visit.     ROS Review of Systems  Constitutional: Negative for appetite change, fatigue and unexpected weight change.  HENT: Negative for congestion, nosebleeds, sneezing, sore throat and trouble swallowing.   Eyes: Negative for itching and visual disturbance.  Respiratory: Positive for chest tightness. Negative for cough.   Cardiovascular: Negative for chest pain, palpitations and leg swelling.  Gastrointestinal: Negative for abdominal distention, blood in stool, diarrhea and nausea.  Genitourinary: Negative for frequency and hematuria.  Musculoskeletal: Negative for back pain, gait problem, joint swelling and neck pain.  Skin: Negative for rash.  Neurological: Negative for dizziness, tremors, speech difficulty and weakness.  Psychiatric/Behavioral: Negative for agitation, dysphoric mood and sleep disturbance. The patient is not nervous/anxious.     Objective:  BP 120/84   Pulse 82   Wt 246 lb (111.6 kg)   SpO2 99%   BMI 36.33 kg/m   BP Readings from Last 3 Encounters:  07/09/16 120/84  01/18/16 121/64  09/17/15 (!) 136/92    Wt Readings from Last 3 Encounters:  07/09/16 246  lb (111.6 kg)  01/18/16 248 lb (112.5 kg)  09/17/15 251 lb (113.9 kg)    Physical Exam  Constitutional: He is oriented to person, place, and time. He appears well-developed. No distress.  NAD  HENT:  Mouth/Throat: Oropharynx is clear and moist.  Eyes: Conjunctivae are normal. Pupils are equal, round, and reactive to light.  Neck: Normal range of motion. No JVD present. No thyromegaly present.  Cardiovascular: Normal rate, regular rhythm, normal heart sounds and intact distal pulses.  Exam reveals no gallop and no friction rub.   No murmur heard. Pulmonary/Chest:  Effort normal and breath sounds normal. No respiratory distress. He has no wheezes. He has no rales. He exhibits no tenderness.  Abdominal: Soft. Bowel sounds are normal. He exhibits no distension and no mass. There is no tenderness. There is no rebound and no guarding.  Musculoskeletal: Normal range of motion. He exhibits no edema or tenderness.  Lymphadenopathy:    He has no cervical adenopathy.  Neurological: He is alert and oriented to person, place, and time. He has normal reflexes. No cranial nerve deficit. He exhibits normal muscle tone. He displays a negative Romberg sign. Coordination and gait normal.  Skin: Skin is warm and dry. No rash noted.  Psychiatric: He has a normal mood and affect. His behavior is normal. Judgment and thought content normal.  obese  Lab Results  Component Value Date   WBC 6.8 03/17/2015   HGB 13.1 03/17/2015   HCT 39.9 03/17/2015   PLT 168.0 03/17/2015   GLUCOSE 101 (H) 09/17/2015   CHOL 171 03/17/2015   TRIG 150.0 (H) 03/17/2015   HDL 27.70 (L) 03/17/2015   LDLDIRECT 199.5 07/25/2012   LDLCALC 113 (H) 03/17/2015   ALT 11 03/17/2015   AST 14 03/17/2015   NA 141 09/17/2015   K 3.9 09/17/2015   CL 103 09/17/2015   CREATININE 1.10 09/17/2015   BUN 15 09/17/2015   CO2 31 09/17/2015   TSH 3.62 03/17/2015   PSA 4.11 (H) 09/17/2015   HGBA1C 6.4 07/25/2012    No results found.  Assessment & Plan:   There are no diagnoses linked to this encounter. I have discontinued Mr. Dague losartan-hydrochlorothiazide. I am also having him maintain his aspirin, Cholecalciferol, nitroGLYCERIN, ibuprofen, traMADol, cyclobenzaprine, potassium chloride, pantoprazole, carvedilol, amLODipine, hydrochlorothiazide, predniSONE, famotidine, diphenhydrAMINE, atorvastatin, and clopidogrel.  Meds ordered this encounter  Medications  . clopidogrel (PLAVIX) 75 MG tablet    Sig: Take 75 mg by mouth daily.     Follow-up: No Follow-up on file.  Walker Kehr, MD

## 2016-07-09 NOTE — Progress Notes (Signed)
Pre visit review using our clinic review tool, if applicable. No additional management support is needed unless otherwise documented below in the visit note. 

## 2016-07-09 NOTE — Assessment & Plan Note (Addendum)
Card ref Dr Sallyanne Kuster NTG prn

## 2016-07-09 NOTE — Assessment & Plan Note (Signed)
Isosorbide, Hyzaar, Norvasc

## 2016-07-09 NOTE — Assessment & Plan Note (Signed)
PSA

## 2016-08-16 ENCOUNTER — Encounter: Payer: Self-pay | Admitting: *Deleted

## 2016-08-16 ENCOUNTER — Ambulatory Visit (INDEPENDENT_AMBULATORY_CARE_PROVIDER_SITE_OTHER): Payer: 59 | Admitting: Student

## 2016-08-16 ENCOUNTER — Encounter: Payer: Self-pay | Admitting: Student

## 2016-08-16 ENCOUNTER — Ambulatory Visit: Payer: 59 | Admitting: Cardiovascular Disease

## 2016-08-16 VITALS — BP 146/96 | HR 76 | Ht 69.0 in | Wt 246.8 lb

## 2016-08-16 DIAGNOSIS — I10 Essential (primary) hypertension: Secondary | ICD-10-CM

## 2016-08-16 DIAGNOSIS — E782 Mixed hyperlipidemia: Secondary | ICD-10-CM | POA: Diagnosis not present

## 2016-08-16 DIAGNOSIS — I25110 Atherosclerotic heart disease of native coronary artery with unstable angina pectoris: Secondary | ICD-10-CM

## 2016-08-16 MED ORDER — ISOSORBIDE MONONITRATE ER 30 MG PO TB24: 30.0000 mg | ORAL_TABLET | Freq: Every day | ORAL | 6 refills | Status: DC

## 2016-08-16 NOTE — Patient Instructions (Addendum)
Medication Instructions:  Your physician recommends that you continue on your current medications as directed. Please refer to the Current Medication list given to you today.    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   Testing/Procedures: Your physician has requested that you have a cardiac catheterization. Cardiac catheterization is used to diagnose and/or treat various heart conditions. Doctors may recommend this procedure for a number of different reasons. The most common reason is to evaluate chest pain. Chest pain can be a symptom of coronary artery disease (CAD), and cardiac catheterization can show whether plaque is narrowing or blocking your heart's arteries. This procedure is also used to evaluate the valves, as well as measure the blood flow and oxygen levels in different parts of your heart. For further information please visit [email or web address]. Please follow instruction sheet, as given. Scheduled for January 17,2018  Follow-Up: Your physician recommends that you schedule a follow-up appointment in: one monthe with Dr. Martinique or Bernerd Pho, [place].     Any Other Special Instructions Will Be Listed Below (If Applicable).     If you need a refill on your cardiac medications before your next appointment, please call your pharmacy.

## 2016-08-16 NOTE — Progress Notes (Signed)
Cardiology Office Note    Date:  08/16/2016   ID:  Anthony Chapman, DOB 1951-08-08, MRN [id number]  PCP:  Walker Kehr, MD  Cardiologist: Previously Darrel Reach - Now Dr. Martinique with [facility]   Chief Complaint  Patient presents with  . New Evaluation    pt c/o chest pain and 2nd opinion, pt takes up to 2 nitro pills per day     History of Present Illness:    Anthony Chapman is a 66 y.o. male with past medical history of CAD, Type 2 DM, HTN, HLD, and known LBBB who presents to the office as a new patient for evaluation of recurrent chest pain.   By review of Care Everywhere, he was previously followed by Dr. Beatrix Fetters with [facility] but wishes to transition his care to [facility]. He has known CAD with previous stent placement to the RCA in 2010 and last cath in 2015 with PCI to the RCA (2.54mm x92mm Resolute DES) and LCx (3.83mm x31mm Resolute DES). Underwent stress testing in 2016 which showed no evidence of ischemia.   In talking with the patient today, he reports having episodes of chest pain with exertion for the past several months. Says this has increased in frequency and severity. He goes to the mall multiple times a week for exercise, but reports his activity is limited secondary to dyspnea with exertion. Has also experienced chest pain with walking around the grocery store. His pain usually lasts for 5-10 minutes and is relieved with sitting down and resting or taking a SL NTG. He is using approximately 1 bottle of NTG per month. Pain radiates into his jaw at times. Denies any associated nausea, vomiting, or diaphoresis. Was on Imdur in the past but says this exacerbated his chest pain.   Reports occasional episodes of chest discomfort which occurs when he is anxious but this feels different than his usual symptoms.   Current medication regimen includes ASA, Plavix, Lipitor, Amlodipine, Coreg, and HCTZ.   Reports a 30 pack year history but quit in the early 2000's.  Family history is significant for CAD in his mother, maternal aunts, and maternal uncles.    Past Medical History:  Diagnosis Date  . Arthritis   . CAD (coronary artery disease)    a.  stent placement to the RCA in 2010 b. cath in 2015 with PCI to the RCA (2.68mm x68mm Resolute DES) and LCx (3.61mm x80mm Resolute DES).   . ED (erectile dysfunction)   . HTN (hypertension)   . Hyperlipemia     Past Surgical History:  Procedure Laterality Date  . STENTS  2010    Current Medications: Outpatient Medications Prior to Visit  Medication Sig Dispense Refill  . amLODipine (NORVASC) 10 MG tablet Take 1 tablet (10 mg total) by mouth daily. 90 tablet 3  . aspirin 81 MG EC tablet Take 81 mg by mouth every other day.     Marland Kitchen atorvastatin (LIPITOR) 80 MG tablet Take 1 tablet (80 mg total) by mouth daily. Overdue for yearly physical w/labs must see MD for refills 30 tablet 0  . carvedilol (COREG) 25 MG tablet Take 1 tablet (25 mg total) by mouth 2 (two) times daily with a meal. 180 tablet 3  . Cholecalciferol 1000 UNITS tablet Take 1,000 Units by mouth daily.      . clopidogrel (PLAVIX) 75 MG tablet Take 75 mg by mouth daily.    . hydrochlorothiazide (HYDRODIURIL) 25 MG tablet Take 1 tablet (  25 mg total) by mouth daily. 60 tablet 0  . ibuprofen (ADVIL,MOTRIN) 600 MG tablet TAKE 1 TABLET (600 MG TOTAL) BY MOUTH 2 (TWO) TIMES DAILY AS NEEDED FOR PAIN. 60 tablet 1  . nitroGLYCERIN (NITROSTAT) 0.4 MG SL tablet Place 1 tablet (0.4 mg total) under the tongue every 5 (five) minutes as needed for chest pain. 25 tablet 1  . potassium chloride (KLOR-CON) 8 MEQ tablet Take 1 tablet (8 mEq total) by mouth daily. 90 tablet 3  . cyclobenzaprine (FLEXERIL) 5 MG tablet Take 1 tablet (5 mg total) by mouth at bedtime. (Patient not taking: Reported on 08/16/2016) 30 tablet 0  . diphenhydrAMINE (BENADRYL) 25 MG tablet Take 2 tablets (50 mg total) by mouth every 6 (six) hours as needed (swelling). (Patient not taking: Reported  on 08/16/2016) 60 tablet 0  . famotidine (PEPCID) 20 MG tablet Take 1 tablet (20 mg total) by mouth daily. (Patient not taking: Reported on 08/16/2016) 30 tablet 0  . pantoprazole (PROTONIX) 40 MG tablet Take 1 tablet (40 mg total) by mouth daily. (Patient not taking: Reported on 08/16/2016) 90 tablet 3  . predniSONE (DELTASONE) 10 MG tablet Take 5 tablets (50 mg total) by mouth daily. (Patient not taking: Reported on 08/16/2016) 20 tablet 0  . traMADol (ULTRAM) 50 MG tablet Take 1-2 tablets (50-100 mg total) by mouth 2 (two) times daily as needed for pain. (Patient not taking: Reported on 08/16/2016) 60 tablet 1   No facility-administered medications prior to visit.      Allergies:   Losartan   Social History   Social History  . Marital status: Married    Spouse name: N/A  . Number of children: N/A  . 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 Smoking status: Former Smoker    Packs/day: 1.00    Years: 30.00    Types: Cigarettes    Quit date: 08/16/1998  . Smokeless tobacco: Never Used  . Alcohol use No  . Drug use: No  . Sexual activity: Yes   Other Topics Concern  . None   Social History Narrative   Occupation: 2nd shift   Married.           Family History:  The patient's family history includes Arthritis in his father; CAD in his maternal aunt and mother; Hypertension in his father and mother.   Review of Systems:   Please see the history of present illness.     General:  No chills, fever, night sweats or weight changes.  Cardiovascular:  No edema, orthopnea, palpitations, paroxysmal nocturnal dyspnea. Positive for chest pain and dyspnea on exertion.  Dermatological: No rash, lesions/masses Respiratory: No cough, dyspnea Urologic: No hematuria, dysuria Abdominal:   No nausea, vomiting, diarrhea, bright red blood per rectum, melena, or hematemesis Neurologic:  No visual changes, wkns, changes in mental status. All other  systems reviewed and are otherwise negative except as noted above.   Physical Exam:    VS:  BP (!) 146/96 (BP Location: Left Arm, Patient Position: Sitting, Cuff Size: Large)   Pulse 76   Ht 5\' 9"  (1.753 m)   Wt 246 lb 12.8 oz (111.9 kg)   BMI 36.45 kg/m    General: Well developed, overweight African American male appearing in no acute distress. Head: Normocephalic, atraumatic, sclera non-icteric, no xanthomas, nares are without discharge.  Neck: No carotid bruits. JVD not elevated.  Lungs: Respirations regular and unlabored,  without wheezes or rales.  Heart: Regular rate and rhythm. No S3 or S4.  No murmur, no rubs, or gallops appreciated. Abdomen: Soft, non-tender, non-distended with normoactive bowel sounds. No hepatomegaly. No rebound/guarding. No obvious abdominal masses. Msk:  Strength and tone appear normal for age. No joint deformities or effusions. Extremities: No clubbing or cyanosis. No edema.  Distal pedal pulses are 2+ bilaterally. Neuro: Alert and oriented X 3. Moves all extremities spontaneously. No focal deficits noted. Psych:  Responds to questions appropriately with a normal affect. Skin: No rashes or lesions noted  Wt Readings from Last 3 Encounters:  08/16/16 246 lb 12.8 oz (111.9 kg)  07/09/16 246 lb (111.6 kg)  01/18/16 248 lb (112.5 kg)     Studies/Labs Reviewed:   EKG:  EKG is ordered today. The ekg ordered today demonstrates NSR, HR 77, with known LBBB. Artifact and LBBB prohibit further interpretation.   Recent Labs: 07/09/2016: ALT 13; BUN 14; Creatinine, Ser 0.91; Hemoglobin 13.8; Platelets 170.0; Potassium 4.0; Sodium 141; TSH 3.94   Lipid Panel    Component Value Date/Time   CHOL 161 07/09/2016 1013   TRIG 96.0 07/09/2016 1013   HDL 28.90 (L) 07/09/2016 1013   CHOLHDL 6 07/09/2016 1013   VLDL 19.2 07/09/2016 1013   LDLCALC 113 (H) 07/09/2016 1013   LDLDIRECT 199.5 07/25/2012 0935    Additional studies/ records that were reviewed today  include:   Cardiac Catheterization Stent Cards: Provided by Patient: 2015 with PCI to the RCA (2.45mm x22mm Resolute DES) and LCx (3.46mm x13mm Resolute DES)   Assessment:    1. Atherosclerosis of native coronary artery of native heart with unstable angina pectoris ([place])   2. Mixed hyperlipidemia   3. Essential hypertension      Plan:   In order of problems listed above:  1. CAD with Unstable Angina - has known CAD with previous stent placement in 2010 to the RCA and DES to RCA and LCx in 2015.  - has continued to experience episodes of chest pain and dyspnea with exertion for the past several months, which lasts for 5-10 minutes and is relieved with sitting down and resting or taking a SL NTG. He is using approximately 1 bottle of NTG per month.  - EKG today shows his known LBBB with no acute ischemic changes.  - with his presenting symptoms being concerning for unstable angina in the setting of known CAD, a cardiac catheterization is recommended for definitive evaluation. The patient understands that risks include but are not limited to stroke (1 in 1000), death (1 in 62), kidney failure [usually temporary] (1 in 500), bleeding (1 in 200), allergic reaction [possibly serious] (1 in 200). The assessment and plan was discussed with Dr. Martinique (DOD). Will check pre-cath labs 7-days prior to the procedure.  - continue ASA, Plavix, Lipitor, Amlodipine, and Coreg. Reports being intolerant to Imdur in the past therefore will not add this to his current regimen.   2. Mixed HLD - Lipid Panel in 07/2016 showed total cholesterol 161, HDL 28, and LDL 113. - continue Atorvastatin 80mg  daily with goal LDL < 70. If LDL remains elevated, consider Zetia.   3. Essential HTN - BP at 151/98 today. Reports systolic readings in the 123456 or less at home or when at the Pharmacy.  - continue Coreg and HCTZ.     Medication Adjustments/Labs and Tests Ordered: Current medicines are reviewed at length with  the patient today.  Concerns regarding medicines are outlined above.  Medication  changes, Labs and Tests ordered today are listed in the Patient Instructions below. Patient Instructions  Medication Instructions:  Your physician recommends that you continue on your current medications as directed. Please refer to the Current Medication list given to you today.  Labwork: Lab work to be done Architectural technologist.--BMP, CBC, PT,PTT--go to [facility] lab on the first floor for this lab work.   Testing/Procedures: Your physician has requested that you have a cardiac catheterization. Cardiac catheterization is used to diagnose and/or treat various heart conditions. Doctors may recommend this procedure for a number of different reasons. The most common reason is to evaluate chest pain. Chest pain can be a symptom of coronary artery disease (CAD), and cardiac catheterization can show whether plaque is narrowing or blocking your heart's arteries. This procedure is also used to evaluate the valves, as well as measure the blood flow and oxygen levels in different parts of your heart. For further information please visit [email or web address][facility][email or web address]. Please follow instruction sheet, as given. Scheduled for January 17,2018  Follow-Up: Your physician recommends that you schedule a follow-up appointment in: one month with Dr. Martinique or Bernerd Pho, [place].    Any Other Special Instructions Will Be Listed Below (If Applicable).   If you need a refill on your cardiac medications before your next appointment, please call your pharmacy.   Arna Medici, [place]  08/16/2016 5:41 PM    [facility], [place], [place]  [address] Phone: [phone]; Fax: [phone]  [address] [place], [place] [address] Phone: [phone]

## 2016-08-17 LAB — CBC WITH DIFFERENTIAL/PLATELET
Basophils Absolute: 57 cells/uL (ref 0–200)
Basophils Relative: 1 %
Eosinophils Absolute: 342 cells/uL (ref 15–500)
Eosinophils Relative: 6 %
HCT: 42.9 % (ref 38.5–50.0)
Hemoglobin: 13.7 g/dL (ref 13.2–17.1)
Lymphocytes Relative: 23 %
Lymphs Abs: 1311 cells/uL (ref 850–3900)
MCH: 25.8 pg — ABNORMAL LOW (ref 27.0–33.0)
MCHC: 31.9 g/dL — ABNORMAL LOW (ref 32.0–36.0)
MCV: 80.6 fL (ref 80.0–100.0)
MPV: 9.6 fL (ref 7.5–12.5)
Monocytes Absolute: 570 cells/uL (ref 200–950)
Monocytes Relative: 10 %
Neutro Abs: 3420 cells/uL (ref 1500–7800)
Neutrophils Relative %: 60 %
Platelets: 153 10*3/uL (ref 140–400)
RBC: 5.32 MIL/uL (ref 4.20–5.80)
RDW: 16 % — ABNORMAL HIGH (ref 11.0–15.0)
WBC: 5.7 10*3/uL (ref 3.8–10.8)

## 2016-08-18 LAB — BASIC METABOLIC PANEL
BUN: 15 mg/dL (ref 7–25)
CO2: 26 mmol/L (ref 20–31)
Calcium: 8.7 mg/dL (ref 8.6–10.3)
Chloride: 105 mmol/L (ref 98–110)
Creat: 0.95 mg/dL (ref 0.70–1.25)
Glucose, Bld: 119 mg/dL — ABNORMAL HIGH (ref 65–99)
Potassium: 3.9 mmol/L (ref 3.5–5.3)
Sodium: 141 mmol/L (ref 135–146)

## 2016-08-18 LAB — PROTIME-INR
INR: 1.1
Prothrombin Time: 11.3 s (ref 9.0–11.5)

## 2016-08-18 LAB — APTT: aPTT: 30 s (ref 22–34)

## 2016-08-19 ENCOUNTER — Telehealth: Payer: Self-pay | Admitting: Student

## 2016-08-19 NOTE — Telephone Encounter (Signed)
Returned call to patient-made aware of results and recommendations:  Notes Recorded by Erma Heritage, PA on 08/18/2016 at 7:29 AM EST Please let the patient know his pre-cardiac catheterization labs look stable. Kidney function and electrolytes within normal limits. No evidence of bleeding disorders or anemia. Thank you!   Pt aware and verbalized understanding.

## 2016-08-19 NOTE — Telephone Encounter (Signed)
°  Follow Up   Pt is calling following of lab results. Please call.

## 2016-08-25 ENCOUNTER — Encounter (HOSPITAL_COMMUNITY): Admission: RE | Payer: Self-pay | Source: Ambulatory Visit

## 2016-08-25 ENCOUNTER — Ambulatory Visit (HOSPITAL_COMMUNITY): Admission: RE | Admit: 2016-08-25 | Payer: 59 | Source: Ambulatory Visit | Admitting: Cardiology

## 2016-08-25 SURGERY — LEFT HEART CATH AND CORONARY ANGIOGRAPHY

## 2016-08-27 ENCOUNTER — Encounter (HOSPITAL_COMMUNITY)
Admission: RE | Disposition: A | Discharge status: Home / Self Care | Payer: Self-pay | Source: Ambulatory Visit | Attending: Cardiology

## 2016-08-27 ENCOUNTER — Encounter (HOSPITAL_COMMUNITY): Payer: Self-pay | Admitting: General Practice

## 2016-08-27 ENCOUNTER — Other Ambulatory Visit: Payer: Self-pay

## 2016-08-27 ENCOUNTER — Ambulatory Visit (HOSPITAL_COMMUNITY)
Admission: RE | Admit: 2016-08-27 | Discharge: 2016-08-28 | Disposition: A | Discharge status: Home / Self Care | Payer: 59 | Source: Ambulatory Visit | Attending: Cardiology | Admitting: Cardiology

## 2016-08-27 DIAGNOSIS — N529 Male erectile dysfunction, unspecified: Secondary | ICD-10-CM | POA: Diagnosis not present

## 2016-08-27 DIAGNOSIS — E119 Type 2 diabetes mellitus without complications: Secondary | ICD-10-CM | POA: Insufficient documentation

## 2016-08-27 DIAGNOSIS — Z7902 Long term (current) use of antithrombotics/antiplatelets: Secondary | ICD-10-CM | POA: Diagnosis not present

## 2016-08-27 DIAGNOSIS — E785 Hyperlipidemia, unspecified: Secondary | ICD-10-CM | POA: Diagnosis present

## 2016-08-27 DIAGNOSIS — M1990 Unspecified osteoarthritis, unspecified site: Secondary | ICD-10-CM | POA: Insufficient documentation

## 2016-08-27 DIAGNOSIS — I2510 Atherosclerotic heart disease of native coronary artery without angina pectoris: Secondary | ICD-10-CM

## 2016-08-27 DIAGNOSIS — E782 Mixed hyperlipidemia: Secondary | ICD-10-CM | POA: Diagnosis not present

## 2016-08-27 DIAGNOSIS — Y712 Prosthetic and other implants, materials and accessory cardiovascular devices associated with adverse incidents: Secondary | ICD-10-CM | POA: Diagnosis not present

## 2016-08-27 DIAGNOSIS — I2582 Chronic total occlusion of coronary artery: Secondary | ICD-10-CM | POA: Diagnosis not present

## 2016-08-27 DIAGNOSIS — Z9861 Coronary angioplasty status: Secondary | ICD-10-CM

## 2016-08-27 DIAGNOSIS — Z7982 Long term (current) use of aspirin: Secondary | ICD-10-CM | POA: Diagnosis not present

## 2016-08-27 DIAGNOSIS — T82855A Stenosis of coronary artery stent, initial encounter: Secondary | ICD-10-CM | POA: Insufficient documentation

## 2016-08-27 DIAGNOSIS — Z8249 Family history of ischemic heart disease and other diseases of the circulatory system: Secondary | ICD-10-CM | POA: Insufficient documentation

## 2016-08-27 DIAGNOSIS — I10 Essential (primary) hypertension: Secondary | ICD-10-CM | POA: Diagnosis not present

## 2016-08-27 DIAGNOSIS — I447 Left bundle-branch block, unspecified: Secondary | ICD-10-CM | POA: Insufficient documentation

## 2016-08-27 DIAGNOSIS — Z87891 Personal history of nicotine dependence: Secondary | ICD-10-CM | POA: Insufficient documentation

## 2016-08-27 DIAGNOSIS — Z7952 Long term (current) use of systemic steroids: Secondary | ICD-10-CM | POA: Diagnosis not present

## 2016-08-27 DIAGNOSIS — I25110 Atherosclerotic heart disease of native coronary artery with unstable angina pectoris: Secondary | ICD-10-CM | POA: Diagnosis not present

## 2016-08-27 DIAGNOSIS — I200 Unstable angina: Secondary | ICD-10-CM | POA: Diagnosis present

## 2016-08-27 HISTORY — DX: Gastro-esophageal reflux disease without esophagitis: K21.9

## 2016-08-27 HISTORY — DX: Acute myocardial infarction, unspecified: I21.9

## 2016-08-27 HISTORY — DX: Angina pectoris, unspecified: I20.9

## 2016-08-27 HISTORY — PX: CARDIAC CATHETERIZATION: SHX172

## 2016-08-27 LAB — POCT ACTIVATED CLOTTING TIME: Activated Clotting Time: 318 seconds

## 2016-08-27 SURGERY — LEFT HEART CATH AND CORONARY ANGIOGRAPHY
Anesthesia: LOCAL

## 2016-08-27 MED ORDER — VITAMIN D 1000 UNITS PO TABS
1000.0000 [IU] | ORAL_TABLET | ORAL | Status: DC
  Administered 2016-08-28: 1000 [IU] via ORAL
  Filled 2016-08-27 (×2): qty 1

## 2016-08-27 MED ORDER — HEPARIN (PORCINE) IN NACL 2-0.9 UNIT/ML-% IJ SOLN
INTRAMUSCULAR | Status: AC
  Filled 2016-08-27: qty 1000

## 2016-08-27 MED ORDER — HYDROCHLOROTHIAZIDE 25 MG PO TABS
25.0000 mg | ORAL_TABLET | Freq: Every day | ORAL | Status: DC
  Administered 2016-08-28: 09:00:00 25 mg via ORAL
  Filled 2016-08-27: qty 1

## 2016-08-27 MED ORDER — LIDOCAINE HCL (PF) 1 % IJ SOLN
INTRAMUSCULAR | Status: AC
  Filled 2016-08-27: qty 30

## 2016-08-27 MED ORDER — SODIUM CHLORIDE 0.9% FLUSH
3.0000 mL | Freq: Two times a day (BID) | INTRAVENOUS | Status: DC
  Administered 2016-08-27: 3 mL via INTRAVENOUS

## 2016-08-27 MED ORDER — CLOPIDOGREL BISULFATE 300 MG PO TABS
ORAL_TABLET | ORAL | Status: AC
  Filled 2016-08-27: qty 1

## 2016-08-27 MED ORDER — MIDAZOLAM HCL 2 MG/2ML IJ SOLN
INTRAMUSCULAR | Status: DC | PRN
  Administered 2016-08-27 (×3): 1 mg via INTRAVENOUS

## 2016-08-27 MED ORDER — CLOPIDOGREL BISULFATE 75 MG PO TABS
75.0000 mg | ORAL_TABLET | Freq: Every day | ORAL | Status: DC
  Administered 2016-08-28: 75 mg via ORAL
  Filled 2016-08-27: qty 1

## 2016-08-27 MED ORDER — IOPAMIDOL (ISOVUE-370) INJECTION 76%
INTRAVENOUS | Status: AC
  Filled 2016-08-27: qty 50

## 2016-08-27 MED ORDER — NAPHAZOLINE-GLYCERIN 0.012-0.2 % OP SOLN
1.0000 [drp] | Freq: Three times a day (TID) | OPHTHALMIC | Status: DC | PRN
  Filled 2016-08-27: qty 15

## 2016-08-27 MED ORDER — LIDOCAINE HCL (PF) 1 % IJ SOLN
INTRAMUSCULAR | Status: DC | PRN
  Administered 2016-08-27: 2 mL

## 2016-08-27 MED ORDER — ASPIRIN 81 MG PO CHEW
CHEWABLE_TABLET | ORAL | Status: AC
  Administered 2016-08-27: 81 mg via ORAL
  Filled 2016-08-27: qty 1

## 2016-08-27 MED ORDER — SODIUM CHLORIDE 0.9 % IV SOLN
INTRAVENOUS | Status: AC
  Administered 2016-08-27: 15:00:00 via INTRAVENOUS

## 2016-08-27 MED ORDER — MIDAZOLAM HCL 2 MG/2ML IJ SOLN
INTRAMUSCULAR | Status: AC
  Filled 2016-08-27: qty 2

## 2016-08-27 MED ORDER — ACETAMINOPHEN 325 MG PO TABS
650.0000 mg | ORAL_TABLET | ORAL | Status: DC | PRN
  Administered 2016-08-27 (×2): 650 mg via ORAL
  Filled 2016-08-27 (×2): qty 2

## 2016-08-27 MED ORDER — IOPAMIDOL (ISOVUE-370) INJECTION 76%
INTRAVENOUS | Status: DC | PRN
  Administered 2016-08-27: 180 mL via INTRAVENOUS

## 2016-08-27 MED ORDER — HYDRALAZINE HCL 25 MG PO TABS
25.0000 mg | ORAL_TABLET | Freq: Two times a day (BID) | ORAL | Status: DC
  Administered 2016-08-27 – 2016-08-28 (×2): 25 mg via ORAL
  Filled 2016-08-27 (×2): qty 1

## 2016-08-27 MED ORDER — IOPAMIDOL (ISOVUE-370) INJECTION 76%
INTRAVENOUS | Status: AC
  Filled 2016-08-27: qty 100

## 2016-08-27 MED ORDER — ASPIRIN 81 MG PO CHEW
81.0000 mg | CHEWABLE_TABLET | ORAL | Status: AC
  Administered 2016-08-27: 81 mg via ORAL

## 2016-08-27 MED ORDER — HEPARIN SODIUM (PORCINE) 1000 UNIT/ML IJ SOLN
INTRAMUSCULAR | Status: DC | PRN
  Administered 2016-08-27 (×2): 6000 [IU] via INTRAVENOUS

## 2016-08-27 MED ORDER — HEPARIN SODIUM (PORCINE) 1000 UNIT/ML IJ SOLN
INTRAMUSCULAR | Status: AC
  Filled 2016-08-27: qty 1

## 2016-08-27 MED ORDER — SODIUM CHLORIDE 0.9% FLUSH: 3.0000 mL | Freq: Two times a day (BID) | INTRAVENOUS | Status: DC

## 2016-08-27 MED ORDER — CARVEDILOL 12.5 MG PO TABS
25.0000 mg | ORAL_TABLET | Freq: Two times a day (BID) | ORAL | Status: DC
  Administered 2016-08-27 – 2016-08-28 (×2): 25 mg via ORAL
  Filled 2016-08-27 (×2): qty 2

## 2016-08-27 MED ORDER — VERAPAMIL HCL 2.5 MG/ML IV SOLN
INTRAVENOUS | Status: AC
  Filled 2016-08-27: qty 2

## 2016-08-27 MED ORDER — ANGIOPLASTY BOOK
Freq: Once | Status: AC
  Administered 2016-08-27: 22:00:00
  Filled 2016-08-27: qty 1

## 2016-08-27 MED ORDER — CLOPIDOGREL BISULFATE 300 MG PO TABS
ORAL_TABLET | ORAL | Status: DC | PRN
  Administered 2016-08-27: 300 mg via ORAL

## 2016-08-27 MED ORDER — HYDRALAZINE HCL 20 MG/ML IJ SOLN: 5.0000 mg | INTRAMUSCULAR | Status: AC | PRN

## 2016-08-27 MED ORDER — HEPARIN (PORCINE) IN NACL 2-0.9 UNIT/ML-% IJ SOLN
INTRAMUSCULAR | Status: DC | PRN
  Administered 2016-08-27: 1000 mL

## 2016-08-27 MED ORDER — FENTANYL CITRATE (PF) 100 MCG/2ML IJ SOLN
INTRAMUSCULAR | Status: AC
  Filled 2016-08-27: qty 2

## 2016-08-27 MED ORDER — SODIUM CHLORIDE 0.9 % WEIGHT BASED INFUSION: 1.0000 mL/kg/h | INTRAVENOUS | Status: DC

## 2016-08-27 MED ORDER — VERAPAMIL HCL 2.5 MG/ML IV SOLN
INTRAVENOUS | Status: DC | PRN
  Administered 2016-08-27: 10 mL via INTRA_ARTERIAL

## 2016-08-27 MED ORDER — NITROGLYCERIN 1 MG/10 ML FOR IR/CATH LAB
INTRA_ARTERIAL | Status: DC | PRN
  Administered 2016-08-27: 200 ug via INTRACORONARY

## 2016-08-27 MED ORDER — FENTANYL CITRATE (PF) 100 MCG/2ML IJ SOLN
INTRAMUSCULAR | Status: DC | PRN
  Administered 2016-08-27: 25 ug via INTRAVENOUS
  Administered 2016-08-27: 50 ug via INTRAVENOUS
  Administered 2016-08-27: 25 ug via INTRAVENOUS

## 2016-08-27 MED ORDER — NITROGLYCERIN 1 MG/10 ML FOR IR/CATH LAB
INTRA_ARTERIAL | Status: AC
  Filled 2016-08-27: qty 10

## 2016-08-27 MED ORDER — SODIUM CHLORIDE 0.9 % IV SOLN: 250.0000 mL | INTRAVENOUS | Status: DC | PRN

## 2016-08-27 MED ORDER — NITROGLYCERIN 0.4 MG SL SUBL: 0.4000 mg | SUBLINGUAL_TABLET | SUBLINGUAL | Status: DC | PRN

## 2016-08-27 MED ORDER — ONDANSETRON HCL 4 MG/2ML IJ SOLN: 4.0000 mg | Freq: Four times a day (QID) | INTRAMUSCULAR | Status: DC | PRN

## 2016-08-27 MED ORDER — NAPROXEN SODIUM 275 MG PO TABS
275.0000 mg | ORAL_TABLET | Freq: Three times a day (TID) | ORAL | Status: DC | PRN
  Filled 2016-08-27: qty 1

## 2016-08-27 MED ORDER — SODIUM CHLORIDE 0.9% FLUSH: 3.0000 mL | INTRAVENOUS | Status: DC | PRN

## 2016-08-27 MED ORDER — SODIUM CHLORIDE 0.9 % WEIGHT BASED INFUSION
3.0000 mL/kg/h | INTRAVENOUS | Status: DC
  Administered 2016-08-27: 3 mL/kg/h via INTRAVENOUS

## 2016-08-27 MED ORDER — AMLODIPINE BESYLATE 10 MG PO TABS
10.0000 mg | ORAL_TABLET | Freq: Every day | ORAL | Status: DC
  Administered 2016-08-28: 10 mg via ORAL
  Filled 2016-08-27: qty 1

## 2016-08-27 MED ORDER — ATORVASTATIN CALCIUM 80 MG PO TABS
80.0000 mg | ORAL_TABLET | Freq: Every day | ORAL | Status: DC
  Administered 2016-08-28: 80 mg via ORAL
  Filled 2016-08-27: qty 1

## 2016-08-27 MED ORDER — POTASSIUM CHLORIDE ER 8 MEQ PO TBCR
8.0000 meq | EXTENDED_RELEASE_TABLET | Freq: Every day | ORAL | Status: DC
  Administered 2016-08-27 – 2016-08-28 (×2): 8 meq via ORAL
  Filled 2016-08-27 (×2): qty 1

## 2016-08-27 MED ORDER — LABETALOL HCL 5 MG/ML IV SOLN: 10.0000 mg | INTRAVENOUS | Status: AC | PRN

## 2016-08-27 MED ORDER — ASPIRIN EC 81 MG PO TBEC
81.0000 mg | DELAYED_RELEASE_TABLET | ORAL | Status: DC
  Administered 2016-08-28: 81 mg via ORAL
  Filled 2016-08-27: qty 1

## 2016-08-27 SURGICAL SUPPLY — 19 items
BALLN MOZEC 2.50X14 (BALLOONS) ×2
BALLN ~~LOC~~ MOZEC 3.5X13 (BALLOONS) ×2
BALLOON MOZEC 2.50X14 (BALLOONS) IMPLANT
BALLOON ~~LOC~~ MOZEC 3.5X13 (BALLOONS) IMPLANT
CATH INFINITI 5FR ANG PIGTAIL (CATHETERS) ×1 IMPLANT
CATH OPTITORQUE TIG 4.0 5F (CATHETERS) ×1 IMPLANT
DEVICE RAD COMP TR BAND LRG (VASCULAR PRODUCTS) ×1 IMPLANT
GLIDESHEATH SLEND A-KIT 6F 22G (SHEATH) ×1 IMPLANT
GUIDE CATH RUNWAY 6FR CLS3.5 (CATHETERS) ×1 IMPLANT
GUIDEWIRE INQWIRE 1.5J.035X260 (WIRE) IMPLANT
INQWIRE 1.5J .035X260CM (WIRE) ×2
KIT ENCORE 26 ADVANTAGE (KITS) ×1 IMPLANT
KIT HEART LEFT (KITS) ×2 IMPLANT
PACK CARDIAC CATHETERIZATION (CUSTOM PROCEDURE TRAY) ×2 IMPLANT
STENT RESOLUTE ONYX 3.0X18 (Permanent Stent) ×1 IMPLANT
TRANSDUCER W/STOPCOCK (MISCELLANEOUS) ×2 IMPLANT
TUBING CIL FLEX 10 FLL-RA (TUBING) ×2 IMPLANT
WIRE HI TORQ BMW 190CM (WIRE) ×1 IMPLANT
WIRE LUGE 182CM (WIRE) ×1 IMPLANT

## 2016-08-27 NOTE — Progress Notes (Signed)
TR BAND REMOVAL  LOCATION:  right radial  DEFLATED PER PROTOCOL:  Yes.    TIME BAND OFF / DRESSING APPLIED:   1830   SITE UPON ARRIVAL:   Level 0  SITE AFTER BAND REMOVAL:  Level 1  CIRCULATION SENSATION AND MOVEMENT:  Within Normal Limits  Yes.    COMMENTS:  Possible small hematoma; wrapped with coban post band removal.

## 2016-08-27 NOTE — H&P (View-Only) (Signed)
Cardiology Office Note    Date:  08/16/2016   ID:  Anthony Chapman, DOB 1950/11/02, MRN [id number]  PCP:  Anthony Kehr, MD  Cardiologist: Previously Anthony Chapman - Now Anthony Chapman with Anthony Chapman   Chief Complaint  Patient presents with  . New Evaluation    pt c/o chest pain and 2nd opinion, pt takes up to 2 nitro pills per day     History of Present Illness:    Anthony Chapman is a 66 y.o. male with past medical history of CAD, Type 2 DM, HTN, HLD, and known LBBB who presents to the office as a new patient for evaluation of recurrent chest pain.   By review of Care Everywhere, he was previously followed by Anthony Chapman with Anthony Chapman but wishes to transition his care to Anthony Chapman. He has known CAD with previous stent placement to the RCA in 2010 and last cath in 2015 with PCI to the RCA (2.94mm x35mm Resolute DES) and LCx (3.20mm x75mm Resolute DES). Underwent stress testing in 2016 which showed no evidence of ischemia.   In talking with the patient today, he reports having episodes of chest pain with exertion for the past several months. Says this has increased in frequency and severity. He goes to the mall multiple times a week for exercise, but reports his activity is limited secondary to dyspnea with exertion. Has also experienced chest pain with walking around the grocery store. His pain usually lasts for 5-10 minutes and is relieved with sitting down and resting or taking a SL NTG. He is using approximately 1 bottle of NTG per month. Pain radiates into his jaw at times. Denies any associated nausea, vomiting, or diaphoresis. Was on Imdur in the past but says this exacerbated his chest pain.   Reports occasional episodes of chest discomfort which occurs when he is anxious but this feels different than his usual symptoms.   Current medication regimen includes ASA, Plavix, Lipitor, Amlodipine, Coreg, and HCTZ.   Reports a 30 pack year history but quit in the early 2000's.  Family history is significant for CAD in his mother, maternal aunts, and maternal uncles.    Past Medical History:  Diagnosis Date  . Arthritis   . CAD (coronary artery disease)    a.  stent placement to the RCA in 2010 b. cath in 2015 with PCI to the RCA (2.75mm x81mm Resolute DES) and LCx (3.6mm x21mm Resolute DES).   . ED (erectile dysfunction)   . HTN (hypertension)   . Hyperlipemia     Past Surgical History:  Procedure Laterality Date  . STENTS  2010    Current Medications: Outpatient Medications Prior to Visit  Medication Sig Dispense Refill  . amLODipine (NORVASC) 10 MG tablet Take 1 tablet (10 mg total) by mouth daily. 90 tablet 3  . aspirin 81 MG EC tablet Take 81 mg by mouth every other day.     Marland Kitchen atorvastatin (LIPITOR) 80 MG tablet Take 1 tablet (80 mg total) by mouth daily. Overdue for yearly physical w/labs must see MD for refills 30 tablet 0  . carvedilol (COREG) 25 MG tablet Take 1 tablet (25 mg total) by mouth 2 (two) times daily with a meal. 180 tablet 3  . Cholecalciferol 1000 UNITS tablet Take 1,000 Units by mouth daily.      . clopidogrel (PLAVIX) 75 MG tablet Take 75 mg by mouth daily.    . hydrochlorothiazide (HYDRODIURIL) 25 MG tablet Take 1 tablet (  25 mg total) by mouth daily. 60 tablet 0  . ibuprofen (ADVIL,MOTRIN) 600 MG tablet TAKE 1 TABLET (600 MG TOTAL) BY MOUTH 2 (TWO) TIMES DAILY AS NEEDED FOR PAIN. 60 tablet 1  . nitroGLYCERIN (NITROSTAT) 0.4 MG SL tablet Place 1 tablet (0.4 mg total) under the tongue every 5 (five) minutes as needed for chest pain. 25 tablet 1  . potassium chloride (KLOR-CON) 8 MEQ tablet Take 1 tablet (8 mEq total) by mouth daily. 90 tablet 3  . cyclobenzaprine (FLEXERIL) 5 MG tablet Take 1 tablet (5 mg total) by mouth at bedtime. (Patient not taking: Reported on 08/16/2016) 30 tablet 0  . diphenhydrAMINE (BENADRYL) 25 MG tablet Take 2 tablets (50 mg total) by mouth every 6 (six) hours as needed (swelling). (Patient not taking: Reported  on 08/16/2016) 60 tablet 0  . famotidine (PEPCID) 20 MG tablet Take 1 tablet (20 mg total) by mouth daily. (Patient not taking: Reported on 08/16/2016) 30 tablet 0  . pantoprazole (PROTONIX) 40 MG tablet Take 1 tablet (40 mg total) by mouth daily. (Patient not taking: Reported on 08/16/2016) 90 tablet 3  . predniSONE (DELTASONE) 10 MG tablet Take 5 tablets (50 mg total) by mouth daily. (Patient not taking: Reported on 08/16/2016) 20 tablet 0  . traMADol (ULTRAM) 50 MG tablet Take 1-2 tablets (50-100 mg total) by mouth 2 (two) times daily as needed for pain. (Patient not taking: Reported on 08/16/2016) 60 tablet 1   No facility-administered medications prior to visit.      Allergies:   Losartan   Social History   Social History  . Marital status: Married    Spouse name: N/A  . Number of children: N/A  . 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 Smoking status: Former Smoker    Packs/day: 1.00    Years: 30.00    Types: Cigarettes    Quit date: 08/16/1998  . Smokeless tobacco: Never Used  . Alcohol use No  . Drug use: No  . Sexual activity: Yes   Other Topics Concern  . None   Social History Narrative   Occupation: 2nd shift   Married.           Family History:  The patient's family history includes Arthritis in his father; CAD in his maternal aunt and mother; Hypertension in his father and mother.   Review of Systems:   Please see the history of present illness.     General:  No chills, fever, night sweats or weight changes.  Cardiovascular:  No edema, orthopnea, palpitations, paroxysmal nocturnal dyspnea. Positive for chest pain and dyspnea on exertion.  Dermatological: No rash, lesions/masses Respiratory: No cough, dyspnea Urologic: No hematuria, dysuria Abdominal:   No nausea, vomiting, diarrhea, bright red blood per rectum, melena, or hematemesis Neurologic:  No visual changes, wkns, changes in mental status. All other  systems reviewed and are otherwise negative except as noted above.   Physical Exam:    VS:  BP (!) 146/96 (BP Location: Left Arm, Patient Position: Sitting, Cuff Size: Large)   Pulse 76   Ht 5\' 9"  (1.753 m)   Wt 246 lb 12.8 oz (111.9 kg)   BMI 36.45 kg/m    General: Well developed, overweight African American male appearing in no acute distress. Head: Normocephalic, atraumatic, sclera non-icteric, no xanthomas, nares are without discharge.  Neck: No carotid bruits. JVD not elevated.  Lungs: Respirations regular and unlabored,  without wheezes or rales.  Heart: Regular rate and rhythm. No S3 or S4.  No murmur, no rubs, or gallops appreciated. Abdomen: Soft, non-tender, non-distended with normoactive bowel sounds. No hepatomegaly. No rebound/guarding. No obvious abdominal masses. Msk:  Strength and tone appear normal for age. No joint deformities or effusions. Extremities: No clubbing or cyanosis. No edema.  Distal pedal pulses are 2+ bilaterally. Neuro: Alert and oriented X 3. Moves all extremities spontaneously. No focal deficits noted. Psych:  Responds to questions appropriately with a normal affect. Skin: No rashes or lesions noted  Wt Readings from Last 3 Encounters:  08/16/16 246 lb 12.8 oz (111.9 kg)  07/09/16 246 lb (111.6 kg)  01/18/16 248 lb (112.5 kg)     Studies/Labs Reviewed:   EKG:  EKG is ordered today. The ekg ordered today demonstrates NSR, HR 77, with known LBBB. Artifact and LBBB prohibit further interpretation.   Recent Labs: 07/09/2016: ALT 13; BUN 14; Creatinine, Ser 0.91; Hemoglobin 13.8; Platelets 170.0; Potassium 4.0; Sodium 141; TSH 3.94   Lipid Panel    Component Value Date/Time   CHOL 161 07/09/2016 1013   TRIG 96.0 07/09/2016 1013   HDL 28.90 (L) 07/09/2016 1013   CHOLHDL 6 07/09/2016 1013   VLDL 19.2 07/09/2016 1013   LDLCALC 113 (H) 07/09/2016 1013   LDLDIRECT 199.5 07/25/2012 0935    Additional studies/ records that were reviewed today  include:   Cardiac Catheterization Stent Cards: Provided by Patient: 2015 with PCI to the RCA (2.13mm x25mm Resolute DES) and LCx (3.35mm x26mm Resolute DES)   Assessment:    1. Atherosclerosis of native coronary artery of native heart with unstable angina pectoris ([place])   2. Mixed hyperlipidemia   3. Essential hypertension      Plan:   In order of problems listed above:  1. CAD with Unstable Angina - has known CAD with previous stent placement in 2010 to the RCA and DES to RCA and LCx in 2015.  - has continued to experience episodes of chest pain and dyspnea with exertion for the past several months, which lasts for 5-10 minutes and is relieved with sitting down and resting or taking a SL NTG. He is using approximately 1 bottle of NTG per month.  - EKG today shows his known LBBB with no acute ischemic changes.  - with his presenting symptoms being concerning for unstable angina in the setting of known CAD, a cardiac catheterization is recommended for definitive evaluation. The patient understands that risks include but are not limited to stroke (1 in 1000), death (1 in 29), kidney failure [usually temporary] (1 in 500), bleeding (1 in 200), allergic reaction [possibly serious] (1 in 200). The assessment and plan was discussed with Anthony Chapman (DOD). Will check pre-cath labs 7-days prior to the procedure.  - continue ASA, Plavix, Lipitor, Amlodipine, and Coreg. Reports being intolerant to Imdur in the past therefore will not add this to his current regimen.   2. Mixed HLD - Lipid Panel in 07/2016 showed total cholesterol 161, HDL 28, and LDL 113. - continue Atorvastatin 80mg  daily with goal LDL < 70. If LDL remains elevated, consider Zetia.   3. Essential HTN - BP at 151/98 today. Reports systolic readings in the [id number] or less at home or when at the Pharmacy.  - continue Coreg and HCTZ.     Medication Adjustments/Labs and Tests Ordered: Current medicines are reviewed at length with  the patient today.  Concerns regarding medicines are outlined above.  Medication  changes, Labs and Tests ordered today are listed in the Patient Instructions below. Patient Instructions  Medication Instructions:  Your physician recommends that you continue on your current medications as directed. Please refer to the Current Medication list given to you today.  Labwork: Lab work to be done Architectural technologist.--BMP, CBC, PT,PTT--go to [facility] lab on the first floor for this lab work.   Testing/Procedures: Your physician has requested that you have a cardiac catheterization. Cardiac catheterization is used to diagnose and/or treat various heart conditions. Doctors may recommend this procedure for a number of different reasons. The most common reason is to evaluate chest pain. Chest pain can be a symptom of coronary artery disease (CAD), and cardiac catheterization can show whether plaque is narrowing or blocking your heart's arteries. This procedure is also used to evaluate the valves, as well as measure the blood flow and oxygen levels in different parts of your heart. For further information please visit [email or web address]. Please follow instruction sheet, as given. Scheduled for January 17,2018  Follow-Up: Your physician recommends that you schedule a follow-up appointment in: one month with Anthony Chapman or Bernerd Pho, [place].    Any Other Special Instructions Will Be Listed Below (If Applicable).   If you need a refill on your cardiac medications before your next appointment, please call your pharmacy.   Arna Medici, [place]  08/16/2016 5:41 PM    [facility] [address], [place], [place]  [address] Phone: [phone]; Fax: [phone]  [address], [place], [place] [address] Phone: [phone]

## 2016-08-27 NOTE — Interval H&P Note (Signed)
History and Physical Interval Note:  08/27/2016 12:32 PM  LORAN WAGENBLAST  has presented today for surgery, with the diagnosis of unstable/progressive angina  The various methods of treatment have been discussed with the patient and family. After consideration of risks, benefits and other options for treatment, the patient has consented to  Procedure(s): Left Heart Cath and Coronary Angiography (N/A) as a surgical intervention .  The patient's history has been reviewed, patient examined, no change in status, stable for surgery.  I have reviewed the patient's chart and labs.  Questions were answered to the patient's satisfaction.    Cath Lab Visit (complete for each Cath Lab visit)  Clinical Evaluation Leading to the Procedure:   ACS: Yes.    Non-ACS:    Anginal Classification: CCS III  Anti-ischemic medical therapy: Maximal Therapy (2 or more classes of medications)  Non-Invasive Test Results: No non-invasive testing performed  Prior CABG: No previous CABG   Glenetta Hew

## 2016-08-27 NOTE — Care Management Note (Addendum)
Case Management Note  Patient Details  Name: Anthony Chapman MRN: [id number] Date of Birth: Dec 10, 1950  Subjective/Objective:   S/p coronary stent intervention, will be on plavix, lives with wife, pta indep, has pcp, Dr. Alain Marion, he will have transportation at dc, and he has no problem getting his medicaitions, NCM will cont to follow for dc needs.                 Action/Plan:   Expected Discharge Date:                  Expected Discharge Plan:  Home/Self Care  In-House Referral:     Discharge planning Services  CM Consult  Post Acute Care Choice:    Choice offered to:     DME Arranged:    DME Agency:     HH Arranged:    HH Agency:     Status of Service:  In process, will continue to follow  If discussed at Long Length of Stay Meetings, dates discussed:    Additional Comments:  Zenon Mayo, RN 08/27/2016, 2:35 PM

## 2016-08-28 ENCOUNTER — Other Ambulatory Visit: Payer: Self-pay

## 2016-08-28 DIAGNOSIS — T82855A Stenosis of coronary artery stent, initial encounter: Secondary | ICD-10-CM | POA: Diagnosis not present

## 2016-08-28 DIAGNOSIS — I200 Unstable angina: Secondary | ICD-10-CM | POA: Diagnosis not present

## 2016-08-28 DIAGNOSIS — I10 Essential (primary) hypertension: Secondary | ICD-10-CM

## 2016-08-28 DIAGNOSIS — I25110 Atherosclerotic heart disease of native coronary artery with unstable angina pectoris: Secondary | ICD-10-CM | POA: Diagnosis not present

## 2016-08-28 DIAGNOSIS — I2582 Chronic total occlusion of coronary artery: Secondary | ICD-10-CM | POA: Diagnosis not present

## 2016-08-28 LAB — BASIC METABOLIC PANEL
Anion gap: 7 (ref 5–15)
BUN: 10 mg/dL (ref 6–20)
CO2: 26 mmol/L (ref 22–32)
Calcium: 8.6 mg/dL — ABNORMAL LOW (ref 8.9–10.3)
Chloride: 105 mmol/L (ref 101–111)
Creatinine, Ser: 0.92 mg/dL (ref 0.61–1.24)
GFR calc Af Amer: >60 mL/min (ref >60)
GFR calc non Af Amer: >60 mL/min (ref >60)
Glucose, Bld: 106 mg/dL — ABNORMAL HIGH (ref 65–99)
Potassium: 3.5 mmol/L (ref 3.5–5.1)
Sodium: 138 mmol/L (ref 135–145)

## 2016-08-28 LAB — CBC
HCT: 40.5 % (ref 39.0–52.0)
Hemoglobin: 12.8 g/dL — ABNORMAL LOW (ref 13.0–17.0)
MCH: 25.5 pg — ABNORMAL LOW (ref 26.0–34.0)
MCHC: 31.6 g/dL (ref 30.0–36.0)
MCV: 80.7 fL (ref 78.0–100.0)
Platelets: 136 10*3/uL — ABNORMAL LOW (ref 150–400)
RBC: 5.02 MIL/uL (ref 4.22–5.81)
RDW: 15.3 % (ref 11.5–15.5)
WBC: 5.7 10*3/uL (ref 4.0–10.5)

## 2016-08-28 MED ORDER — HYDRALAZINE HCL 25 MG PO TABS: 25.0000 mg | ORAL_TABLET | Freq: Three times a day (TID) | ORAL | 3 refills | Status: DC

## 2016-08-28 MED ORDER — NITROGLYCERIN 0.4 MG SL SUBL: 0.4000 mg | SUBLINGUAL_TABLET | SUBLINGUAL | 12 refills | Status: DC | PRN

## 2016-08-28 NOTE — Progress Notes (Signed)
Progress Note  Patient Name: Anthony Chapman Date of Encounter: 08/28/2016  Primary Cardiologist: Previously Darrel Reach - Now Dr. [place] with [facility]. No chest pain, sob or palpitations.   Inpatient Medications    Scheduled Meds: . amLODipine  10 mg Oral Daily  . aspirin EC  81 mg Oral QODAY  . atorvastatin  80 mg Oral Daily  . carvedilol  25 mg Oral BID WC  . cholecalciferol  1,000 Units Oral QODAY  . clopidogrel  75 mg Oral Daily  . hydrALAZINE  25 mg Oral BID  . hydrochlorothiazide  25 mg Oral Daily  . potassium chloride  8 mEq Oral Daily  . sodium chloride flush  3 mL Intravenous Q12H   Continuous Infusions:  PRN Meds: sodium chloride, acetaminophen, naphazoline-glycerin, naproxen sodium, nitroGLYCERIN, ondansetron (ZOFRAN) IV, sodium chloride flush   Vital Signs    Vitals:   08/27/16 2129 08/28/16 0100 08/28/16 0355 08/28/16 0721  BP: (!) 145/85 (!) 159/90 127/75 (!) 150/79  Pulse: 69 75 69 71  Resp: (!) 21 14 15 19   Temp:  97.7 F (36.5 C) 98 F (36.7 C) 98.1 F (36.7 C)  TempSrc:  Oral Oral Oral  SpO2: 98% 99% 98% 98%  Weight:   258 lb 14.4 oz (117.4 kg)   Height:        Intake/Output Summary (Last 24 hours) at 08/28/16 0751 Last data filed at 08/28/16 0746  Gross per 24 hour  Intake          1252.08 ml  Output             2800 ml  Net         -1547.92 ml   Filed Weights   08/27/16 0836 08/28/16 0355  Weight: 246 lb (111.6 kg) 258 lb 14.4 oz (117.4 kg)    Telemetry   NSR- Personally Reviewed  ECG    Sinus rhythm with  Chronic LBBB- Personally Reviewed  Physical Exam   GEN: No acute distress.  Neck: No JVD Cardiac: RRR, no murmurs, rubs, or gallops. R radial cath site stable.  Respiratory: Clear to auscultation bilaterally. GI: Soft, nontender, non-distended  MS: No edema; No deformity. Neuro:  AAOx3. Psych: Normal affect  Labs    Chemistry Recent Labs Lab 08/28/16 0333  NA 138  K 3.5    CL 105  CO2 26  GLUCOSE 106*  BUN 10  CREATININE 0.92  CALCIUM 8.6*  GFRNONAA >60  GFRAA >60  ANIONGAP 7     Hematology Recent Labs Lab 08/28/16 0333  WBC 5.7  RBC 5.02  HGB 12.8*  HCT 40.5  MCV 80.7  MCH 25.5*  MCHC 31.6  RDW 15.3  PLT 136*    Cardiac EnzymesNo results for input(s): TROPONINI in the last 168 hours. No results for input(s): TROPIPOC in the last 168 hours.   BNPNo results for input(s): BNP, PROBNP in the last 168 hours.   DDimer No results for input(s): DDIMER in the last 168 hours.   Radiology    No results found.  Cardiac Studies   Coronary Stent Intervention  Left Heart Cath and Coronary Angiography  Conclusion     Ost LAD to Prox LAD stent, 10 %stenosed.  Dist RCA Stent (Resolute DES 2.5 mg x 14 mm), 100 %stenosed. Fills via left to right collaterals  ____________CULPRIT______________________  Mid Cx lesion, 90 %stenosed after previous stent  A STENT RESOLUTE ONYX 3.0X18 drug eluting stent was  successfully placed, and overlaps previously placed stent.  Post intervention, there is a 0% residual stenosis.  __________________________________________  The left ventricular ejection fraction is 50-55% by visual estimate.  LV end diastolic pressure is moderately elevated.   Assessment difficult PCI of mid circumflex stent overlapping the previous he placed proximal stent. Chronically occluded distal RCA stent with brisk left left right collaterals. Patent LAD stent  Plan:  Overnight observation with anticipated discharge tomorrow morning.  TR band removal per protocol.  Continue aspirin plus Plavix, essentially lifelong.  He aggressively treating cardiac risk factors with statin and antihypertensives. (Beta Blocker and Calcium Channel Blocker as he is allergic to ace inhibitors)      Patient Profile     Anthony Chapman is a 66 y.o. male with past medical history of CAD, Type 2 DM, HTN, HLD, and known LBBB who recently  seen in clinic for chest pain presented for schedule cath 08/27/16.   Assessment & Plan    1. CAD with unstable angina - Cath showed patent LAD stent; chronically occluded distal RCA stent with brisk left left right collaterals. 90% mid Cx s/p difficult PCI of mid circumflex stent overlapping the previous he placed proximal stent. - continue ASA and plavix (likely lifelong). Continue statin, coreg and CCB. Allergic to ACE. Reports being intolerant to Imdur in the past therefore will not add this.  2. HLD - 07/09/2016: Cholesterol 161; HDL 28.90; LDL Cholesterol 113; Triglycerides 96.0; VLDL 19.2  - Continue lipitor 80mg  with goal LDL < 70. If LDL remains elevated, consider Zetia.   3. HTN - BP minimally elevated at times. Consider increasing hydralazine to TID.      Signed, Leanor Kail, PA  08/28/2016, 7:51 AM

## 2016-08-28 NOTE — Progress Notes (Signed)
Rounding done, no needs at this time. 

## 2016-08-28 NOTE — Progress Notes (Signed)
CARDIAC REHAB PHASE I   PRE:  Rate/Rhythm: 85 NST  BP:  Sitting: 163/71      SaO2: 97 ra  MODE:  Ambulation: 950 ft   POST:  Rate/Rhythm: 100 sr  BP:  Sitting: 216/113, reck 5 min rest 159/94 see note     SaO2: 98 ra  [phone] Patient ambulated in hallway independently accompanied by wife and cardiac rehab rn. Steady gait noted. Post ambulation patient extremely hypertensive and c/o mild choking sensation in throat. Dr. Rayann Heman and primary RN notified. Resolved with 5 min rest. Hypertensive medication increased to TID. Discharge education completed including antiplatelet therapy, activity progression, diet (cardiac/diabetic), and phase 2 cardiac rehab. With patient's permission order for phase 2 placed.  Elaura Calix English PayneRN, BSN 08/28/2016 9:06 AM

## 2016-08-28 NOTE — Discharge Summary (Signed)
Discharge Summary    Patient ID: Anthony Chapman,  MRN: [id number], DOB/AGE: 01-31-1951 66 y.o.  Admit date: 08/27/2016 Discharge date: 08/28/2016  Primary Care Provider: Walker Kehr Primary Cardiologist: Previously Darrel Reach - Now Dr. Martinique with [facility]   Discharge Diagnoses    Principal Problem:   Progressive angina [facility]) Active Problems:   Hyperlipidemia   Essential hypertension   CAD S/P percutaneous coronary angioplasty - 2015 PCI to RCA (2.5 x 14 Resolute DES) & Cx (3 x 12 Resolute DES)   Atherosclerosis of native coronary artery of native heart with unstable angina pectoris (HCC)   Allergies Allergies  Allergen Reactions  . Losartan Other (See Comments)    angioedema    Diagnostic Studies/Procedures    Coronary Stent Intervention  Left Heart Cath and Coronary Angiography  Conclusion     Ost LAD to Prox LAD stent, 10 %stenosed.  Dist RCA Stent (Resolute DES 2.5 mg x 14 mm), 100 %stenosed. Fills via left to right collaterals  ____________CULPRIT______________________  Mid Cx lesion, 90 %stenosed after previous stent  A STENT RESOLUTE ONYX 3.0X18 drug eluting stent was successfully placed, and overlaps previously placed stent.  Post intervention, there is a 0% residual stenosis.  __________________________________________  The left ventricular ejection fraction is 50-55% by visual estimate.  LV end diastolic pressure is moderately elevated.  Assessment difficult PCI of mid circumflex stent overlapping the previous he placed proximal stent. Chronically occluded distal RCA stent with brisk left left right collaterals. Patent LAD stent  Plan:  Overnight observation with anticipated discharge tomorrow morning.  TR band removal per protocol.  Continue aspirin plus Plavix, essentially lifelong.  He aggressively treating cardiac risk factors with statin and antihypertensives. (Beta Blocker and Calcium Channel Blocker as he is  allergic to ace inhibitors)      History of Present Illness     Anthony Rudis Lindsayis a 66 y.o.malewith past medical history of CAD, Type 2 DM, HTN, HLD, and known LBBB who recently seen in clinic for chest pain presented for schedule cath 08/27/16.   Previously followed by Dr. Beatrix Fetters with [facility] but wishes to transition his care to [facility]. He has known CAD with previous stent placement to the RCA in 2010 and last cath in 2015 with PCI to the RCA (2.31mm x48mm Resolute DES) and LCx (3.65mm x65mm Resolute DES). Underwent stress testing in 2016 which showed no evidence of ischemia.   Seen in clinic 08/16/16 for episodes of chest pain with exertion for the past several months.. He goes to the mall multiple times a week for exercise, but reports his activity is limited secondary to dyspnea with exertion. Has also experienced chest pain with walking around the grocery store. His pain usually lasts for 5-10 minutes and is relieved with sitting down and resting or taking a SL NTG. He is using approximately 1 bottle of NTG per month. Pain radiates into his jaw at times.  With his presenting symptoms being concerning for unstable angina in the setting of known CAD, a cardiac catheterization is recommended for definitive evaluation.  Hospital Course     Consultants: None    1. CAD with unstable angina -Cath showed patent LAD stent; chronically occluded distal RCA stent with brisk left left right collaterals. 90% mid Cx s/p difficult PCI of mid circumflex stent overlapping the previous he placed proximal stent. - continue ASA and plavix (likely lifelong). Continue statin, coreg and CCB. Allergic to ACE. Reports being intolerant to Imdur  in the past therefore will not add this.  2. HLD - 07/09/2016: Cholesterol 161; HDL 28.90; LDL Cholesterol 113; Triglycerides 96.0; VLDL 19.2  - Continue lipitor 80mg  with goal LDL <70. If LDL remains elevated, consider Zetia.   3. HTN - BP minimally  elevated at times. Will increase hydralazine to TID.    The patient has been seen by Dr. Rayann Heman  today and deemed ready for discharge home. All follow-up appointments have been scheduled. Discharge medications are listed below.  _____________   Discharge Vitals Blood pressure (!) 150/79, pulse 71, temperature 98.1 F (36.7 C), temperature source Oral, resp. rate 19, height 5\' 9"  (1.753 m), weight 258 lb 14.4 oz (117.4 kg), SpO2 98 %.  Filed Weights   08/27/16 0836 08/28/16 0355  Weight: 246 lb (111.6 kg) 258 lb 14.4 oz (117.4 kg)    Labs & Radiologic Studies     CBC  Recent Labs  08/28/16 0333  WBC 5.7  HGB 12.8*  HCT 40.5  MCV 80.7  PLT [id number]*   Basic Metabolic Panel  Recent Labs  08/28/16 0333  NA 138  K 3.5  CL 105  CO2 26  GLUCOSE 106*  BUN 10  CREATININE 0.92  CALCIUM 8.6*   Liver Function Tests No results for input(s): AST, ALT, ALKPHOS, BILITOT, PROT, ALBUMIN in the last 72 hours. No results for input(s): LIPASE, AMYLASE in the last 72 hours. Cardiac Enzymes No results for input(s): CKTOTAL, CKMB, CKMBINDEX, TROPONINI in the last 72 hours. BNP Invalid input(s): POCBNP D-Dimer No results for input(s): DDIMER in the last 72 hours. Hemoglobin A1C No results for input(s): HGBA1C in the last 72 hours. Fasting Lipid Panel No results for input(s): CHOL, HDL, LDLCALC, TRIG, CHOLHDL, LDLDIRECT in the last 72 hours. Thyroid Function Tests No results for input(s): TSH, T4TOTAL, T3FREE, THYROIDAB in the last 72 hours.  Invalid input(s): FREET3  No results found.  Disposition   Pt is being discharged home today in good condition.  Follow-up Plans & Appointments    Follow-up [place], [place]. Go on 09/21/2016.   Specialties:  Physician Assistant, Cardiology Why:  @ 2pm for post [employer] information: [address] [place] [address] [phone]          Discharge Instructions    Diet - low sodium  heart healthy    Complete by:  As directed    Discharge instructions    Complete by:  As directed    No driving for 48 hours. No lifting over 5 lbs for 1 week. No sexual activity for 1 week.Keep procedure site clean & dry. If you notice increased pain, swelling, bleeding or pus, call/return!  You may shower, but no soaking baths/hot tubs/pools for 1 week.   Increase activity slowly    Complete by:  As directed       Discharge Medications   Current Discharge Medication List    CONTINUE these medications which have CHANGED   Details  hydrALAZINE (APRESOLINE) 25 MG tablet Take 1 tablet (25 mg total) by mouth 3 (three) times daily. Qty: 90 tablet, Refills: 3    nitroGLYCERIN (NITROSTAT) 0.4 MG SL tablet Place 1 tablet (0.4 mg total) under the tongue every 5 (five) minutes as needed for chest pain. Qty: 25 tablet, Refills: 12      CONTINUE these medications which have NOT CHANGED   Details  amLODipine (NORVASC) 10 MG tablet Take 1 tablet (10 mg total) by mouth  daily. Qty: 90 tablet, Refills: 3    aspirin 81 MG EC tablet Take 81 mg by mouth every other day.     atorvastatin (LIPITOR) 80 MG tablet Take 1 tablet (80 mg total) by mouth daily. Overdue for yearly physical w/labs must see MD for refills Qty: 30 tablet, Refills: 0    carvedilol (COREG) 25 MG tablet Take 1 tablet (25 mg total) by mouth 2 (two) times daily with a meal. Qty: 180 tablet, Refills: 3    cholecalciferol (VITAMIN D) 1000 units tablet Take 1,000 Units by mouth every other day.    clopidogrel (PLAVIX) 75 MG tablet Take 75 mg by mouth daily.    hydrochlorothiazide (HYDRODIURIL) 25 MG tablet Take 1 tablet (25 mg total) by mouth daily. Qty: 60 tablet, Refills: 0    potassium chloride (KLOR-CON) 8 MEQ tablet Take 1 tablet (8 mEq total) by mouth daily. Qty: 90 tablet, Refills: 3    tetrahydrozoline 0.05 % ophthalmic solution Place 1-2 drops into both eyes 3 (three) times daily as needed (for dry/irritated eyes).       STOP taking these medications     ibuprofen (ADVIL,MOTRIN) 600 MG tablet      naproxen sodium (ANAPROX) 220 MG tablet           Outstanding Labs/Studies   None  Duration of Discharge Encounter   Greater than 30 minutes including physician time.  Signed, Bhagat,Bhavinkumar PA-C 08/28/2016, 8:37 AM  I have seen, examined the patient, and reviewed the above assessment and plan.  Changes to above are made where necessary.  Pt noted to be hypertensive with ambulation.  Will increase hydralazine to TID.  The importance of cardiac rehab stressed with the patient today.  Will need close outpatient follow-up and further medicine titration as an outpatient.   Co Sign: [name], MD 08/28/2016 8:56 AM

## 2016-09-13 ENCOUNTER — Ambulatory Visit (INDEPENDENT_AMBULATORY_CARE_PROVIDER_SITE_OTHER): Payer: 59 | Admitting: Pulmonary Disease

## 2016-09-13 ENCOUNTER — Encounter: Payer: Self-pay | Admitting: Pulmonary Disease

## 2016-09-13 DIAGNOSIS — G4710 Hypersomnia, unspecified: Secondary | ICD-10-CM | POA: Insufficient documentation

## 2016-09-13 NOTE — Assessment & Plan Note (Signed)
Patient has snoring, witnessed apneas, occasional gasping or choking and awakenings at night. Sleeps 5 hrs per night. Has unrefreshed sleep in am. (-) abnormal behaviro in sleep.   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   Occasional nap in pm.   ESS 13.   Plan :  We discussed about the diagnosis of Obstructive Sleep Apnea (OSA) and implications of untreated OSA. We discussed about CPAP and BiPaP as possible treatment options.    We will schedule the patient for a sleep study. Plan for a HST. I wanted to do a lab study but he wanted to hold off. Wife has sleep apnea and she uses CPAP. Anticipate no issues with CPAP. Likely, although CPAP 5-15 centimeters water. He drives a delivery truck.   Patient was instructed to call the office if he/she has not heard back from the office 1-2 weeks after the sleep study.   Patient was instructed to call the office if he/she is having issues with the PAP device.   We discussed good sleep hygiene.   Patient was advised not to engage in activities requiring concentration and/or vigilance if he/she is sleepy.  Patient was advised not to drive if he/she is sleepy.

## 2016-09-13 NOTE — Patient Instructions (Signed)

## 2016-09-13 NOTE — Progress Notes (Signed)
Subjective:    Patient ID: Anthony Chapman, male    DOB: April 01, 1951, 66 y.o.   MRN: [id number]  HPI   This is the case of Anthony Chapman, 66 y.o. Male, who was referred by Dr. Lew Dawes in consultation regarding possible OSA.   As you very well know, patient has a 35PY smoking history, quit in his 66s, not been diagnosed with asthma or copd.   Patient has snoring, witnessed apneas, occasional gasping or choking and awakenings at night. Sleeps 5 hrs per night. Has unrefreshed sleep in am. (-) abnormal behaviro in sleep.   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   Occasional nap in pm.    ESS 13.      Review of Systems  Constitutional: Negative.  Negative for fever and unexpected weight change.  HENT: Negative.  Negative for congestion, dental problem, ear pain, nosebleeds, postnasal drip, rhinorrhea, sinus pressure, sneezing, sore throat and trouble swallowing.   Eyes: Negative.  Negative for redness and itching.  Respiratory: Positive for shortness of breath. Negative for cough, chest tightness and wheezing.   Cardiovascular: Negative.  Negative for palpitations and leg swelling.  Gastrointestinal: Negative.  Negative for nausea and vomiting.  Endocrine: Negative.   Genitourinary: Negative.  Negative for dysuria.  Musculoskeletal: Negative.  Negative for joint swelling.  Skin: Negative.  Negative for rash.  Allergic/Immunologic: Negative.   Neurological: Negative.  Negative for headaches.  Hematological: Bruises/bleeds easily.  Psychiatric/Behavioral: Negative.  Negative for dysphoric mood. The patient is not nervous/anxious.    Past Medical History:  Diagnosis Date  . Anginal pain ([place])   . Arthritis    "all over" (08/27/2016)  . CAD (coronary artery disease)    a.  stent placement to the RCA in 2010 b. cath in 2015 with PCI to the RCA (2.4mm x40mm Resolute DES) and LCx (3.42mm x34mm Resolute  DES).   . ED (erectile dysfunction)   . GERD (gastroesophageal reflux disease)   . HTN (hypertension)   . Hyperlipemia   . Myocardial infarction 2010; 2015   (-) CA, DVT  Family History  Problem Relation Age of Onset  . Hypertension Mother   . CAD Mother   . Hypertension Father   . Arthritis Father   . Hypertension    . CAD Maternal Aunt      Past Surgical History:  Procedure Laterality Date  . CARDIAC CATHETERIZATION  09/2013  . CARDIAC CATHETERIZATION N/A 08/27/2016   Procedure: Left Heart Cath and Coronary Angiography;  Surgeon: Leonie Man, MD;  Location: [place] [facility];  Service: Cardiovascular;  Laterality: N/A;  . CARDIAC CATHETERIZATION N/A 08/27/2016   Procedure: Coronary Stent Intervention;  Surgeon: Leonie Man, MD;  Location: [place] [facility];  Service: Cardiovascular;  Laterality: N/A;  . CORONARY ANGIOPLASTY WITH STENT PLACEMENT  2010; 1/192018  . HERNIA REPAIR    . INGUINAL HERNIA REPAIR Left   . UMBILICAL HERNIA REPAIR      Social History   Social History  . Marital status: Married    Spouse name: N/A  . Number of children: N/A  . 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 Smoking status: Former Smoker    Packs/day: 1.00    Years: 30.00    Types: Cigarettes    Quit date: 08/16/1998  . Smokeless  tobacco: Never Used  . Alcohol use No  . Drug use: No  . Sexual activity: Yes   Other Topics Concern  . Not on file   Social History Narrative   Occupation: 2nd shift   Married.         Lives in [place].   Allergies  Allergen Reactions  . Losartan Other (See Comments)    angioedema     Outpatient Medications Prior to Visit  Medication Sig Dispense Refill  . amLODipine (NORVASC) 10 MG tablet Take 1 tablet (10 mg total) by mouth daily. 90 tablet 3  . aspirin 81 MG EC tablet Take 81 mg by mouth every other day.     Marland Kitchen atorvastatin (LIPITOR) 80 MG tablet Take 1 tablet (80 mg total)  by mouth daily. Overdue for yearly physical w/labs must see MD for refills (Patient taking differently: Take 80 mg by mouth every evening. Overdue for yearly physical w/labs must see MD for refills) 30 tablet 0  . carvedilol (COREG) 25 MG tablet Take 1 tablet (25 mg total) by mouth 2 (two) times daily with a meal. 180 tablet 3  . cholecalciferol (VITAMIN D) 1000 units tablet Take 1,000 Units by mouth every other day.    . clopidogrel (PLAVIX) 75 MG tablet Take 75 mg by mouth daily.    . hydrALAZINE (APRESOLINE) 25 MG tablet Take 1 tablet (25 mg total) by mouth 3 (three) times daily. 90 tablet 3  . hydrochlorothiazide (HYDRODIURIL) 25 MG tablet Take 1 tablet (25 mg total) by mouth daily. 60 tablet 0  . nitroGLYCERIN (NITROSTAT) 0.4 MG SL tablet Place 1 tablet (0.4 mg total) under the tongue every 5 (five) minutes as needed for chest pain. 25 tablet 12  . potassium chloride (KLOR-CON) 8 MEQ tablet Take 1 tablet (8 mEq total) by mouth daily. 90 tablet 3  . tetrahydrozoline 0.05 % ophthalmic solution Place 1-2 drops into both eyes 3 (three) times daily as needed (for dry/irritated eyes).     No facility-administered medications prior to visit.    No orders of the defined types were placed in this encounter.          Objective:   Physical Exam  Vitals:  Vitals:   09/13/16 1135  BP: 116/72  Pulse: 74  SpO2: 94%  Weight: 246 lb (111.6 kg)  Height: 5\' 9"  (1.753 m)    Constitutional/General:  Pleasant, well-nourished, well-developed, not in any distress,  Comfortably seating.  Well kempt  Body mass index is 36.33 kg/m. Wt Readings from Last 3 Encounters:  09/13/16 246 lb (111.6 kg)  08/28/16 258 lb 14.4 oz (117.4 kg)  08/16/16 246 lb 12.8 oz (111.9 kg)      HEENT: Pupils equal and reactive to light and accommodation. Anicteric sclerae. Normal nasal mucosa.   No oral  lesions,  mouth clear,  oropharynx clear, no postnasal drip. (-) Oral thrush. No dental caries.  Airway -  Mallampati class III  Neck: No masses. Midline trachea. No JVD, (-) LAD. (-) bruits appreciated.  Respiratory/Chest: Grossly normal chest. (-) deformity. (-) Accessory muscle use.  Symmetric expansion. (-) Tenderness on palpation.  Resonant on percussion.  Diminished BS on both lower lung zones. (-) wheezing, crackles, rhonchi (-) egophony  Cardiovascular: Regular rate and  rhythm, heart sounds normal, no murmur or gallops, no peripheral edema  Gastrointestinal:  Normal bowel sounds. Soft, non-tender. No hepatosplenomegaly.  (-) masses.   Musculoskeletal:  Normal muscle tone. Normal gait.   Extremities: Grossly normal. (-)  clubbing, cyanosis.  (-) edema  Skin: (-) rash,lesions seen.   Neurological/Psychiatric : alert, oriented to time, place, person. Normal mood and affect          Assessment & Plan:  Hypersomnia Patient has snoring, witnessed apneas, occasional gasping or choking and awakenings at night. Sleeps 5 hrs per night. Has unrefreshed sleep in am. (-) abnormal behaviro in sleep.   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   Occasional nap in pm.   ESS 13.   Plan :  We discussed about the diagnosis of Obstructive Sleep Apnea (OSA) and implications of untreated OSA. We discussed about CPAP and BiPaP as possible treatment options.    We will schedule the patient for a sleep study. Plan for a HST. I wanted to do a lab study but he wanted to hold off. Wife has sleep apnea and she uses CPAP. Anticipate no issues with CPAP. Likely, although CPAP 5-15 centimeters water. He drives a delivery truck.   Patient was instructed to call the office if he/she has not heard back from the office 1-2 weeks after the sleep study.   Patient was instructed to call the office if he/she is having issues with the PAP device.   We discussed good sleep hygiene.   Patient was advised not to engage in activities  requiring concentration and/or vigilance if he/she is sleepy.  Patient was advised not to drive if he/she is sleepy.       Thank you very much for letting me participate in this patient's care. Please do not hesitate to give me a call if you have any questions or concerns regarding the treatment plan.   Patient will follow up with me in 8-10 weeks.     Monica Becton, MD 09/13/2016   12:04 PM Pulmonary and [place] Pager: [phone] Office: [phone], Fax: [phone]

## 2016-09-17 ENCOUNTER — Telehealth (HOSPITAL_COMMUNITY): Payer: Self-pay | Admitting: Internal Medicine

## 2016-09-17 NOTE — Telephone Encounter (Signed)
S/w [facility] verifying [facility] coverage for Cardiac Rehab Program. No Deductible,  Out of Pocket $2250.00 pt has met all, pt has no responsibility of payment. 24 sessions  Pt has Medicare A/B primary then [facility] secondary .... Reference # [id number].... KJ

## 2016-09-20 NOTE — Progress Notes (Signed)
Cardiology Office Note    Date:  09/21/2016   ID:  Anthony Chapman, DOB Jul 10, 1951, MRN [id number]  PCP:  Walker Kehr, MD  Cardiologist: Dr. Martinique  Chief Complaint  Patient presents with  . Follow-up    Recent cardiac catheterization    History of Present Illness:    Anthony Chapman is a 66 y.o. male with past medical history of CAD (s/p DES to RCA in 2010, DES to RCA and LCx in 2015), HTN, HLD, Type 2 DM and known LBBB who presents to the office today for follow-up from his recent cardiac catheterization.   Was seen as a new patient on 08/16/2016, reporting worsening chest discomfort and dyspnea with exertion for the past several months and using 1 bottle of NTG per month. A cardiac catheterization was recommended for definitive evaluation and this was performed on 08/27/2016 and showed 100% ISR of distal RCA with left to right collaterals, 10% Ost LAD in stent restenosis, and 90% mid-Cx stenosis with a Resolute 3.0x70mm DES being placed. Was continued on ASA, Plavix, BB, Lipitor, and Amlodipine.   In talking with the patient today, he reports doing well since his recent cardiac catheterization. He has resumed exercising daily, walking around a local mall several times per day. He initially experienced some chest discomfort with this following his cath, but reports the symptoms have since resolved. He has only had to take NTG twice since his catheterization. He is scheduled to start cardiac rehabilitation next week.  He reports BP is well-controlled at home, with SBP readings in the 130's. His cholesterol is followed by his PCP and last Lipid Panel in 07/2016 showed total cholesterol 161, triglycerides 96, and LDL 113. Has been on Atorvastatin 80mg  daily for over 2 years.    Past Medical History:  Diagnosis Date  . Anginal pain ([place])   . Arthritis    "all over" (08/27/2016)  . CAD (coronary artery disease)    a.  stent placement to the RCA in 2010 b. cath in 2015 with PCI to the RCA  (2.50mm x90mm Resolute DES) and LCx (3.92mm x10mm Resolute DES).  c. 08/2016: cath showing 100% ISR of d-RCA with collaterals, 10% Ost LAD ISR, 90% mid-Cx stenosis (3.0x10mm Resolute DES placed)  . ED (erectile dysfunction)   . GERD (gastroesophageal reflux disease)   . HTN (hypertension)   . Hyperlipemia   . Myocardial infarction 2010; 2015    Past Surgical History:  Procedure Laterality Date  . CARDIAC CATHETERIZATION  09/2013  . CARDIAC CATHETERIZATION N/A 08/27/2016   Procedure: Left Heart Cath and Coronary Angiography;  Surgeon: Leonie Man, MD;  Location: [facility];  Service: Cardiovascular;  Laterality: N/A;  . CARDIAC CATHETERIZATION N/A 08/27/2016   Procedure: Coronary Stent Intervention;  Surgeon: Leonie Man, MD;  Location: [facility];  Service: Cardiovascular;  Laterality: N/A;  . CORONARY ANGIOPLASTY WITH STENT PLACEMENT  2010; 1/192018  . HERNIA REPAIR    . INGUINAL HERNIA REPAIR Left   . UMBILICAL HERNIA REPAIR      Current Medications: Outpatient Medications Prior to Visit  Medication Sig Dispense Refill  . amLODipine (NORVASC) 10 MG tablet Take 1 tablet (10 mg total) by mouth daily. 90 tablet 3  . aspirin 81 MG EC tablet Take 81 mg by mouth every other day.     Marland Kitchen atorvastatin (LIPITOR) 80 MG tablet Take 1 tablet (80 mg total) by mouth daily. Overdue for yearly physical w/labs must see MD  for refills (Patient taking differently: Take 80 mg by mouth every evening. Overdue for yearly physical w/labs must see MD for refills) 30 tablet 0  . carvedilol (COREG) 25 MG tablet Take 1 tablet (25 mg total) by mouth 2 (two) times daily with a meal. 180 tablet 3  . cholecalciferol (VITAMIN D) 1000 units tablet Take 1,000 Units by mouth every other day.    . clopidogrel (PLAVIX) 75 MG tablet Take 75 mg by mouth daily.    . nitroGLYCERIN (NITROSTAT) 0.4 MG SL tablet Place 1 tablet (0.4 mg total) under the tongue every 5 (five) minutes as needed for chest pain. 25  tablet 12  . potassium chloride (KLOR-CON) 8 MEQ tablet Take 1 tablet (8 mEq total) by mouth daily. 90 tablet 3  . tetrahydrozoline 0.05 % ophthalmic solution Place 1-2 drops into both eyes 3 (three) times daily as needed (for dry/irritated eyes).    . hydrALAZINE (APRESOLINE) 25 MG tablet Take 1 tablet (25 mg total) by mouth 3 (three) times daily. 90 tablet 3  . hydrochlorothiazide (HYDRODIURIL) 25 MG tablet Take 1 tablet (25 mg total) by mouth daily. 60 tablet 0   No facility-administered medications prior to visit.      Allergies:   Losartan   Social History   Social History  . Marital status: Married    Spouse name: N/A  . Number of children: N/A  . 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 Smoking status: Former Smoker    Packs/day: 1.00    Years: 30.00    Types: Cigarettes    Quit date: 08/16/1998  . Smokeless tobacco: Never Used  . Alcohol use No  . Drug use: No  . Sexual activity: Yes   Other Topics Concern  . None   Social History Narrative   Occupation: 2nd shift   Married.           Family History:  The patient's family history includes Arthritis in his father; CAD in his maternal aunt and mother; Hypertension in his father and mother.   Review of Systems:   Please see the history of present illness.     General:  No chills, fever, night sweats or weight changes.  Cardiovascular:  No dyspnea on exertion, edema, orthopnea, palpitations, paroxysmal nocturnal dyspnea. Positive for chest pain.  Dermatological: No rash, lesions/masses Respiratory: No cough, dyspnea Urologic: No hematuria, dysuria Abdominal:   No nausea, vomiting, diarrhea, bright red blood per rectum, melena, or hematemesis Neurologic:  No visual changes, wkns, changes in mental status. All other systems reviewed and are otherwise negative except as noted above.   Physical Exam:    VS:  BP 140/86   Pulse 78   Ht 5\' 9"  (1.753 m)   Wt  249 lb (112.9 kg)   BMI 36.77 kg/m    General: Well developed, well nourished, African American male appearing in no acute distress. Head: Normocephalic, atraumatic, sclera non-icteric, no xanthomas, nares are without discharge.  Neck: No carotid bruits. JVD not elevated.  Lungs: Respirations regular and unlabored, without wheezes or rales.  Heart: Regular rate and rhythm. No S3 or S4.  No murmur, no rubs, or gallops appreciated. Abdomen: Soft, non-tender, non-distended with normoactive bowel sounds. No hepatomegaly. No rebound/guarding. No obvious abdominal masses. Msk:  Strength and tone appear normal for age. No joint deformities or effusions. Extremities: No clubbing or cyanosis. No edema.  Distal pedal pulses are  2+ bilaterally. Right radial cath site without ecchymosis or evidence of a hematoma.  Neuro: Alert and oriented X 3. Moves all extremities spontaneously. No focal deficits noted. Psych:  Responds to questions appropriately with a normal affect. Skin: No rashes or lesions noted  Wt Readings from Last 3 Encounters:  09/21/16 249 lb (112.9 kg)  09/13/16 246 lb (111.6 kg)  08/28/16 258 lb 14.4 oz (117.4 kg)     Studies/Labs Reviewed:   EKG:  EKG is ordered today.  The ekg ordered today demonstrates NSR, HR 78, with known LBBB.   Recent Labs: 07/09/2016: ALT 13; TSH 3.94 08/28/2016: BUN 10; Creatinine, Ser 0.92; Hemoglobin 12.8; Platelets 136; Potassium 3.5; Sodium 138   Lipid Panel    Component Value Date/Time   CHOL 161 07/09/2016 1013   TRIG 96.0 07/09/2016 1013   HDL 28.90 (L) 07/09/2016 1013   CHOLHDL 6 07/09/2016 1013   VLDL 19.2 07/09/2016 1013   LDLCALC 113 (H) 07/09/2016 1013   LDLDIRECT 199.5 07/25/2012 0935    Additional studies/ records that were reviewed today include:   Cardiac Catheterization: 08/27/2016  Ost LAD to Prox LAD stent, 10 %stenosed.  Dist RCA Stent (Resolute DES 2.5 mg x 14 mm), 100 %stenosed. Fills via left to right  collaterals  ____________CULPRIT______________________  Mid Cx lesion, 90 %stenosed after previous stent  A STENT RESOLUTE ONYX 3.0X18 drug eluting stent was successfully placed, and overlaps previously placed stent.  Post intervention, there is a 0% residual stenosis.  __________________________________________  The left ventricular ejection fraction is 50-55% by visual estimate.  LV end diastolic pressure is moderately elevated.   Assessment difficult PCI of mid circumflex stent overlapping the previous he placed proximal stent. Chronically occluded distal RCA stent with brisk left left right collaterals. Patent LAD stent  Plan:  Overnight observation with anticipated discharge tomorrow morning.  TR band removal per protocol.  Continue aspirin plus Plavix, essentially lifelong.  He aggressively treating cardiac risk factors with statin and antihypertensives. (Beta Blocker and Calcium Channel Blocker as he is allergic to ace inhibitors)  Assessment:    1. Atherosclerosis of native coronary artery of native heart without angina pectoris   2. CAD S/P percutaneous coronary angioplasty - 2015 PCI to RCA (2.5 x 14 Resolute DES) & Cx (3 x 12 Resolute DES). Resolute DES to mid-Cx in 08/2016   3. Essential hypertension   4. Hyperlipidemia LDL goal <70      Plan:   In order of problems listed above:  1. CAD - s/p DES to RCA in 2010, DES to RCA and LCx in 2015. Reported symptoms consistent with UA at his office visit in 08/2016 with a cardiac cath being recommended. This showed 100% ISR of distal RCA with left to right collaterals, 10% Ost LAD in stent restenosis, and 90% mid-Cx stenosis with a Resolute 3.0x69mm DES being placed.  - reports anginal symptoms have significantly improved since PCI. Reviewed catheterization results thoroughly with the patient. Scheduled to start cardiac rehabilitation next week.  - continue ASA, Plavix, BB, Lipitor, and Amlodipine. Will initiate  Zetia as below.   2. Essential HTN - BP at 140/86 during today's visit (at 138/82 on recheck).  - continue Amlodipine 10mg  daily, Coreg 25mg  BID, and Hydralazine 25mg  TID. Was previously on HCTZ and unsure why this was discontinued. If SBP remains > 140 during cardiac rehab, would recommend further titration of Hydralazine or restarting his HCTZ.   3. HLD - Lipid Panel in 07/2016 showed total cholesterol 161,  triglycerides 96, and LDL 113. - with LDL goal < 70, will continue Atorvastatin 80mg  daily (has been on this dose for over 2 years) and start Zetia 10mg  daily. Recheck FLP in 6-8 weeks. If still not at goal, would recommend referral to [facility] for consideration of a PCSK9 inhibitor.      Medication Adjustments/Labs and Tests Ordered: Current medicines are reviewed at length with the patient today.  Concerns regarding medicines are outlined above.  Medication changes, Labs and Tests ordered today are listed in the Patient Instructions below. Patient Instructions  Medication Instructions:  START Zetia 10mg  Take 1 tablet once a day   Labwork: Your physician recommends that you return for lab work in: 8 weeks fasting Lipid  Testing/Procedures: None   Follow-Up: Your physician recommends that you schedule a follow-up appointment in: 2-3 Months with Dr. Martinique  Any Other Special Instructions Will Be Listed Below (If Applicable).  RX for Hydralazine has been sent to your pharmacy  If you need a refill on your cardiac medications before your next appointment, please call your pharmacy.   Arna Medici, PA  09/21/2016 4:06 PM    [facility] [address], [place], [place]  [address] Phone: [phone]; Fax: [phone]  [address], [place],  [address] Phone: [phone]

## 2016-09-21 ENCOUNTER — Ambulatory Visit (INDEPENDENT_AMBULATORY_CARE_PROVIDER_SITE_OTHER): Payer: 59 | Admitting: Student

## 2016-09-21 ENCOUNTER — Encounter: Payer: Self-pay | Admitting: Student

## 2016-09-21 VITALS — BP 140/86 | HR 78 | Ht 69.0 in | Wt 249.0 lb

## 2016-09-21 DIAGNOSIS — E785 Hyperlipidemia, unspecified: Secondary | ICD-10-CM

## 2016-09-21 DIAGNOSIS — I10 Essential (primary) hypertension: Secondary | ICD-10-CM

## 2016-09-21 DIAGNOSIS — Z9861 Coronary angioplasty status: Secondary | ICD-10-CM | POA: Diagnosis not present

## 2016-09-21 DIAGNOSIS — I2510 Atherosclerotic heart disease of native coronary artery without angina pectoris: Secondary | ICD-10-CM

## 2016-09-21 MED ORDER — EZETIMIBE 10 MG PO TABS: 10.0000 mg | ORAL_TABLET | Freq: Every day | ORAL | 3 refills | Status: DC

## 2016-09-21 MED ORDER — HYDRALAZINE HCL 25 MG PO TABS: 25.0000 mg | ORAL_TABLET | Freq: Three times a day (TID) | ORAL | 3 refills | Status: DC

## 2016-09-21 NOTE — Patient Instructions (Addendum)
Medication Instructions:  START Zetia 10mg  Take 1 tablet once a day   Labwork: Your physician recommends that you return for lab work in: 8 weeks fasting Lipid  Testing/Procedures: None   Follow-Up: Your physician recommends that you schedule a follow-up appointment in: 2-3 Months with Dr. Martinique   Any Other Special Instructions Will Be Listed Below (If Applicable).  RX for Hydralazine has been sent to your pharmacy  If you need a refill on your cardiac medications before your next appointment, please call your pharmacy.

## 2016-09-22 DIAGNOSIS — G4733 Obstructive sleep apnea (adult) (pediatric): Secondary | ICD-10-CM | POA: Diagnosis not present

## 2016-09-23 ENCOUNTER — Encounter (HOSPITAL_COMMUNITY)
Admission: RE | Admit: 2016-09-23 | Discharge: 2016-09-23 | Disposition: A | Discharge status: Home / Self Care | Payer: 59 | Source: Ambulatory Visit | Attending: Cardiology | Admitting: Cardiology

## 2016-09-23 ENCOUNTER — Encounter (HOSPITAL_COMMUNITY): Payer: Self-pay

## 2016-09-23 VITALS — BP 138/84 | HR 78 | Ht 67.25 in | Wt 249.3 lb

## 2016-09-23 DIAGNOSIS — E785 Hyperlipidemia, unspecified: Secondary | ICD-10-CM | POA: Insufficient documentation

## 2016-09-23 DIAGNOSIS — I10 Essential (primary) hypertension: Secondary | ICD-10-CM | POA: Insufficient documentation

## 2016-09-23 DIAGNOSIS — I2510 Atherosclerotic heart disease of native coronary artery without angina pectoris: Secondary | ICD-10-CM | POA: Insufficient documentation

## 2016-09-23 DIAGNOSIS — I252 Old myocardial infarction: Secondary | ICD-10-CM | POA: Diagnosis not present

## 2016-09-23 DIAGNOSIS — K219 Gastro-esophageal reflux disease without esophagitis: Secondary | ICD-10-CM | POA: Insufficient documentation

## 2016-09-23 DIAGNOSIS — Z87891 Personal history of nicotine dependence: Secondary | ICD-10-CM | POA: Insufficient documentation

## 2016-09-23 DIAGNOSIS — N529 Male erectile dysfunction, unspecified: Secondary | ICD-10-CM | POA: Insufficient documentation

## 2016-09-23 DIAGNOSIS — Z955 Presence of coronary angioplasty implant and graft: Secondary | ICD-10-CM | POA: Insufficient documentation

## 2016-09-23 DIAGNOSIS — R079 Chest pain, unspecified: Secondary | ICD-10-CM | POA: Diagnosis not present

## 2016-09-23 DIAGNOSIS — M1990 Unspecified osteoarthritis, unspecified site: Secondary | ICD-10-CM | POA: Diagnosis not present

## 2016-09-23 NOTE — Progress Notes (Signed)
Cardiac Rehab Medication Review by a Pharmacist  Does the patient  feel that his/her medications are working for him/her?  yes  Has the patient been experiencing any side effects to the medications prescribed?  no  Does the patient measure his/her own blood pressure or blood glucose at home?  yes - most values are SBP 140-145, DBP ~90s  Does the patient have any problems obtaining medications due to transportation or finances?   no  Understanding of regimen: good Understanding of indications: good Potential of compliance: good    Pharmacist comments: Anthony Chapman is a 74 yoM presenting in good spirits to cardiac rehab today without assistance. We discussed the indication and importance of each of his cardiac medications and his BP goal. Overall, he stated understanding and approval of his medication regimen.   [name], PharmD PGY1 Pharmacy Resident [phone] (Pager) 09/23/2016 9:18 AM

## 2016-09-23 NOTE — Progress Notes (Signed)
Cardiac Individual Treatment Plan  Patient Details  Name: Anthony Chapman MRN: [id number] Date of Birth: 1951-07-04 Referring Provider:   Flowsheet Row CARDIAC REHAB PHASE II ORIENTATION from 09/23/2016 in [place]  Referring Provider  Martinique, Peter MD      Initial Encounter Date:  [place] PHASE II ORIENTATION from 09/23/2016 in [place]  Date  09/23/16  Referring Provider  Martinique, Peter MD      Visit Diagnosis: 08/27/16 Status post coronary artery stent placement  Patient's Home Medications on Admission:  Current Outpatient Prescriptions:  .  amLODipine (NORVASC) 10 MG tablet, Take 1 tablet (10 mg total) by mouth daily., Disp: 90 tablet, Rfl: 3 .  aspirin 81 MG EC tablet, Take 81 mg by mouth every other day. , Disp: , Rfl:  .  atorvastatin (LIPITOR) 80 MG tablet, Take 1 tablet (80 mg total) by mouth daily. Overdue for yearly physical w/labs must see MD for refills (Patient taking differently: Take 80 mg by mouth every evening. Overdue for yearly physical w/labs must see MD for refills), Disp: 30 tablet, Rfl: 0 .  carvedilol (COREG) 25 MG tablet, Take 1 tablet (25 mg total) by mouth 2 (two) times daily with a meal., Disp: 180 tablet, Rfl: 3 .  cholecalciferol (VITAMIN D) 1000 units tablet, Take 1,000 Units by mouth every other day., Disp: , Rfl:  .  clopidogrel (PLAVIX) 75 MG tablet, Take 75 mg by mouth daily., Disp: , Rfl:  .  ezetimibe (ZETIA) 10 MG tablet, Take 1 tablet (10 mg total) by mouth daily., Disp: 30 tablet, Rfl: 3 .  hydrALAZINE (APRESOLINE) 25 MG tablet, Take 1 tablet (25 mg total) by mouth 3 (three) times daily., Disp: 90 tablet, Rfl: 3 .  nitroGLYCERIN (NITROSTAT) 0.4 MG SL tablet, Place 1 tablet (0.4 mg total) under the tongue every 5 (five) minutes as needed for chest pain., Disp: 25 tablet, Rfl: 12 .  potassium chloride (KLOR-CON) 8 MEQ tablet, Take 1 tablet (8 mEq total) by mouth  daily., Disp: 90 tablet, Rfl: 3 .  tetrahydrozoline 0.05 % ophthalmic solution, Place 1-2 drops into both eyes 3 (three) times daily as needed (for dry/irritated eyes)., Disp: , Rfl:   Past Medical History: Past Medical History:  Diagnosis Date  . Anginal pain ([place])   . Arthritis    "all over" (08/27/2016)  . CAD (coronary artery disease)    a.  stent placement to the RCA in 2010 b. cath in 2015 with PCI to the RCA (2.88mm x22mm Resolute DES) and LCx (3.42mm x33mm Resolute DES).  c. 08/2016: cath showing 100% ISR of d-RCA with collaterals, 10% Ost LAD ISR, 90% mid-Cx stenosis (3.0x74mm Resolute DES placed)  . ED (erectile dysfunction)   . GERD (gastroesophageal reflux disease)   . HTN (hypertension)   . Hyperlipemia   . Myocardial infarction 2010; 2015    Tobacco Use: History  Smoking Status  . Former Smoker  . Packs/day: 1.00  . Years: 30.00  . Types: Cigarettes  . Quit date: 08/16/1998  Smokeless Tobacco  . Never Used    Labs: Recent Review Flowsheet Data    Labs for ITP Cardiac and Pulmonary Rehab Latest Ref Rng & Units 08/12/2010 08/23/2011 07/25/2012 03/17/2015 07/09/2016   Cholestrol 0 - 200 mg/dL 272(H) - 258(H) 171 161   LDLCALC 0 - 99 mg/dL - - - 113(H) 113(H)   LDLDIRECT mg/dL 224.8 - 199.5 - -   HDL >  39.00 mg/dL 34.90(L) - 33.30(L) 27.70(L) 28.90(L)   Trlycerides 0.0 - 149.0 mg/dL 129.0 - 162.0(H) 150.0(H) 96.0   Hemoglobin A1c 4.6 - 6.5 % - 6.1 6.4 - 6.2      Capillary Blood Glucose: No results found for: GLUCAP   Exercise Target Goals: Date: 09/23/16  Exercise Program Goal: Individual exercise prescription set with THRR, safety & activity barriers. Participant demonstrates ability to understand and report RPE using BORG scale, to self-measure pulse accurately, and to acknowledge the importance of the exercise prescription.  Exercise Prescription Goal: Starting with aerobic activity 30 plus minutes a day, 3 days per week for initial exercise prescription. Provide  home exercise prescription and guidelines that participant acknowledges understanding prior to discharge.  Activity Barriers & Risk Stratification:     Activity Barriers & Cardiac Risk Stratification - 09/23/16 0839      Activity Barriers & Cardiac Risk Stratification   Activity Barriers None   Cardiac Risk Stratification Moderate      6 Minute Walk:     6 Minute Walk    Row Name 09/23/16 1214         6 Minute Walk   Phase Initial     Distance 1659 feet     Walk Time 6 minutes     # of Rest Breaks 0     MPH 3.1     METS 3.7     RPE 11     VO2 Peak 12.9     Symptoms No     Resting HR 78 bpm     Resting BP 138/84     Max Ex. HR 113 bpm     Max Ex. BP 180/108     2 Minute Post BP 110/70        Initial Exercise Prescription:     Initial Exercise Prescription - 09/23/16 1200      Date of Initial Exercise RX and Referring Provider   Date 09/23/16   Referring Provider Martinique, Peter MD     Bike   Level 0.8   Minutes 10   METs 2.35     NuStep   Level 2   Minutes 10   METs 1.9     Track   Laps 10   Minutes 10   METs 2.74     Prescription Details   Frequency (times per week) 5   Duration Progress to 45 minutes of aerobic exercise without signs/symptoms of physical distress     Intensity   THRR REST +  30   THRR 40-80% of Max Heartrate 62-124   Ratings of Perceived Exertion 11-13     Progression   Progression Continue to progress workloads to maintain intensity without signs/symptoms of physical distress.     Resistance Training   Training Prescription Yes   Weight 3   Reps 10-12      Perform Capillary Blood Glucose checks as needed.  Exercise Prescription Changes:   Exercise Comments:   Discharge Exercise Prescription (Final Exercise Prescription Changes):   Nutrition:  Target Goals: Understanding of nutrition guidelines, daily intake of sodium 1500mg , cholesterol 200mg , calories 30% from fat and 7% or less from saturated fats,  daily to have 5 or more servings of fruits and vegetables.  Biometrics:     Pre Biometrics - 09/23/16 1404      Pre Biometrics   Waist Circumference 47 inches   Hip Circumference 48.5 inches   Waist to Hip Ratio 0.97 %   Triceps Skinfold 27  mm   % Body Fat 37.5 %   Grip Strength 50 kg   Flexibility 8 in   Single Leg Stand 30 seconds       Nutrition Therapy Plan and Nutrition Goals:     Nutrition Therapy & Goals - 09/23/16 1357      Nutrition Therapy   Diet Carb Modified, Therapeutic Lifestyle Changes     Personal Nutrition Goals   Personal Goal #1 Wt loss of 1-2 lb/week to a wt loss goal of 6-24 lb at graduation from rehab     [place], educate and counsel regarding individualized specific dietary modifications aiming towards targeted core components such as weight, hypertension, lipid management, diabetes, heart failure and other comorbidities.   Expected Outcomes Short Term Goal: Understand basic principles of dietary content, such as calories, fat, sodium, cholesterol and nutrients.;Long Term Goal: Adherence to prescribed nutrition plan.      Nutrition Discharge: Nutrition Scores:     Nutrition Assessments - 09/23/16 1356      MEDFICTS Scores   Pre Score 12      Nutrition Goals Re-Evaluation:   Psychosocial: Target Goals: Acknowledge presence or absence of depression, maximize coping skills, provide positive support system. Participant is able to verbalize types and ability to use techniques and skills needed for reducing stress and depression.  Initial Review & Psychosocial Screening:     Initial Psych Review & Screening - 09/23/16 [place]? Yes   Comments --  brief assessment reveals no identifiable needs or further intervention at this time.     Barriers   Psychosocial barriers to participate in program There are no identifiable barriers or psychosocial needs.     Screening  Interventions   Interventions Encouraged to exercise      Quality of Life Scores:     Quality of Life - 09/23/16 1405      Quality of Life Scores   Health/Function Pre 25.07 %   Socioeconomic Pre 26.29 %   Psych/Spiritual Pre 28.93 %   Family Pre 28.8 %   GLOBAL Pre 26.66 %      PHQ-9: Recent Review Flowsheet Data    There is no flowsheet data to display.      Psychosocial Evaluation and Intervention:   Psychosocial Re-Evaluation:   Vocational Rehabilitation: Provide vocational rehab assistance to qualifying candidates.   Vocational Rehab Evaluation & Intervention:     Vocational Rehab - 09/23/16 1614      Initial Vocational Rehab Evaluation & Intervention   Assessment shows need for Vocational Rehabilitation No  Mr Dinardi has not returned to work yet but will be able to return to work without difficulty.      Education: Education Goals: Education classes will be provided on a weekly basis, covering required topics. Participant will state understanding/return demonstration of topics presented.  Learning Barriers/Preferences:     Learning Barriers/Preferences - 09/23/16 1211      Learning Barriers/Preferences   Learning Barriers None      Education Topics: Count Your Pulse:  -Group instruction provided by verbal instruction, demonstration, patient participation and written materials to support subject.  Instructors address importance of being able to find your pulse and how to count your pulse when at home without a heart monitor.  Patients get hands on experience counting their pulse with staff help and individually.   Heart Attack, Angina, and Risk Factor Modification:  -Group instruction provided by  verbal instruction, video, and written materials to support subject.  Instructors address signs and symptoms of angina and heart attacks.    Also discuss risk factors for heart disease and how to make changes to improve heart health risk  factors.   Functional Fitness:  -Group instruction provided by verbal instruction, demonstration, patient participation, and written materials to support subject.  Instructors address safety measures for doing things around the house.  Discuss how to get up and down off the floor, how to pick things up properly, how to safely get out of a chair without assistance, and balance training.   Meditation and Mindfulness:  -Group instruction provided by verbal instruction, patient participation, and written materials to support subject.  Instructor addresses importance of mindfulness and meditation practice to help reduce stress and improve awareness.  Instructor also leads participants through a meditation exercise.    Stretching for Flexibility and Mobility:  -Group instruction provided by verbal instruction, patient participation, and written materials to support subject.  Instructors lead participants through series of stretches that are designed to increase flexibility thus improving mobility.  These stretches are additional exercise for major muscle groups that are typically performed during regular warm up and cool down.   Hands Only CPR Anytime:  -Group instruction provided by verbal instruction, video, patient participation and written materials to support subject.  Instructors co-teach with AHA video for hands only CPR.  Participants get hands on experience with mannequins.   Nutrition I class: Heart Healthy Eating:  -Group instruction provided by PowerPoint slides, verbal discussion, and written materials to support subject matter. The instructor gives an explanation and review of the Therapeutic Lifestyle Changes diet recommendations, which includes a discussion on lipid goals, dietary fat, sodium, fiber, plant stanol/sterol esters, sugar, and the components of a well-balanced, healthy diet.   Nutrition II class: Lifestyle Skills:  -Group instruction provided by PowerPoint slides, verbal  discussion, and written materials to support subject matter. The instructor gives an explanation and review of label reading, grocery shopping for heart health, heart healthy recipe modifications, and ways to make healthier choices when eating out.   Diabetes Question & Answer:  -Group instruction provided by PowerPoint slides, verbal discussion, and written materials to support subject matter. The instructor gives an explanation and review of diabetes co-morbidities, pre- and post-prandial blood glucose goals, pre-exercise blood glucose goals, signs, symptoms, and treatment of hypoglycemia and hyperglycemia, and foot care basics.   Diabetes Blitz:  -Group instruction provided by PowerPoint slides, verbal discussion, and written materials to support subject matter. The instructor gives an explanation and review of the physiology behind type 1 and type 2 diabetes, diabetes medications and rational behind using different medications, pre- and post-prandial blood glucose recommendations and Hemoglobin A1c goals, diabetes diet, and exercise including blood glucose guidelines for exercising safely.    Portion Distortion:  -Group instruction provided by PowerPoint slides, verbal discussion, written materials, and food models to support subject matter. The instructor gives an explanation of serving size versus portion size, changes in portions sizes over the last 20 years, and what consists of a serving from each food group.   Stress Management:  -Group instruction provided by verbal instruction, video, and written materials to support subject matter.  Instructors review role of stress in heart disease and how to cope with stress positively.     Exercising on Your Own:  -Group instruction provided by verbal instruction, power point, and written materials to support subject.  Instructors discuss benefits of exercise, components  of exercise, frequency and intensity of exercise, and end points for  exercise.  Also discuss use of nitroglycerin and activating EMS.  Review options of places to exercise outside of rehab.  Review guidelines for sex with heart disease.   Cardiac Drugs I:  -Group instruction provided by verbal instruction and written materials to support subject.  Instructor reviews cardiac drug classes: antiplatelets, anticoagulants, beta blockers, and statins.  Instructor discusses reasons, side effects, and lifestyle considerations for each drug class.   Cardiac Drugs II:  -Group instruction provided by verbal instruction and written materials to support subject.  Instructor reviews cardiac drug classes: angiotensin converting enzyme inhibitors (ACE-I), angiotensin II receptor blockers (ARBs), nitrates, and calcium channel blockers.  Instructor discusses reasons, side effects, and lifestyle considerations for each drug class.   Anatomy and Physiology of the Circulatory System:  -Group instruction provided by verbal instruction, video, and written materials to support subject.  Reviews functional anatomy of heart, how it relates to various diagnoses, and what role the heart plays in the overall system.   Knowledge Questionnaire Score:     Knowledge Questionnaire Score - 09/23/16 1212      Knowledge Questionnaire Score   Pre Score 21/24      Core Components/Risk Factors/Patient Goals at Admission:     Personal Goals and Risk Factors at Admission - 09/23/16 1507      Core Components/Risk Factors/Patient Goals on Admission   Intervention Weight Management: Develop a combined nutrition and exercise program designed to reach desired caloric intake, while maintaining appropriate intake of nutrient and fiber, sodium and fats, and appropriate energy expenditure required for the weight goal.;Weight Management: Provide education and appropriate resources to help participant work on and attain dietary goals.;Weight Management/Obesity: Establish reasonable short term and long  term weight goals.   Admit Weight 248 lb (112.5 kg)   Goal Weight: Short Term 223 lb (101.2 kg)   Goal Weight: Long Term 188 lb (85.3 kg)   Expected Outcomes Short Term: Continue to assess and modify interventions until short term weight is achieved;Long Term: Adherence to nutrition and physical activity/exercise program aimed toward attainment of established weight goal;Weight Maintenance: Understanding of the daily nutrition guidelines, which includes 25-35% calories from fat, 7% or less cal from saturated fats, less than 200mg  cholesterol, less than 1.5gm of sodium, & 5 or more servings of fruits and vegetables daily;Weight Loss: Understanding of general recommendations for a balanced deficit meal plan, which promotes 1-2 lb weight loss per week and includes a negative energy balance of [id number] kcal/d;Understanding recommendations for meals to include 15-35% energy as protein, 25-35% energy from fat, 35-60% energy from carbohydrates, less than 200mg  of dietary cholesterol, 20-35 gm of total fiber daily;Understanding of distribution of calorie intake throughout the day with the consumption of 4-5 meals/snacks;Weight Gain: Understanding of general recommendations for a high calorie, high protein meal plan that promotes weight gain by distributing calorie intake throughout the day with the consumption for 4-5 meals, snacks, and/or supplements   Increase Strength and Stamina Yes   Intervention Provide advice, education, support and counseling about physical activity/exercise needs.   Expected Outcomes Achievement of increased cardiorespiratory fitness and enhanced flexibility, muscular endurance and strength shown through measurements of functional capacity and personal statement of participant.   Hypertension Yes   Intervention Provide education on lifestyle modifcations including regular physical activity/exercise, weight management, moderate sodium restriction and increased consumption of fresh fruit,  vegetables, and low fat dairy, alcohol moderation, and smoking cessation.;Monitor prescription use compliance.  Expected Outcomes Short Term: Continued assessment and intervention until BP is < 140/31mm HG in hypertensive participants. < 130/39mm HG in hypertensive participants with diabetes, heart failure or chronic kidney disease.;Long Term: Maintenance of blood pressure at goal levels.   Lipids Yes   Intervention Provide education and support for participant on nutrition & aerobic/resistive exercise along with prescribed medications to achieve LDL 70mg , HDL >40mg .   Expected Outcomes Short Term: Participant states understanding of desired cholesterol values and is compliant with medications prescribed. Participant is following exercise prescription and nutrition guidelines.;Long Term: Cholesterol controlled with medications as prescribed, with individualized exercise RX and with personalized nutrition plan. Value goals: LDL < 70mg , HDL > 40 mg.   Personal Goal Other Yes   Personal Goal lose 25lbs and overall 60lbs, increase strength and stamina, decrease meds and increase overall activity level   Intervention individualized exercise program and education session such as home exercise program, nutritional education, HTN, med management, ect      Core Components/Risk Factors/Patient Goals Review:    Core Components/Risk Factors/Patient Goals at Discharge (Final Review):    ITP Comments:     ITP Comments    Row Name 09/23/16 0842 09/23/16 1608         ITP Comments Fransico Him MD Dr Fransico Him, Medical Director         Comments: Jeneen Rinks attended orientation from 0800 to 10000 to review rules and guidelines for program. Completed 6 minute walk test, Intitial ITP, and exercise prescription.  VSS. Telemetry-Sinus rhythm with a bundle branch block, this has been previously documented.  Asymptomatic.Barnet Pall, RN,BSN 09/23/2016 4:29 PM

## 2016-09-27 ENCOUNTER — Other Ambulatory Visit: Payer: Self-pay | Admitting: Pulmonary Disease

## 2016-09-27 ENCOUNTER — Encounter (HOSPITAL_COMMUNITY)
Admission: RE | Admit: 2016-09-27 | Discharge: 2016-09-27 | Disposition: A | Discharge status: Home / Self Care | Payer: 59 | Source: Ambulatory Visit | Attending: Cardiology | Admitting: Cardiology

## 2016-09-27 ENCOUNTER — Encounter (HOSPITAL_COMMUNITY): Payer: 59

## 2016-09-27 ENCOUNTER — Other Ambulatory Visit: Payer: Self-pay

## 2016-09-27 ENCOUNTER — Telehealth: Payer: Self-pay | Admitting: Pulmonary Disease

## 2016-09-27 ENCOUNTER — Encounter: Payer: Self-pay | Admitting: Cardiology

## 2016-09-27 DIAGNOSIS — Z955 Presence of coronary angioplasty implant and graft: Secondary | ICD-10-CM | POA: Diagnosis not present

## 2016-09-27 DIAGNOSIS — K219 Gastro-esophageal reflux disease without esophagitis: Secondary | ICD-10-CM | POA: Diagnosis not present

## 2016-09-27 DIAGNOSIS — I2510 Atherosclerotic heart disease of native coronary artery without angina pectoris: Secondary | ICD-10-CM | POA: Diagnosis not present

## 2016-09-27 DIAGNOSIS — G4733 Obstructive sleep apnea (adult) (pediatric): Secondary | ICD-10-CM

## 2016-09-27 DIAGNOSIS — R079 Chest pain, unspecified: Secondary | ICD-10-CM | POA: Diagnosis not present

## 2016-09-27 MED ORDER — POTASSIUM CHLORIDE ER 8 MEQ PO TBCR: 8.0000 meq | EXTENDED_RELEASE_TABLET | Freq: Every day | ORAL | 0 refills | Status: DC

## 2016-09-27 NOTE — Progress Notes (Addendum)
Daily Session Note  Patient Details  Name: Anthony Chapman MRN: [id number] Date of Birth: 1950/08/15 Referring Provider:   Flowsheet Row CARDIAC REHAB PHASE II ORIENTATION from 09/23/2016 in [facility]  Referring Provider  Martinique, Peter MD      Encounter Date: 09/27/2016  Check In:     Session Check In - 09/27/16 1015      Pain Assessment   Currently in Pain? No/denies      Capillary Blood Glucose: No results found for this or any previous visit (from the past 24 hour(s)).   Goals Met:  did not exercise for a full 30 minutes  Goals Unmet: blood pressure  Comments: Anthony Chapman started cardiac rehab today.  Anthony Chapman reported having chest discomfort today after walking 4 laps on the walking track. Blood pressure noted at 160/80, telemetry-Sinus rhythm with a bundle branch block. Anthony Chapman says the discomfort lasted about 4- 5 minutes and went way after rest. Anthony Chapman says he experienced some chest discomfort when walking at home during the weekend that went a away after rest. Anthony Chapman has not taken any sublingual nitroglycerin.  Medication list reconciled. Pt denies barriers to medicaiton compliance. Anthony Pho PA called and notified. 12 lead ECG obtained. Anthony Chapman reviewed today's ECG and talked with Anthony Chapman over the phone. Patient instructed to call the office if he has any more chest pain. Anthony Chapman said Anthony Chapman can resume exercise at cardiac rehab. Exit blood pressure 114/70. Heart rate 70 PSYCHOSOCIAL ASSESSMENT:  PHQ-0. Pt exhibits positive coping skills, hopeful outlook with supportive family. No psychosocial needs identified at this time, no psychosocial interventions necessary.    Pt enjoys [employer] work and sports.   Pt oriented to exercise equipment and routine.    Understanding verbalized. Anthony Chapman will return to exercise on Wednesday. Will continue to monitor the patient throughout  the program. No complaints or chest pain upon exit from  cardiac rehab.Barnet Pall, RN,BSN 09/27/2016 12:09 PM    Dr. Fransico Him is Medical Director for Cardiac Rehab at [facility].

## 2016-09-27 NOTE — Telephone Encounter (Signed)
Potassium sent to pharmacy needs OV for further refills

## 2016-09-27 NOTE — Telephone Encounter (Addendum)
Spoke with patient and informed him of results. Pt was informed that he will be receiving a CPAP machine and going to a mask fitting. Pt was instructed to contact office once he receives machine to schedule follow up appointment. Pt did not have any questions. Orders were placed for CPAP machine and mask fitting. Nothing further needed.

## 2016-09-27 NOTE — Telephone Encounter (Signed)
    Please call the pt and tell the pt the [place]  showed OSA   Pt stops breathing 16   times an hour.   Home sleep study was done on : 09/22/16  Please order autoCPAP 5-15 cm H2O. Patient will need a mask fitting session. Patient will need a 1 month download.   Patient needs to be seen by me or any of the NPs/APPs  4-6 weeks after obtaining the cpap machine. Let me know if you receive this.   Thanks!   J. Shirl Harris, MD 09/27/2016, 2:33 PM

## 2016-09-28 DIAGNOSIS — G4733 Obstructive sleep apnea (adult) (pediatric): Secondary | ICD-10-CM | POA: Diagnosis not present

## 2016-09-29 ENCOUNTER — Encounter (HOSPITAL_COMMUNITY)
Admission: RE | Admit: 2016-09-29 | Discharge: 2016-09-29 | Disposition: A | Discharge status: Home / Self Care | Payer: 59 | Source: Ambulatory Visit | Attending: Cardiology | Admitting: Cardiology

## 2016-09-29 ENCOUNTER — Encounter (HOSPITAL_COMMUNITY): Payer: 59

## 2016-09-29 ENCOUNTER — Telehealth: Payer: Self-pay | Admitting: Cardiology

## 2016-09-29 ENCOUNTER — Other Ambulatory Visit: Payer: Self-pay | Admitting: *Deleted

## 2016-09-29 ENCOUNTER — Telehealth: Payer: Self-pay | Admitting: *Deleted

## 2016-09-29 DIAGNOSIS — Z955 Presence of coronary angioplasty implant and graft: Secondary | ICD-10-CM

## 2016-09-29 DIAGNOSIS — R079 Chest pain, unspecified: Secondary | ICD-10-CM | POA: Diagnosis not present

## 2016-09-29 DIAGNOSIS — G4710 Hypersomnia, unspecified: Secondary | ICD-10-CM

## 2016-09-29 MED ORDER — ISOSORBIDE MONONITRATE ER 30 MG PO TB24: 30.0000 mg | ORAL_TABLET | Freq: Every day | ORAL | 6 refills | Status: DC

## 2016-09-29 NOTE — Progress Notes (Signed)
Mr Anthony Chapman continued to have elevated exertional blood pressures at cardiac rehab today. Entry blood pressure 122/82 with a heart rate of 83. Blood pressure noted at 170/98 on the walking track. Exercise stopped. Patient switched to the arm crank with a recheck blood pressure of 148/90. Blood pressure noted at 150/104 on the arm ergometer. Exercise stopped with a recheck blood pressure of 118/80. Dr Doug Sou office called and notified about today's blood pressure elevation. Anthony Chapman denied having any chest discomfort today with exercise. Appointment made for Mr Anthony Chapman to follow up with Anthony Sousa Pa on Friday at 2:30pm. I asked Mr Anthony Chapman to hold off on exercising until he is evaluated at Dr Doug Sou office. Patient states understanding.  Barnet Pall, RN,BSN 09/29/2016 11:30 AM

## 2016-09-29 NOTE — Telephone Encounter (Signed)
New message      Pt c/o BP issue: STAT if pt c/o blurred vision, one-sided weakness or slurred speech  1. What are your last 5 BP readings? Today at cardiac rehab 170/98, 148/90 and 150/104 2. Are you having any other symptoms (ex. Dizziness, headache, blurred vision, passed out)? No symptoms  3. What is your BP issue? Calling to let the nurse know bp was high today and pt will see the PA on friday

## 2016-09-29 NOTE — Telephone Encounter (Signed)
PT CALLED TO GET IMDUR, SAID HE SPOKE TO BRITTANY STRADER AND SHE SAID SHE WOULD SEND THIS IN FOR PT IF HE NEEDED IT, I DO NOT SEE THIS AT ALL, I DO SEE A PHONE NOTE BUT NO MENTION OF IMDUR, THANKS

## 2016-09-29 NOTE — Telephone Encounter (Signed)
Cardiac Rehab called me about the patient while I was rounding in the hospital earlier this week. He is experiencing stable angina with exercise. I mentioned trying Imdur at that time but he declined. If he is now interested in this, please send in Imdur 30mg  daily (30 days, 6 refills). May need further dose adjustment in the future. This should help some with his elevated BP as well. Thank you!

## 2016-09-29 NOTE — Telephone Encounter (Signed)
Ackowledged-routed to PA to make aware.

## 2016-09-29 NOTE — Telephone Encounter (Signed)
Telephone Open    09/29/2016 [facility]  Roberts Gaudy, [place]  (Newest Message First)  Erma Heritage, [place]  to Me . Silverio Lay, RN     09/29/16 3:34 PM  Note    Cardiac Rehab called me about the patient while I was rounding in the hospital earlier this week. He is experiencing stable angina with exercise. I mentioned trying Imdur at that time but he declined. If he is now interested in this, please send in Imdur 30mg  daily (30 days, 6 refills). May need further dose adjustment in the future. This should help some with his elevated BP as well. Thank you!    Me  to Erma Heritage, [place]       09/29/16 2:38 PM  PT CALLED TO GET IMDUR, SAID HE SPOKE TO BRITTANY STRADER AND SHE SAID SHE WOULD SEND THIS IN FOR PT IF HE NEEDED IT, I DO NOT SEE THIS AT ALL, I DO SEE A PHONE NOTE BUT NO MENTION OF IMDUR, THANKS  Me      09/29/16 2:38 PM  Note    PT CALLED TO GET IMDUR, SAID HE SPOKE TO BRITTANY STRADER AND SHE SAID SHE WOULD SEND THIS IN FOR PT IF HE NEEDED IT, I DO NOT SEE THIS AT ALL, I DO SEE A PHONE NOTE BUT NO MENTION OF IMDUR, THANKS    [name]  to Me       09/29/16 2:36 PM  PT CALLED TO GET IMDUR, SAID HE SPOKE TO BRITTANY STRADER AND SHE SAID SHE WOULD SEND THIS IN FOR PT IF HE NEEDED IT, I DO NOT SEE THIS AT ALL, I DO SEE A PHONE NOTE BUT NO MENTION OF IMDUR, THANKS   brittany strader rx'd indur 30 mg qd, will inform pt and send it in, pt aware.

## 2016-10-01 ENCOUNTER — Encounter (HOSPITAL_COMMUNITY): Admission: RE | Admit: 2016-10-01 | Payer: 59 | Source: Ambulatory Visit

## 2016-10-01 ENCOUNTER — Encounter (HOSPITAL_COMMUNITY): Payer: 59

## 2016-10-01 ENCOUNTER — Ambulatory Visit: Payer: 59 | Admitting: Physician Assistant

## 2016-10-01 NOTE — Telephone Encounter (Signed)
Patient cancelled today's visit and rescheduled to 09/16/2016, will see then

## 2016-10-04 ENCOUNTER — Telehealth (HOSPITAL_COMMUNITY): Payer: Self-pay | Admitting: *Deleted

## 2016-10-04 ENCOUNTER — Encounter (HOSPITAL_COMMUNITY): Payer: 59

## 2016-10-04 ENCOUNTER — Encounter (HOSPITAL_COMMUNITY): Admission: RE | Admit: 2016-10-04 | Payer: 59 | Source: Ambulatory Visit

## 2016-10-04 ENCOUNTER — Telehealth: Payer: Self-pay | Admitting: Pulmonary Disease

## 2016-10-04 NOTE — Telephone Encounter (Signed)
Spoke with pt. Advised him that he would need to contact [facility]. Their number has been given to the pt. Nothing further was needed.

## 2016-10-04 NOTE — Telephone Encounter (Signed)
Spoke with pt, states [facility] [employer] has already contacted him and he is scheduled for a mask fit on Wednesday.  See previous phone note.  Nothing further needed.

## 2016-10-04 NOTE — Telephone Encounter (Signed)
Pt calling b/c he hasn't heard from anyone a/b him being measured for mask, please advise he can be reached @ [phone].Anthony Chapman

## 2016-10-06 ENCOUNTER — Encounter (HOSPITAL_COMMUNITY): Payer: 59

## 2016-10-06 ENCOUNTER — Ambulatory Visit (INDEPENDENT_AMBULATORY_CARE_PROVIDER_SITE_OTHER): Payer: 59 | Admitting: Physician Assistant

## 2016-10-06 ENCOUNTER — Encounter: Payer: Self-pay | Admitting: Physician Assistant

## 2016-10-06 VITALS — BP 116/70 | HR 80 | Ht 67.0 in | Wt 251.0 lb

## 2016-10-06 DIAGNOSIS — I25118 Atherosclerotic heart disease of native coronary artery with other forms of angina pectoris: Secondary | ICD-10-CM | POA: Diagnosis not present

## 2016-10-06 DIAGNOSIS — R7303 Prediabetes: Secondary | ICD-10-CM

## 2016-10-06 DIAGNOSIS — I10 Essential (primary) hypertension: Secondary | ICD-10-CM | POA: Diagnosis not present

## 2016-10-06 DIAGNOSIS — I2510 Atherosclerotic heart disease of native coronary artery without angina pectoris: Secondary | ICD-10-CM

## 2016-10-06 DIAGNOSIS — E785 Hyperlipidemia, unspecified: Secondary | ICD-10-CM

## 2016-10-06 DIAGNOSIS — Z9861 Coronary angioplasty status: Secondary | ICD-10-CM

## 2016-10-06 DIAGNOSIS — I209 Angina pectoris, unspecified: Secondary | ICD-10-CM

## 2016-10-06 MED ORDER — ASPIRIN 81 MG PO TBEC: 81.0000 mg | DELAYED_RELEASE_TABLET | Freq: Every day | ORAL | 0 refills | Status: AC

## 2016-10-06 NOTE — Progress Notes (Signed)
Cardiology Office Note    Date:  10/06/2016   ID:  Anthony Chapman, DOB [birth year], MRN [id number]  PCP:  Walker Kehr, MD  Cardiologist:  Dr. Martinique (previously [facility])  Chief Complaint  Patient presents with  . Follow-up    seen for Dr. Martinique, HTN during exercise    History of Present Illness:  Anthony Chapman is a 66 y.o. male with PMH of CAD (s/p DES to RCA in 2010, DES to RCA and LCx in 2015, DES to LCx 1/18), HTN, HLD, prediabetes and known LBBB. He will 08/16/2016 with worsening chest discomfort and dyspnea with exertion. Cardiac catheterization performed on 08/27/2016 showed 100% ISR of distal RCA with left-to-right collaterals, temperature ostial LAD in-stent restenosis, 80% mid left circumflex stenosis treated with resolute 3.0 x 18 mm DES. He was discharged on aspirin, Plavix, beta blocker, Lipitor and amlodipine. He his last office visit was on 09/21/2016, at which time he was doing well. His systolic blood pressure was in the 130s. He was started on Zetia daily. 2-3 month follow-up was recommended.  According to records faxed over by cardiac rehabilitation, his blood pressure is normal at rest, however was even lightheaded exercise, systolic blood pressure sometimes go up to 150 to 170s. Also noted recently according to the full record, he has been having stable angina with exercise. He was started on Imdur 30 mg daily.  Since he was started on Imdur, he noticed the frequency of his exertional chest discomfort has decreased. Now that he has mild chest discomfort 1-2 times a week. He says he is compliant with Plavix, however he is taking aspirin every other day instead of daily. He says he has always been on aspirin every other day even prior to recent cath. He denies any obvious bleeding issues, therefore I have asked him to change the aspirin dosage to daily instead. I emphasized to him repeatedly the importance of compliance with dual antiplatelet therapy.  He denies any chest discomfort at rest. I think his current angina symptom is quite stable. I recommended medical therapy if the symptom can be controlled. If he started having increasing frequency or duration of the symptom and chest pain at rest, I did urged him to contact us to consider either stress test or cardiac catheterization. Otherwise, I will refer him back to cardiac rehabilitation and see how he does. If he has further symptoms, we can consider increasing the Imdur to 60 mg daily. As his recent concern with blood pressure, I have reviewed records faxed over from cardiac rehabilitation, it does appear that with minimal activity, his blood pressure did increase. His blood pressure today is well controlled after addition of Imdur. I do not think we need to up titrate any medication today. He is obese with some deconditioning, this is likely responsible for the elevation was minimal exertion recently. In this case, I would encourage activity and weight loss.   Past Medical History:  Diagnosis Date  . Anginal pain ([place])   . Arthritis    "all over" (08/27/2016)  . CAD (coronary artery disease)    a.  stent placement to the RCA in 2010 b. cath in 2015 with PCI to the RCA (2.1mm x52mm Resolute DES) and LCx (3.69mm x2mm Resolute DES).  c. 08/2016: cath showing 100% ISR of d-RCA with collaterals, 10% Ost LAD ISR, 90% mid-Cx stenosis (3.0x7mm Resolute DES placed)  . ED (erectile dysfunction)   . GERD (gastroesophageal reflux disease)   .  HTN (hypertension)   . Hyperlipemia   . Myocardial infarction 2010; 2015    Past Surgical History:  Procedure Laterality Date  . CARDIAC CATHETERIZATION  09/2013  . CARDIAC CATHETERIZATION N/A 08/27/2016   Procedure: Left Heart Cath and Coronary Angiography;  Surgeon: Leonie Man, MD;  Location: [place] [facility];  Service: Cardiovascular;  Laterality: N/A;  . CARDIAC CATHETERIZATION N/A 08/27/2016   Procedure: Coronary Stent Intervention;  Surgeon: Leonie Man, MD;  Location: [place] [facility];  Service: Cardiovascular;  Laterality: N/A;  . CORONARY ANGIOPLASTY WITH STENT PLACEMENT  2010; 1/192018  . HERNIA REPAIR    . INGUINAL HERNIA REPAIR Left   . UMBILICAL HERNIA REPAIR      Current Medications: Outpatient Medications Prior to Visit  Medication Sig Dispense Refill  . amLODipine (NORVASC) 10 MG tablet Take 1 tablet (10 mg total) by mouth daily. 90 tablet 3  . atorvastatin (LIPITOR) 80 MG tablet Take 1 tablet (80 mg total) by mouth daily. Overdue for yearly physical w/labs must see MD for refills 30 tablet 0  . carvedilol (COREG) 25 MG tablet Take 1 tablet (25 mg total) by mouth 2 (two) times daily with a meal. 180 tablet 3  . cholecalciferol (VITAMIN D) 1000 units tablet Take 1,000 Units by mouth every other day.    . clopidogrel (PLAVIX) 75 MG tablet Take 75 mg by mouth daily.    Marland Kitchen ezetimibe (ZETIA) 10 MG tablet Take 1 tablet (10 mg total) by mouth daily. 30 tablet 3  . hydrALAZINE (APRESOLINE) 25 MG tablet Take 1 tablet (25 mg total) by mouth 3 (three) times daily. 90 tablet 3  . isosorbide mononitrate (IMDUR) 30 MG 24 hr tablet Take 30 mg by mouth daily.    . isosorbide mononitrate (IMDUR) 30 MG 24 hr tablet Take 1 tablet (30 mg total) by mouth daily. 30 tablet 6  . nitroGLYCERIN (NITROSTAT) 0.4 MG SL tablet Place 1 tablet (0.4 mg total) under the tongue every 5 (five) minutes as needed for chest pain. 25 tablet 12  . potassium chloride (KLOR-CON) 8 MEQ tablet Take 1 tablet (8 mEq total) by mouth daily. 30 tablet 0  . tetrahydrozoline 0.05 % ophthalmic solution Place 1-2 drops into both eyes 3 (three) times daily as needed (for dry/irritated eyes).    Marland Kitchen aspirin 81 MG EC tablet Take 81 mg by mouth every other day.      No facility-administered medications prior to visit.      Allergies:   Losartan   Social History   Social History  . Marital status: Married    Spouse name: N/A  . Number of children: N/A  . 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 Smoking status: Former Smoker    Packs/day: 1.00    Years: 30.00    Types: Cigarettes    Quit date: 08/16/1998  . Smokeless tobacco: Never Used  . Alcohol use No  . Drug use: No  . Sexual activity: Yes   Other Topics Concern  . None   Social History Narrative   Occupation: 2nd shift   Married.           Family History:  The patient's family history includes Arthritis in his father; CAD in his maternal aunt and mother; Hypertension in his father and mother.   ROS:   Please see the history of present illness.    ROS  All other systems reviewed and are negative.   PHYSICAL EXAM:   VS:  BP 116/70   Pulse 80   Ht 5\' 7"  (1.702 m)   Wt 251 lb (113.9 kg)   BMI 39.31 kg/m    GEN: Well nourished, well developed, in no acute distress  HEENT: normal  Neck: no JVD, carotid bruits, or masses Cardiac: RRR; no murmurs, rubs, or gallops,no edema  Respiratory:  clear to auscultation bilaterally, normal work of breathing GI: soft, nontender, nondistended, + BS MS: no deformity or atrophy  Skin: warm and dry, no rash Neuro:  Alert and Oriented x 3, Strength and sensation are intact Psych: euthymic mood, full affect  Wt Readings from Last 3 Encounters:  10/06/16 251 lb (113.9 kg)  09/23/16 249 lb 5.4 oz (113.1 kg)  09/21/16 249 lb (112.9 kg)      Studies/Labs Reviewed:   EKG:  EKG is not ordered today.    Recent Labs: 07/09/2016: ALT 13; TSH 3.94 08/28/2016: BUN 10; Creatinine, Ser 0.92; Hemoglobin 12.8; Platelets 136; Potassium 3.5; Sodium 138   Lipid Panel    Component Value Date/Time   CHOL 161 07/09/2016 1013   TRIG 96.0 07/09/2016 1013   HDL 28.90 (L) 07/09/2016 1013   CHOLHDL 6 07/09/2016 1013   VLDL 19.2 07/09/2016 1013   LDLCALC 113 (H) 07/09/2016 1013   LDLDIRECT 199.5 07/25/2012 0935    Additional studies/ records that were reviewed today include:   Cath  08/27/2016 Conclusion     Ost LAD to Prox LAD stent, 10 %stenosed.  Dist RCA Stent (Resolute DES 2.5 mg x 14 mm), 100 %stenosed. Fills via left to right collaterals  ____________CULPRIT______________________  Mid Cx lesion, 90 %stenosed after previous stent  A STENT RESOLUTE ONYX 3.0X18 drug eluting stent was successfully placed, and overlaps previously placed stent.  Post intervention, there is a 0% residual stenosis.  __________________________________________  The left ventricular ejection fraction is 50-55% by visual estimate.  LV end diastolic pressure is moderately elevated.   Assessment difficult PCI of mid circumflex stent overlapping the previous he placed proximal stent. Chronically occluded distal RCA stent with brisk left left right collaterals. Patent LAD stent  Plan:  Overnight observation with anticipated discharge tomorrow morning.  TR band removal per protocol.  Continue aspirin plus Plavix, essentially lifelong.  He aggressively treating cardiac risk factors with statin and antihypertensives. (Beta Blocker and Calcium Channel Blocker as he is allergic to ace inhibitors)        ASSESSMENT:    1. Coronary artery disease of native artery of native heart with stable angina pectoris ([place])   2. Hyperlipidemia LDL goal <70   3. Essential hypertension   4. Prediabetes      PLAN:  In order of problems listed above:  1. CAD s/p multiple PCI: He does seems to have stable angina, improved after addition of Imdur, if continued to have symptoms, may consider up titrate Imdur to 60 mg daily. He only have exertional symptom, and denies any chest pain rest. We have discussed signs symptom of worsening angina, he will contact cardiology if his symptom increasing frequency and duration or started having chest pain arrest. - For some reason, he is not on daily regiment of aspirin, instead he is on aspirin every other day. He does not seems to have obvious  bleeding issue. I will change him to aspirin 81 mg daily. He is compliant with his Plavix.  2. HTN: Blood pressure well controlled after addition of Imdur.  Recent high blood pressure on exertion is likely related to deconditioning and obesity.  3. HLD: Continue zetia and Lipitor. Plan for 4-6 weeks Outpatient Fasting Lipid Panel and LFT.  4. Prediabetes: Last hemoglobin 6.2, he is not on any oral antihyperglycemic medication.   Medication Adjustments/Labs and Tests Ordered: Current medicines are reviewed at length with the patient today.  Concerns regarding medicines are outlined above.  Medication changes, Labs and Tests ordered today are listed in the Patient Instructions below. Patient Instructions  Medication Instructions:  START Aspirin 81mg  Take 1 tablet once a day  Labwork: Your physician recommends that you return for lab work in: 4-6 weeks fasting LIPIDS AND LFT  Testing/Procedures: NONE  Follow-Up: Your physician recommends that you schedule a follow-up appointment in: KEEP UPCOMING APPOINTMENT WITH DR Martinique.   Any Other Special Instructions Will Be Listed Below (If Applicable).     If you need a refill on your cardiac medications before your next appointment, please call your pharmacy.     Hilbert Corrigan, [place]  10/06/2016 4:36 PM    [facility] [place], [place],   [address] Phone: [phone]; Fax: [phone]

## 2016-10-06 NOTE — Patient Instructions (Signed)
Medication Instructions:  START Aspirin 81mg  Take 1 tablet once a day  Labwork: Your physician recommends that you return for lab work in: 4-6 weeks fasting LIPIDS AND LFT  Testing/Procedures: NONE  Follow-Up: Your physician recommends that you schedule a follow-up appointment in: KEEP UPCOMING APPOINTMENT WITH DR Martinique.   Any Other Special Instructions Will Be Listed Below (If Applicable).     If you need a refill on your cardiac medications before your next appointment, please call your pharmacy.

## 2016-10-07 ENCOUNTER — Encounter (HOSPITAL_COMMUNITY): Payer: Self-pay | Admitting: *Deleted

## 2016-10-07 DIAGNOSIS — Z955 Presence of coronary angioplasty implant and graft: Secondary | ICD-10-CM

## 2016-10-07 NOTE — Progress Notes (Signed)
Cardiac Individual Treatment Plan  Patient Details  Name: Anthony Chapman MRN: [id number] Date of Birth: Jul 29, 1951 Referring Provider:   Flowsheet Row CARDIAC REHAB PHASE II ORIENTATION from 09/23/2016 in [facility]  Referring Provider  Martinique, Peter MD      Initial Encounter Date:  [place] PHASE II ORIENTATION from 09/23/2016 in [facility]  Date  09/23/16  Referring Provider  Martinique, Peter MD      Visit Diagnosis: 08/27/16 Status post coronary artery stent placement  Patient's Home Medications on Admission:  Current Outpatient Prescriptions:  .  amLODipine (NORVASC) 10 MG tablet, Take 1 tablet (10 mg total) by mouth daily., Disp: 90 tablet, Rfl: 3 .  aspirin 81 MG EC tablet, Take 1 tablet (81 mg total) by mouth daily., Disp: 30 tablet, Rfl: 0 .  atorvastatin (LIPITOR) 80 MG tablet, Take 1 tablet (80 mg total) by mouth daily. Overdue for yearly physical w/labs must see MD for refills, Disp: 30 tablet, Rfl: 0 .  carvedilol (COREG) 25 MG tablet, Take 1 tablet (25 mg total) by mouth 2 (two) times daily with a meal., Disp: 180 tablet, Rfl: 3 .  cholecalciferol (VITAMIN D) 1000 units tablet, Take 1,000 Units by mouth every other day., Disp: , Rfl:  .  clopidogrel (PLAVIX) 75 MG tablet, Take 75 mg by mouth daily., Disp: , Rfl:  .  ezetimibe (ZETIA) 10 MG tablet, Take 1 tablet (10 mg total) by mouth daily., Disp: 30 tablet, Rfl: 3 .  hydrALAZINE (APRESOLINE) 25 MG tablet, Take 1 tablet (25 mg total) by mouth 3 (three) times daily., Disp: 90 tablet, Rfl: 3 .  isosorbide mononitrate (IMDUR) 30 MG 24 hr tablet, Take 30 mg by mouth daily., Disp: , Rfl:  .  isosorbide mononitrate (IMDUR) 30 MG 24 hr tablet, Take 1 tablet (30 mg total) by mouth daily., Disp: 30 tablet, Rfl: 6 .  nitroGLYCERIN (NITROSTAT) 0.4 MG SL tablet, Place 1 tablet (0.4 mg total) under the tongue every 5 (five) minutes as needed for chest pain.,  Disp: 25 tablet, Rfl: 12 .  potassium chloride (KLOR-CON) 8 MEQ tablet, Take 1 tablet (8 mEq total) by mouth daily., Disp: 30 tablet, Rfl: 0 .  tetrahydrozoline 0.05 % ophthalmic solution, Place 1-2 drops into both eyes 3 (three) times daily as needed (for dry/irritated eyes)., Disp: , Rfl:   Past Medical History: Past Medical History:  Diagnosis Date  . Anginal pain ([place])   . Arthritis    "all over" (08/27/2016)  . CAD (coronary artery disease)    a.  stent placement to the RCA in 2010 b. cath in 2015 with PCI to the RCA (2.35mm x36mm Resolute DES) and LCx (3.36mm x39mm Resolute DES).  c. 08/2016: cath showing 100% ISR of d-RCA with collaterals, 10% Ost LAD ISR, 90% mid-Cx stenosis (3.0x6mm Resolute DES placed)  . ED (erectile dysfunction)   . GERD (gastroesophageal reflux disease)   . HTN (hypertension)   . Hyperlipemia   . Myocardial infarction 2010; 2015    Tobacco Use: History  Smoking Status  . Former Smoker  . Packs/day: 1.00  . Years: 30.00  . Types: Cigarettes  . Quit date: 08/16/1998  Smokeless Tobacco  . Never Used    Labs: Recent Review Flowsheet Data    Labs for ITP Cardiac and Pulmonary Rehab Latest Ref Rng & Units 08/12/2010 08/23/2011 07/25/2012 03/17/2015 07/09/2016   Cholestrol 0 - 200 mg/dL 272(H) - 258(H) 171  161   LDLCALC 0 - 99 mg/dL - - - 113(H) 113(H)   LDLDIRECT mg/dL 224.8 - 199.5 - -   HDL >39.00 mg/dL 34.90(L) - 33.30(L) 27.70(L) 28.90(L)   Trlycerides 0.0 - 149.0 mg/dL 129.0 - 162.0(H) 150.0(H) 96.0   Hemoglobin A1c 4.6 - 6.5 % - 6.1 6.4 - 6.2      Capillary Blood Glucose: No results found for: GLUCAP   Exercise Target Goals:    Exercise Program Goal: Individual exercise prescription set with THRR, safety & activity barriers. Participant demonstrates ability to understand and report RPE using BORG scale, to self-measure pulse accurately, and to acknowledge the importance of the exercise prescription.  Exercise Prescription Goal: Starting with  aerobic activity 30 plus minutes a day, 3 days per week for initial exercise prescription. Provide home exercise prescription and guidelines that participant acknowledges understanding prior to discharge.  Activity Barriers & Risk Stratification:     Activity Barriers & Cardiac Risk Stratification - 09/23/16 0839      Activity Barriers & Cardiac Risk Stratification   Activity Barriers None   Cardiac Risk Stratification Moderate      6 Minute Walk:     6 Minute Walk    Row Name 09/23/16 1214         6 Minute Walk   Phase Initial     Distance 1659 feet     Walk Time 6 minutes     # of Rest Breaks 0     MPH 3.1     METS 3.7     RPE 11     VO2 Peak 12.9     Symptoms No     Resting HR 78 bpm     Resting BP 138/84     Max Ex. HR 113 bpm     Max Ex. BP 180/108     2 Minute Post BP 110/70        Oxygen Initial Assessment:   Oxygen Re-Evaluation:   Oxygen Discharge (Final Oxygen Re-Evaluation):   Initial Exercise Prescription:     Initial Exercise Prescription - 09/23/16 1200      Date of Initial Exercise RX and Referring Provider   Date 09/23/16   Referring Provider Martinique, Peter MD     Bike   Level 0.8   Minutes 10   METs 2.35     NuStep   Level 2   Minutes 10   METs 1.9     Track   Laps 10   Minutes 10   METs 2.74     Prescription Details   Frequency (times per week) 5   Duration Progress to 45 minutes of aerobic exercise without signs/symptoms of physical distress     Intensity   THRR REST +  30   THRR 40-80% of Max Heartrate 62-124   Ratings of Perceived Exertion 11-13     Progression   Progression Continue to progress workloads to maintain intensity without signs/symptoms of physical distress.     Resistance Training   Training Prescription Yes   Weight 3   Reps 10-12      Perform Capillary Blood Glucose checks as needed.  Exercise Prescription Changes:     Exercise Prescription Changes    Row Name 10/04/16 1600              Response to Exercise   Blood Pressure (Admit) 122/82       Blood Pressure (Exercise) 170/98       Blood Pressure (Exit)  136/80       Heart Rate (Admit) 83 bpm       Heart Rate (Exercise) 103 bpm       Heart Rate (Exit) 79 bpm       Rating of Perceived Exertion (Exercise) 11       Duration Progress to 45 minutes of aerobic exercise without signs/symptoms of physical distress       Intensity THRR unchanged         Progression   Progression Continue to progress workloads to maintain intensity without signs/symptoms of physical distress.       Average METs 2.25         Resistance Training   Training Prescription Yes       Weight 3       Reps 10-15         NuStep   Level 2       Minutes 20       METs 2.1         Track   Laps 10       Minutes 10       METs 2.74          Exercise Comments:     Exercise Comments    Row Name 10/06/16 1652           Exercise Comments Reviewed METs and goals with pt.  Pt is doing well with exercise but is working to get his BP under control with exercise. Hopeful he can be released from his Dr. soon to return to exercise.          Exercise Goals and Review:     Exercise Goals    Row Name 10/06/16 1652             Exercise Goals   Increase Physical Activity Yes       Intervention Provide advice, education, support and counseling about physical activity/exercise needs.;Develop an individualized exercise prescription for aerobic and resistive training based on initial evaluation findings, risk stratification, comorbidities and participant's personal goals.       Expected Outcomes Achievement of increased cardiorespiratory fitness and enhanced flexibility, muscular endurance and strength shown through measurements of functional capacity and personal statement of participant.       Increase Strength and Stamina Yes       Intervention Provide advice, education, support and counseling about physical activity/exercise needs.;Develop an  individualized exercise prescription for aerobic and resistive training based on initial evaluation findings, risk stratification, comorbidities and participant's personal goals.       Expected Outcomes Achievement of increased cardiorespiratory fitness and enhanced flexibility, muscular endurance and strength shown through measurements of functional capacity and personal statement of participant.          Exercise Goals Re-Evaluation :     Exercise Goals Re-Evaluation    Row Name 10/04/16 1619             Exercise Goal Re-Evaluation   Exercise Goals Review Increase Physical Activity       Comments Pt is off to a good start and is very eager to exercise.  However, he has been consistently hypertensive with exercise and he has been limited to what he can do when he comes to CR for his safety.  He is going to see his Dr. on Wednesday regarding his BP and hopefully he will return to exercise this Friday       Expected Outcomes Adjust BP meds in order to better manage  BP during exericse, then continue with exercise routine in CR, begin a HEP in order to help increase exercise tolerance and functional capacity.             Discharge Exercise Prescription (Final Exercise Prescription Changes):     Exercise Prescription Changes - 10/04/16 1600      Response to Exercise   Blood Pressure (Admit) 122/82   Blood Pressure (Exercise) 170/98   Blood Pressure (Exit) 136/80   Heart Rate (Admit) 83 bpm   Heart Rate (Exercise) 103 bpm   Heart Rate (Exit) 79 bpm   Rating of Perceived Exertion (Exercise) 11   Duration Progress to 45 minutes of aerobic exercise without signs/symptoms of physical distress   Intensity THRR unchanged     Progression   Progression Continue to progress workloads to maintain intensity without signs/symptoms of physical distress.   Average METs 2.25     Resistance Training   Training Prescription Yes   Weight 3   Reps 10-15     NuStep   Level 2   Minutes 20    METs 2.1     Track   Laps 10   Minutes 10   METs 2.74      Nutrition:  Target Goals: Understanding of nutrition guidelines, daily intake of sodium 1500mg , cholesterol 200mg , calories 30% from fat and 7% or less from saturated fats, daily to have 5 or more servings of fruits and vegetables.  Biometrics:     Pre Biometrics - 09/23/16 1404      Pre Biometrics   Waist Circumference 47 inches   Hip Circumference 48.5 inches   Waist to Hip Ratio 0.97 %   Triceps Skinfold 27 mm   % Body Fat 37.5 %   Grip Strength 50 kg   Flexibility 8 in   Single Leg Stand 30 seconds       Nutrition Therapy Plan and Nutrition Goals:     Nutrition Therapy & Goals - 09/23/16 1357      Nutrition Therapy   Diet Carb Modified, Therapeutic Lifestyle Changes     Personal Nutrition Goals   Nutrition Goal Wt loss of 1-2 lb/week to a wt loss goal of 6-24 lb at graduation from rehab     [place], educate and counsel regarding individualized specific dietary modifications aiming towards targeted core components such as weight, hypertension, lipid management, diabetes, heart failure and other comorbidities.   Expected Outcomes Short Term Goal: Understand basic principles of dietary content, such as calories, fat, sodium, cholesterol and nutrients.;Long Term Goal: Adherence to prescribed nutrition plan.      Nutrition Discharge: Nutrition Scores:     Nutrition Assessments - 09/23/16 1356      MEDFICTS Scores   Pre Score 12      Nutrition Goals Re-Evaluation:   Nutrition Goals Re-Evaluation:   Nutrition Goals Discharge (Final Nutrition Goals Re-Evaluation):   Psychosocial: Target Goals: Acknowledge presence or absence of significant depression and/or stress, maximize coping skills, provide positive support system. Participant is able to verbalize types and ability to use techniques and skills needed for reducing stress and depression.  Initial Review  & Psychosocial Screening:     Initial Psych Review & Screening - 09/23/16 [place]? Yes   Comments --  brief assessment reveals no identifiable needs or further intervention at this time.     Barriers   Psychosocial barriers to participate in program  There are no identifiable barriers or psychosocial needs.     Screening Interventions   Interventions Encouraged to exercise      Quality of Life Scores:     Quality of Life - 09/23/16 1405      Quality of Life Scores   Health/Function Pre 25.07 %   Socioeconomic Pre 26.29 %   Psych/Spiritual Pre 28.93 %   Family Pre 28.8 %   GLOBAL Pre 26.66 %      PHQ-9: Recent Review Flowsheet Data    Depression screen [facility] 2/9 09/27/2016   Decreased Interest 0   Down, Depressed, Hopeless 0   PHQ - 2 Score 0     Interpretation of Total Score  Total Score Depression Severity:  1-4 = Minimal depression, 5-9 = Mild depression, 10-14 = Moderate depression, 15-19 = Moderately severe depression, 20-27 = Severe depression   Psychosocial Evaluation and Intervention:   Psychosocial Re-Evaluation:     Psychosocial Re-Evaluation    [place] Name 10/07/16 1239             Psychosocial Re-Evaluation   Current issues with None Identified       Interventions Encouraged to attend Cardiac Rehabilitation for the exercise       Continue Psychosocial Services  No Follow up required          Psychosocial Discharge (Final Psychosocial Re-Evaluation):     Psychosocial Re-Evaluation - 10/07/16 1239      Psychosocial Re-Evaluation   Current issues with None Identified   Interventions Encouraged to attend Cardiac Rehabilitation for the exercise   Continue Psychosocial Services  No Follow up required      Vocational Rehabilitation: Provide vocational rehab assistance to qualifying candidates.   Vocational Rehab Evaluation & Intervention:     Vocational Rehab - 09/23/16 1614      Initial  Vocational Rehab Evaluation & Intervention   Assessment shows need for Vocational Rehabilitation No  Mr Magouirk has not returned to work yet but will be able to return to work without difficulty.      Education: Education Goals: Education classes will be provided on a weekly basis, covering required topics. Participant will state understanding/return demonstration of topics presented.  Learning Barriers/Preferences:     Learning Barriers/Preferences - 09/23/16 1211      Learning Barriers/Preferences   Learning Barriers None      Education Topics: Count Your Pulse:  -Group instruction provided by verbal instruction, demonstration, patient participation and written materials to support subject.  Instructors address importance of being able to find your pulse and how to count your pulse when at home without a heart monitor.  Patients get hands on experience counting their pulse with staff help and individually.   Heart Attack, Angina, and Risk Factor Modification:  -Group instruction provided by verbal instruction, video, and written materials to support subject.  Instructors address signs and symptoms of angina and heart attacks.    Also discuss risk factors for heart disease and how to make changes to improve heart health risk factors.   Functional Fitness:  -Group instruction provided by verbal instruction, demonstration, patient participation, and written materials to support subject.  Instructors address safety measures for doing things around the house.  Discuss how to get up and down off the floor, how to pick things up properly, how to safely get out of a chair without assistance, and balance training.   Meditation and Mindfulness:  -Group instruction provided by verbal instruction, patient participation, and written materials to  support subject.  Instructor addresses importance of mindfulness and meditation practice to help reduce stress and improve awareness.  Instructor also  leads participants through a meditation exercise.  Flowsheet Row CARDIAC REHAB PHASE II EXERCISE from 09/29/2016 in [place]  Date  09/29/16  Instruction Review Code  2- meets goals/outcomes      Stretching for Flexibility and Mobility:  -Group instruction provided by verbal instruction, patient participation, and written materials to support subject.  Instructors lead participants through series of stretches that are designed to increase flexibility thus improving mobility.  These stretches are additional exercise for major muscle groups that are typically performed during regular warm up and cool down.   Hands Only CPR Anytime:  -Group instruction provided by verbal instruction, video, patient participation and written materials to support subject.  Instructors co-teach with AHA video for hands only CPR.  Participants get hands on experience with mannequins.   Nutrition I class: Heart Healthy Eating:  -Group instruction provided by PowerPoint slides, verbal discussion, and written materials to support subject matter. The instructor gives an explanation and review of the Therapeutic Lifestyle Changes diet recommendations, which includes a discussion on lipid goals, dietary fat, sodium, fiber, plant stanol/sterol esters, sugar, and the components of a well-balanced, healthy diet.   Nutrition II class: Lifestyle Skills:  -Group instruction provided by PowerPoint slides, verbal discussion, and written materials to support subject matter. The instructor gives an explanation and review of label reading, grocery shopping for heart health, heart healthy recipe modifications, and ways to make healthier choices when eating out.   Diabetes Question & Answer:  -Group instruction provided by PowerPoint slides, verbal discussion, and written materials to support subject matter. The instructor gives an explanation and review of diabetes co-morbidities, pre- and  post-prandial blood glucose goals, pre-exercise blood glucose goals, signs, symptoms, and treatment of hypoglycemia and hyperglycemia, and foot care basics.   Diabetes Blitz:  -Group instruction provided by PowerPoint slides, verbal discussion, and written materials to support subject matter. The instructor gives an explanation and review of the physiology behind type 1 and type 2 diabetes, diabetes medications and rational behind using different medications, pre- and post-prandial blood glucose recommendations and Hemoglobin A1c goals, diabetes diet, and exercise including blood glucose guidelines for exercising safely.    Portion Distortion:  -Group instruction provided by PowerPoint slides, verbal discussion, written materials, and food models to support subject matter. The instructor gives an explanation of serving size versus portion size, changes in portions sizes over the last 20 years, and what consists of a serving from each food group.   Stress Management:  -Group instruction provided by verbal instruction, video, and written materials to support subject matter.  Instructors review role of stress in heart disease and how to cope with stress positively.     Exercising on Your Own:  -Group instruction provided by verbal instruction, power point, and written materials to support subject.  Instructors discuss benefits of exercise, components of exercise, frequency and intensity of exercise, and end points for exercise.  Also discuss use of nitroglycerin and activating EMS.  Review options of places to exercise outside of rehab.  Review guidelines for sex with heart disease.   Cardiac Drugs I:  -Group instruction provided by verbal instruction and written materials to support subject.  Instructor reviews cardiac drug classes: antiplatelets, anticoagulants, beta blockers, and statins.  Instructor discusses reasons, side effects, and lifestyle considerations for each drug class.   Cardiac  Drugs II:  -  Group instruction provided by verbal instruction and written materials to support subject.  Instructor reviews cardiac drug classes: angiotensin converting enzyme inhibitors (ACE-I), angiotensin II receptor blockers (ARBs), nitrates, and calcium channel blockers.  Instructor discusses reasons, side effects, and lifestyle considerations for each drug class.   Anatomy and Physiology of the Circulatory System:  -Group instruction provided by verbal instruction, video, and written materials to support subject.  Reviews functional anatomy of heart, how it relates to various diagnoses, and what role the heart plays in the overall system.   Knowledge Questionnaire Score:     Knowledge Questionnaire Score - 09/23/16 1212      Knowledge Questionnaire Score   Pre Score 21/24      Core Components/Risk Factors/Patient Goals at Admission:     Personal Goals and Risk Factors at Admission - 09/23/16 1507      Core Components/Risk Factors/Patient Goals on Admission   Intervention Weight Management: Develop a combined nutrition and exercise program designed to reach desired caloric intake, while maintaining appropriate intake of nutrient and fiber, sodium and fats, and appropriate energy expenditure required for the weight goal.;Weight Management: Provide education and appropriate resources to help participant work on and attain dietary goals.;Weight Management/Obesity: Establish reasonable short term and long term weight goals.   Admit Weight 248 lb (112.5 kg)   Goal Weight: Short Term 223 lb (101.2 kg)   Goal Weight: Long Term 188 lb (85.3 kg)   Expected Outcomes Short Term: Continue to assess and modify interventions until short term weight is achieved;Long Term: Adherence to nutrition and physical activity/exercise program aimed toward attainment of established weight goal;Weight Maintenance: Understanding of the daily nutrition guidelines, which includes 25-35% calories from fat, 7% or  less cal from saturated fats, less than 200mg  cholesterol, less than 1.5gm of sodium, & 5 or more servings of fruits and vegetables daily;Weight Loss: Understanding of general recommendations for a balanced deficit meal plan, which promotes 1-2 lb weight loss per week and includes a negative energy balance of [phone] kcal/d;Understanding recommendations for meals to include 15-35% energy as protein, 25-35% energy from fat, 35-60% energy from carbohydrates, less than 200mg  of dietary cholesterol, 20-35 gm of total fiber daily;Understanding of distribution of calorie intake throughout the day with the consumption of 4-5 meals/snacks;Weight Gain: Understanding of general recommendations for a high calorie, high protein meal plan that promotes weight gain by distributing calorie intake throughout the day with the consumption for 4-5 meals, snacks, and/or supplements   Increase Strength and Stamina Yes   Intervention Provide advice, education, support and counseling about physical activity/exercise needs.   Expected Outcomes Achievement of increased cardiorespiratory fitness and enhanced flexibility, muscular endurance and strength shown through measurements of functional capacity and personal statement of participant.   Hypertension Yes   Intervention Provide education on lifestyle modifcations including regular physical activity/exercise, weight management, moderate sodium restriction and increased consumption of fresh fruit, vegetables, and low fat dairy, alcohol moderation, and smoking cessation.;Monitor prescription use compliance.   Expected Outcomes Short Term: Continued assessment and intervention until BP is < 140/60mm HG in hypertensive participants. < 130/21mm HG in hypertensive participants with diabetes, heart failure or chronic kidney disease.;Long Term: Maintenance of blood pressure at goal levels.   Lipids Yes   Intervention Provide education and support for participant on nutrition &  aerobic/resistive exercise along with prescribed medications to achieve LDL 70mg , HDL >40mg .   Expected Outcomes Short Term: Participant states understanding of desired cholesterol values and is compliant with medications prescribed. Participant  is following exercise prescription and nutrition guidelines.;Long Term: Cholesterol controlled with medications as prescribed, with individualized exercise RX and with personalized nutrition plan. Value goals: LDL < 70mg , HDL > 40 mg.   Personal Goal Other Yes   Personal Goal lose 25lbs and overall 60lbs, increase strength and stamina, decrease meds and increase overall activity level   Intervention individualized exercise program and education session such as home exercise program, nutritional education, HTN, med management, ect      Core Components/Risk Factors/Patient Goals Review:    Core Components/Risk Factors/Patient Goals at Discharge (Final Review):    ITP Comments:     ITP Comments    Row Name 09/23/16 0842 09/23/16 1608         ITP Comments Fransico Him MD Dr Fransico Him, Medical Director         Comments: Mr Eftink has attended 3 exercise sessions and has been on hold due to issues with hypertension. Will continue to monitor Mr Berhe's blood pressures at cardiac rehab when he returns to exercise.Barnet Pall, RN,BSN 10/07/2016 12:43 PM

## 2016-10-08 ENCOUNTER — Encounter (HOSPITAL_COMMUNITY)
Admission: RE | Admit: 2016-10-08 | Discharge: 2016-10-08 | Disposition: A | Discharge status: Home / Self Care | Payer: 59 | Source: Ambulatory Visit | Attending: Cardiology | Admitting: Cardiology

## 2016-10-08 ENCOUNTER — Encounter (HOSPITAL_COMMUNITY): Payer: 59

## 2016-10-08 DIAGNOSIS — M1990 Unspecified osteoarthritis, unspecified site: Secondary | ICD-10-CM | POA: Insufficient documentation

## 2016-10-08 DIAGNOSIS — E785 Hyperlipidemia, unspecified: Secondary | ICD-10-CM | POA: Insufficient documentation

## 2016-10-08 DIAGNOSIS — Z955 Presence of coronary angioplasty implant and graft: Secondary | ICD-10-CM | POA: Insufficient documentation

## 2016-10-08 DIAGNOSIS — R079 Chest pain, unspecified: Secondary | ICD-10-CM | POA: Insufficient documentation

## 2016-10-08 DIAGNOSIS — I252 Old myocardial infarction: Secondary | ICD-10-CM | POA: Insufficient documentation

## 2016-10-08 DIAGNOSIS — I2510 Atherosclerotic heart disease of native coronary artery without angina pectoris: Secondary | ICD-10-CM | POA: Insufficient documentation

## 2016-10-08 DIAGNOSIS — K219 Gastro-esophageal reflux disease without esophagitis: Secondary | ICD-10-CM | POA: Insufficient documentation

## 2016-10-08 DIAGNOSIS — I10 Essential (primary) hypertension: Secondary | ICD-10-CM | POA: Insufficient documentation

## 2016-10-08 DIAGNOSIS — N529 Male erectile dysfunction, unspecified: Secondary | ICD-10-CM | POA: Insufficient documentation

## 2016-10-08 DIAGNOSIS — Z87891 Personal history of nicotine dependence: Secondary | ICD-10-CM | POA: Insufficient documentation

## 2016-10-11 ENCOUNTER — Encounter (HOSPITAL_COMMUNITY): Payer: 59

## 2016-10-13 ENCOUNTER — Encounter (HOSPITAL_COMMUNITY): Admission: RE | Admit: 2016-10-13 | Payer: 59 | Source: Ambulatory Visit

## 2016-10-13 ENCOUNTER — Encounter (HOSPITAL_COMMUNITY): Payer: 59

## 2016-10-15 ENCOUNTER — Encounter (HOSPITAL_COMMUNITY): Payer: Self-pay | Admitting: *Deleted

## 2016-10-15 ENCOUNTER — Encounter (HOSPITAL_COMMUNITY): Payer: 59

## 2016-10-15 ENCOUNTER — Encounter (HOSPITAL_COMMUNITY): Admission: RE | Admit: 2016-10-15 | Payer: 59 | Source: Ambulatory Visit

## 2016-10-15 ENCOUNTER — Telehealth (HOSPITAL_COMMUNITY): Payer: Self-pay | Admitting: *Deleted

## 2016-10-15 DIAGNOSIS — Z955 Presence of coronary angioplasty implant and graft: Secondary | ICD-10-CM

## 2016-10-15 NOTE — Progress Notes (Addendum)
Discharge Summary  Patient Details  Name: Anthony Chapman MRN: [id number] Date of Birth: 05/19/1951 Referring Provider:     CARDIAC REHAB PHASE II ORIENTATION from 09/23/2016 in [place]  Referring Provider  Martinique, Peter MD       Number of Visits: 3  Reason for Discharge:  Early Exit Anthony Chapman wants to participate in cardiac rehab at [facility] which is closer to his home  Smoking History:  History  Smoking Status  . Former Smoker  . Packs/day: 1.00  . Years: 30.00  . Types: Cigarettes  . Quit date: 08/16/1998  Smokeless Tobacco  . Never Used    Diagnosis:  08/27/16 Status post coronary artery stent placement  ADL UCSD:   Initial Exercise Prescription:     Initial Exercise Prescription - 09/23/16 1200      Date of Initial Exercise RX and Referring Provider   Date 09/23/16   Referring Provider Martinique, Peter MD     Bike   Level 0.8   Minutes 10   METs 2.35     NuStep   Level 2   Minutes 10   METs 1.9     Track   Laps 10   Minutes 10   METs 2.74     Prescription Details   Frequency (times per week) 5   Duration Progress to 45 minutes of aerobic exercise without signs/symptoms of physical distress     Intensity   THRR REST +  30   THRR 40-80% of Max Heartrate 62-124   Ratings of Perceived Exertion 11-13     Progression   Progression Continue to progress workloads to maintain intensity without signs/symptoms of physical distress.     Resistance Training   Training Prescription Yes   Weight 3   Reps 10-12      Discharge Exercise Prescription (Final Exercise Prescription Changes):     Exercise Prescription Changes - 10/04/16 1600      Response to Exercise   Blood Pressure (Admit) 122/82   Blood Pressure (Exercise) 170/98   Blood Pressure (Exit) 136/80   Heart Rate (Admit) 83 bpm   Heart Rate (Exercise) 103 bpm   Heart Rate (Exit) 79 bpm   Rating of Perceived Exertion (Exercise) 11   Duration Progress to 45 minutes of aerobic exercise without signs/symptoms of physical distress   Intensity THRR unchanged     Progression   Progression Continue to progress workloads to maintain intensity without signs/symptoms of physical distress.   Average METs 2.25     Resistance Training   Training Prescription Yes   Weight 3   Reps 10-15     NuStep   Level 2   Minutes 20   METs 2.1     Track   Laps 10   Minutes 10   METs 2.74      Functional Capacity:     6 Minute Walk    Row Name 09/23/16 1214         6 Minute Walk   Phase Initial     Distance 1659 feet     Walk Time 6 minutes     # of Rest Breaks 0     MPH 3.1     METS 3.7     RPE 11     VO2 Peak 12.9     Symptoms No     Resting HR 78 bpm     Resting BP 138/84     Max  Ex. HR 113 bpm     Max Ex. BP 180/108     2 Minute Post BP 110/70        Psychological, QOL, Others - Outcomes: PHQ 2/9: Depression screen PHQ 2/9 09/27/2016  Decreased Interest 0  Down, Depressed, Hopeless 0  PHQ - 2 Score 0    Quality of Life:     Quality of Life - 09/23/16 1405      Quality of Life Scores   Health/Function Pre 25.07 %   Socioeconomic Pre 26.29 %   Psych/Spiritual Pre 28.93 %   Family Pre 28.8 %   GLOBAL Pre 26.66 %      Personal Goals: Goals established at orientation with interventions provided to work toward goal.     Personal Goals and Risk Factors at Admission - 09/23/16 1507      Core Components/Risk Factors/Patient Goals on Admission   Intervention Weight Management: Develop a combined nutrition and exercise program designed to reach desired caloric intake, while maintaining appropriate intake of nutrient and fiber, sodium and fats, and appropriate energy expenditure required for the weight goal.;Weight Management: Provide education and appropriate resources to help participant work on and attain dietary goals.;Weight Management/Obesity: Establish reasonable short term and long term weight  goals.   Admit Weight 248 lb (112.5 kg)   Goal Weight: Short Term 223 lb (101.2 kg)   Goal Weight: Long Term 188 lb (85.3 kg)   Expected Outcomes Short Term: Continue to assess and modify interventions until short term weight is achieved;Long Term: Adherence to nutrition and physical activity/exercise program aimed toward attainment of established weight goal;Weight Maintenance: Understanding of the daily nutrition guidelines, which includes 25-35% calories from fat, 7% or less cal from saturated fats, less than 200mg  cholesterol, less than 1.5gm of sodium, & 5 or more servings of fruits and vegetables daily;Weight Loss: Understanding of general recommendations for a balanced deficit meal plan, which promotes 1-2 lb weight loss per week and includes a negative energy balance of [phone] kcal/d;Understanding recommendations for meals to include 15-35% energy as protein, 25-35% energy from fat, 35-60% energy from carbohydrates, less than 200mg  of dietary cholesterol, 20-35 gm of total fiber daily;Understanding of distribution of calorie intake throughout the day with the consumption of 4-5 meals/snacks;Weight Gain: Understanding of general recommendations for a high calorie, high protein meal plan that promotes weight gain by distributing calorie intake throughout the day with the consumption for 4-5 meals, snacks, and/or supplements   Increase Strength and Stamina Yes   Intervention Provide advice, education, support and counseling about physical activity/exercise needs.   Expected Outcomes Achievement of increased cardiorespiratory fitness and enhanced flexibility, muscular endurance and strength shown through measurements of functional capacity and personal statement of participant.   Hypertension Yes   Intervention Provide education on lifestyle modifcations including regular physical activity/exercise, weight management, moderate sodium restriction and increased consumption of fresh fruit, vegetables,  and low fat dairy, alcohol moderation, and smoking cessation.;Monitor prescription use compliance.   Expected Outcomes Short Term: Continued assessment and intervention until BP is < 140/38mm HG in hypertensive participants. < 130/26mm HG in hypertensive participants with diabetes, heart failure or chronic kidney disease.;Long Term: Maintenance of blood pressure at goal levels.   Lipids Yes   Intervention Provide education and support for participant on nutrition & aerobic/resistive exercise along with prescribed medications to achieve LDL 70mg , HDL >40mg .   Expected Outcomes Short Term: Participant states understanding of desired cholesterol values and is compliant with medications prescribed. Participant is following  exercise prescription and nutrition guidelines.;Long Term: Cholesterol controlled with medications as prescribed, with individualized exercise RX and with personalized nutrition plan. Value goals: LDL < 70mg , HDL > 40 mg.   Personal Goal Other Yes   Personal Goal lose 25lbs and overall 60lbs, increase strength and stamina, decrease meds and increase overall activity level   Intervention individualized exercise program and education session such as home exercise program, nutritional education, HTN, med management, ect       Personal Goals Discharge:   Nutrition & Weight - Outcomes:     Pre Biometrics - 09/23/16 1404      Pre Biometrics   Waist Circumference 47 inches   Hip Circumference 48.5 inches   Waist to Hip Ratio 0.97 %   Triceps Skinfold 27 mm   % Body Fat 37.5 %   Grip Strength 50 kg   Flexibility 8 in   Single Leg Stand 30 seconds       Nutrition:     Nutrition Therapy & Goals - 09/23/16 1357      Nutrition Therapy   Diet Carb Modified, Therapeutic Lifestyle Changes     Personal Nutrition Goals   Nutrition Goal Wt loss of 1-2 lb/week to a wt loss goal of 6-24 lb at graduation from rehab     [place], educate and  counsel regarding individualized specific dietary modifications aiming towards targeted core components such as weight, hypertension, lipid management, diabetes, heart failure and other comorbidities.   Expected Outcomes Short Term Goal: Understand basic principles of dietary content, such as calories, fat, sodium, cholesterol and nutrients.;Long Term Goal: Adherence to prescribed nutrition plan.      Nutrition Discharge:     Nutrition Assessments - 09/23/16 1356      MEDFICTS Scores   Pre Score 12      Education Questionnaire Score:     Knowledge Questionnaire Score - 09/23/16 1212      Knowledge Questionnaire Score   Pre Score 21/24      Anthony Chapman attended 3 exercise sessions and decided not to return because of family obligations and Fernando would like to continue exercise at [facility]. Will discharge from cardiac rehab and contact [place], RN,BSN 10/28/2016 11:42 AM

## 2016-10-18 ENCOUNTER — Encounter (HOSPITAL_COMMUNITY): Payer: 59

## 2016-10-20 ENCOUNTER — Encounter (HOSPITAL_COMMUNITY): Payer: 59

## 2016-10-22 ENCOUNTER — Encounter (HOSPITAL_COMMUNITY): Payer: 59

## 2016-10-25 ENCOUNTER — Encounter (HOSPITAL_COMMUNITY): Payer: 59

## 2016-10-27 ENCOUNTER — Encounter (HOSPITAL_COMMUNITY): Payer: 59

## 2016-10-27 NOTE — Addendum Note (Signed)
Encounter addended by: Meta Hatchet on: 10/27/2016  3:08 PM<BR>    Actions taken: Visit Navigator Flowsheet section accepted

## 2016-10-29 ENCOUNTER — Encounter (HOSPITAL_COMMUNITY): Payer: 59

## 2016-11-01 ENCOUNTER — Encounter (HOSPITAL_COMMUNITY): Payer: 59

## 2016-11-03 ENCOUNTER — Encounter (HOSPITAL_COMMUNITY): Payer: 59

## 2016-11-05 ENCOUNTER — Encounter (HOSPITAL_COMMUNITY): Payer: 59

## 2016-11-08 ENCOUNTER — Telehealth: Payer: Self-pay | Admitting: Cardiology

## 2016-11-08 ENCOUNTER — Encounter (HOSPITAL_COMMUNITY): Payer: 59

## 2016-11-08 NOTE — Telephone Encounter (Signed)
New message   Melissa from [facility] in [place] is calling stating that they faxed over a referral, and need it to be signed. She is asking if it can be done because he started today.

## 2016-11-09 ENCOUNTER — Ambulatory Visit (INDEPENDENT_AMBULATORY_CARE_PROVIDER_SITE_OTHER): Payer: 59 | Admitting: Pulmonary Disease

## 2016-11-09 ENCOUNTER — Encounter: Payer: Self-pay | Admitting: Pulmonary Disease

## 2016-11-09 DIAGNOSIS — G4733 Obstructive sleep apnea (adult) (pediatric): Secondary | ICD-10-CM

## 2016-11-09 NOTE — Assessment & Plan Note (Addendum)
Patient has snoring, witnessed apneas, occasional gasping or choking and awakenings at night. Sleeps 5 hrs per night. Has unrefreshed sleep in am. (-) abnormal behaviro in sleep.   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   Occasional nap in pm.   ESS 13.   Patient had a home sleep study done in February 2018. AHI was 16. He is currently on auto CPAP 5-15 centimeters water. He feels better using it. More energy. Less sleepiness. He loves his CPAP machine.  Plan :  We extensively discussed the importance of treating OSA and the need to use PAP therapy.   Continue with autocpap 5-15 cm water.    Patient was instructed to have mask, tubings, filter, reservoir cleaned at least once a week with soapy water.  Patient was instructed to call the office if he/she is having issues with the PAP device.    I advised patient to obtain sufficient amount of sleep --  7 to 8 hours at least in a 24 hr period.  Patient was advised to follow good sleep hygiene.  Patient was advised NOT to engage in activities requiring concentration and/or vigilance if he/she is and  sleepy.  Patient is NOT to drive if he/she is sleepy.

## 2016-11-09 NOTE — Patient Instructions (Signed)
  It was a pleasure taking care of you today!  Continue using your CPAP machine.   Please make sure you use your CPAP device everytime you sleep.  We will monitor the usage of your machine per your insurance requirement.  Your insurance company may take the machine from you if you are not using it regularly.   Please clean the mask, tubings, filter, water reservoir with soapy water every week.  Please use distilled water for the water reservoir.   Please call the office or your machine provider (DME company) if you are having issues with the device.   Return to clinic in 1 YEAR   with  NP/APP

## 2016-11-09 NOTE — Progress Notes (Signed)
Subjective:    Patient ID: Anthony Chapman, male    DOB: 01/18/1951, 66 y.o.   MRN: [id number]  HPI   This is the case of ESTANISLADO SURGEON, 66 y.o. Male, who was referred by Dr. Lew Dawes in consultation regarding possible OSA.   As you very well know, patient has a 35PY smoking history, quit in his 72s, not been diagnosed with asthma or copd.   Patient has snoring, witnessed apneas, occasional gasping or choking and awakenings at night. Sleeps 5 hrs per night. Has unrefreshed sleep in am. (-) abnormal behaviro in sleep.   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   Occasional nap in pm.    ESS 13.   ROV 11/09/2016 Age and returns to the office for follow-up on his sleep apnea. Since last seen, he had a home sleep study which showed AHI of 16. He was started on auto CPAP 5-15 centimeters water. Download the last 30 days: 77%, AHI 2.2. He is a CPAP machine. Feels better using it. More energy. Less sleepiness. He loves his cpap machine!     Review of Systems  Constitutional: Negative.  Negative for fever and unexpected weight change.  HENT: Negative.  Negative for congestion, dental problem, ear pain, nosebleeds, postnasal drip, rhinorrhea, sinus pressure, sneezing, sore throat and trouble swallowing.   Eyes: Negative.  Negative for redness and itching.  Respiratory: Positive for shortness of breath. Negative for cough, chest tightness and wheezing.   Cardiovascular: Negative.  Negative for palpitations and leg swelling.  Gastrointestinal: Negative.  Negative for nausea and vomiting.  Endocrine: Negative.   Genitourinary: Negative.  Negative for dysuria.  Musculoskeletal: Negative.  Negative for joint swelling.  Skin: Negative.  Negative for rash.  Allergic/Immunologic: Negative.   Neurological: Negative.  Negative for headaches.  Hematological: Bruises/bleeds easily.  Psychiatric/Behavioral: Negative.  Negative  for dysphoric mood. The patient is not nervous/anxious.        Objective:   Physical Exam  Vitals:  Vitals:   11/09/16 1112  BP: 122/64  Pulse: 76  SpO2: 98%  Weight: 251 lb 3.2 oz (113.9 kg)  Height: 5\' 9"  (1.753 m)    Constitutional/General:  Pleasant, well-nourished, well-developed, not in any distress,  Comfortably seating.  Well kempt  Body mass index is 37.1 kg/m. Wt Readings from Last 3 Encounters:  11/09/16 251 lb 3.2 oz (113.9 kg)  10/06/16 251 lb (113.9 kg)  09/23/16 249 lb 5.4 oz (113.1 kg)      HEENT: Pupils equal and reactive to light and accommodation. Anicteric sclerae. Normal nasal mucosa.   No oral  lesions,  mouth clear,  oropharynx clear, no postnasal drip. (-) Oral thrush. No dental caries.  Airway - Mallampati class III  Neck: No masses. Midline trachea. No JVD, (-) LAD. (-) bruits appreciated.  Respiratory/Chest: Grossly normal chest. (-) deformity. (-) Accessory muscle use.  Symmetric expansion. (-) Tenderness on palpation.  Resonant on percussion.  Diminished BS on both lower lung zones. (-) wheezing, crackles, rhonchi (-) egophony  Cardiovascular: Regular rate and  rhythm, heart sounds normal, no murmur or gallops, no peripheral edema  Gastrointestinal:  Normal bowel sounds. Soft, non-tender. No hepatosplenomegaly.  (-) masses.   Musculoskeletal:  Normal muscle tone. Normal gait.   Extremities: Grossly normal. (-) clubbing, cyanosis.  (-) edema  Skin: (-) rash,lesions seen.   Neurological/Psychiatric : alert, oriented to time, place, person. Normal mood  and affect          Assessment & Plan:  OSA (obstructive sleep apnea) Patient has snoring, witnessed apneas, occasional gasping or choking and awakenings at night. Sleeps 5 hrs per night. Has unrefreshed sleep in am. (-) abnormal behaviro in sleep.   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   Occasional nap  in pm.   ESS 13.   Patient had a home sleep study done in February 2018. AHI was 16. He is currently on auto CPAP 5-15 centimeters water. He feels better using it. More energy. Less sleepiness. He loves his CPAP machine.  Plan :  We extensively discussed the importance of treating OSA and the need to use PAP therapy.   Continue with autocpap 5-15 cm water.    Patient was instructed to have mask, tubings, filter, reservoir cleaned at least once a week with soapy water.  Patient was instructed to call the office if he/she is having issues with the PAP device.    I advised patient to obtain sufficient amount of sleep --  7 to 8 hours at least in a 24 hr period.  Patient was advised to follow good sleep hygiene.  Patient was advised NOT to engage in activities requiring concentration and/or vigilance if he/she is and  sleepy.  Patient is NOT to drive if he/she is sleepy.      Follow up in a year, sooner if with issues.   Monica Becton, MD 11/09/2016   11:34 AM [facility] [place] Pager: [phone] Office: [phone], Fax: [phone]

## 2016-11-10 ENCOUNTER — Encounter (HOSPITAL_COMMUNITY): Payer: 59

## 2016-11-10 NOTE — Telephone Encounter (Signed)
Spoke to [facility] with [facility] she did receive cardiac rehab order faxed on 11/08/16.

## 2016-11-12 ENCOUNTER — Encounter (HOSPITAL_COMMUNITY): Payer: 59

## 2016-11-15 ENCOUNTER — Encounter (HOSPITAL_COMMUNITY): Payer: 59

## 2016-11-17 ENCOUNTER — Encounter (HOSPITAL_COMMUNITY): Payer: 59

## 2016-11-19 ENCOUNTER — Encounter (HOSPITAL_COMMUNITY): Payer: 59

## 2016-11-22 ENCOUNTER — Encounter (HOSPITAL_COMMUNITY): Payer: 59

## 2016-11-24 ENCOUNTER — Encounter (HOSPITAL_COMMUNITY): Payer: 59

## 2016-11-26 ENCOUNTER — Encounter (HOSPITAL_COMMUNITY): Payer: 59

## 2016-11-29 ENCOUNTER — Encounter (HOSPITAL_COMMUNITY): Payer: 59

## 2016-12-01 ENCOUNTER — Encounter (HOSPITAL_COMMUNITY): Payer: 59

## 2016-12-03 ENCOUNTER — Encounter (HOSPITAL_COMMUNITY): Payer: 59

## 2016-12-04 NOTE — Progress Notes (Signed)
Cardiology Office Note    Date:  12/07/2016   ID:  Anthony Chapman, DOB July 11, 1951, MRN [id number]  PCP:  Walker Kehr, MD  Cardiologist:  Dr. Martinique   Chief Complaint  Patient presents with  . Coronary Artery Disease    History of Present Illness:  Anthony Chapman is a 66 y.o. male with PMH of CAD (s/p DES to RCA in 2010, DES to RCA and LCx in 2015, DES to LCx 1/18), HTN, HLD, prediabetes and known LBBB. He presented on 08/16/2016 with worsening chest discomfort and dyspnea with exertion. Cardiac catheterization performed on 08/27/2016 showed 100% ISR of distal RCA with left-to-right collaterals, 10% ostial LAD in-stent restenosis, 90% mid left circumflex stenosis treated with resolute 3.0 x 18 mm DES. He was discharged on aspirin, Plavix, beta blocker, Lipitor and amlodipine.   On follow up today he notes he is doing well. He has been actively participating in cardiac Rehab. Noted symptoms of angina with exercise at Rehab but this has improved significantly with addition of Imdur. Now he has no angina in his daily life but still has some angina at Rehab. He takes 1.5 tablets of Imdur when he goes to Rehab and 1 tablet other days. He is eating better and has lost 4 lbs. His energy level has improved. Overall he just feels better. He does have some ED that has been present for > 2 years.   Past Medical History:  Diagnosis Date  . Anginal pain ([place])   . Arthritis    "all over" (08/27/2016)  . CAD (coronary artery disease)    a.  stent placement to the RCA in 2010 b. cath in 2015 with PCI to the RCA (2.18mm x30mm Resolute DES) and LCx (3.35mm x67mm Resolute DES).  c. 08/2016: cath showing 100% ISR of d-RCA with collaterals, 10% Ost LAD ISR, 90% mid-Cx stenosis (3.0x64mm Resolute DES placed)  . ED (erectile dysfunction)   . GERD (gastroesophageal reflux disease)   . HTN (hypertension)   . Hyperlipemia   . Myocardial infarction ([place]) 2010; 2015    Past Surgical History:  Procedure  Laterality Date  . CARDIAC CATHETERIZATION  09/2013  . CARDIAC CATHETERIZATION N/A 08/27/2016   Procedure: Left Heart Cath and Coronary Angiography;  Surgeon: Leonie Man, MD;  Location: [facility];  Service: Cardiovascular;  Laterality: N/A;  . CARDIAC CATHETERIZATION N/A 08/27/2016   Procedure: Coronary Stent Intervention;  Surgeon: Leonie Man, MD;  Location: [facility];  Service: Cardiovascular;  Laterality: N/A;  . CORONARY ANGIOPLASTY WITH STENT PLACEMENT  2010; 1/192018  . HERNIA REPAIR    . INGUINAL HERNIA REPAIR Left   . UMBILICAL HERNIA REPAIR      Current Medications: Outpatient Medications Prior to Visit  Medication Sig Dispense Refill  . amLODipine (NORVASC) 10 MG tablet Take 1 tablet (10 mg total) by mouth daily. 90 tablet 3  . aspirin 81 MG EC tablet Take 1 tablet (81 mg total) by mouth daily. 30 tablet 0  . atorvastatin (LIPITOR) 80 MG tablet Take 1 tablet (80 mg total) by mouth daily. Overdue for yearly physical w/labs must see MD for refills 30 tablet 0  . carvedilol (COREG) 25 MG tablet Take 1 tablet (25 mg total) by mouth 2 (two) times daily with a meal. 180 tablet 3  . cholecalciferol (VITAMIN D) 1000 units tablet Take 1,000 Units by mouth every other day.    . clopidogrel (PLAVIX) 75 MG tablet Take 75 mg  by mouth daily.    Marland Kitchen ezetimibe (ZETIA) 10 MG tablet Take 1 tablet (10 mg total) by mouth daily. 30 tablet 3  . hydrALAZINE (APRESOLINE) 25 MG tablet Take 1 tablet (25 mg total) by mouth 3 (three) times daily. 90 tablet 3  . isosorbide mononitrate (IMDUR) 30 MG 24 hr tablet Take 30-45 mg by mouth daily. Take 1.5 tablets (45 mg total) by mouth on Mondays, Wednesdays, and Thursdays.    . nitroGLYCERIN (NITROSTAT) 0.4 MG SL tablet Place 1 tablet (0.4 mg total) under the tongue every 5 (five) minutes as needed for chest pain. 25 tablet 12  . potassium chloride (KLOR-CON) 8 MEQ tablet Take 1 tablet (8 mEq total) by mouth daily. 30 tablet 0  .  tetrahydrozoline 0.05 % ophthalmic solution Place 1-2 drops into both eyes 3 (three) times daily as needed (for dry/irritated eyes).    . ezetimibe (ZETIA) 10 MG tablet Take 10 mg by mouth.     No facility-administered medications prior to visit.      Allergies:   Losartan   Social History   Social History  . Marital status: Married    Spouse name: N/A  . Number of children: N/A  . 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 Smoking status: Former Smoker    Packs/day: 1.00    Years: 30.00    Types: Cigarettes    Quit date: 08/16/1998  . Smokeless tobacco: Never Used  . Alcohol use No  . Drug use: No  . Sexual activity: Yes   Other Topics Concern  . None   Social History Narrative   Occupation: 2nd shift   Married.           Family History:  The patient's family history includes Arthritis in his father; CAD in his maternal aunt and mother; Hypertension in his father and mother.   ROS:   Please see the history of present illness.    ROS All other systems reviewed and are negative.   PHYSICAL EXAM:   VS:  BP 128/78   Pulse 72   Ht 5\' 9"  (1.753 m)   Wt 249 lb (112.9 kg)   BMI 36.77 kg/m    GEN: Well nourished, well developed, in no acute distress  HEENT: normal  Neck: no JVD, carotid bruits, or masses Cardiac: RRR; no murmurs, rubs, or gallops,no edema  Respiratory:  clear to auscultation bilaterally, normal work of breathing GI: obese, soft, nontender, nondistended, + BS MS: no deformity or atrophy  Skin: warm and dry, no rash Neuro:  Alert and Oriented x 3, Strength and sensation are intact Psych: euthymic mood, full affect  Wt Readings from Last 3 Encounters:  12/07/16 249 lb (112.9 kg)  11/09/16 251 lb 3.2 oz (113.9 kg)  10/06/16 251 lb (113.9 kg)      Studies/Labs Reviewed:   EKG:  EKG is not ordered today.    Recent Labs: 07/09/2016: ALT 13; TSH 3.94 08/28/2016: BUN 10; Creatinine, Ser 0.92;  Hemoglobin 12.8; Platelets 136; Potassium 3.5; Sodium 138   Lipid Panel    Component Value Date/Time   CHOL 161 07/09/2016 1013   TRIG 96.0 07/09/2016 1013   HDL 28.90 (L) 07/09/2016 1013   CHOLHDL 6 07/09/2016 1013   VLDL 19.2 07/09/2016 1013   LDLCALC 113 (H) 07/09/2016 1013   LDLDIRECT 199.5 07/25/2012 0935    Additional studies/ records that were reviewed today include:  Cath 08/27/2016 Conclusion     Ost LAD to Prox LAD stent, 10 %stenosed.  Dist RCA Stent (Resolute DES 2.5 mg x 14 mm), 100 %stenosed. Fills via left to right collaterals  ____________CULPRIT______________________  Mid Cx lesion, 90 %stenosed after previous stent  A STENT RESOLUTE ONYX 3.0X18 drug eluting stent was successfully placed, and overlaps previously placed stent.  Post intervention, there is a 0% residual stenosis.  __________________________________________  The left ventricular ejection fraction is 50-55% by visual estimate.  LV end diastolic pressure is moderately elevated.   Assessment difficult PCI of mid circumflex stent overlapping the previous he placed proximal stent. Chronically occluded distal RCA stent with brisk left left right collaterals. Patent LAD stent  Plan:  Overnight observation with anticipated discharge tomorrow morning.  TR band removal per protocol.  Continue aspirin plus Plavix, essentially lifelong.  He aggressively treating cardiac risk factors with statin and antihypertensives. (Beta Blocker and Calcium Channel Blocker as he is allergic to ace inhibitors)        ASSESSMENT:    1. Coronary artery disease of native artery of native heart with stable angina pectoris ([place])   2. Essential hypertension   3. Mixed hyperlipidemia      PLAN:  In order of problems listed above:  1. CAD s/p multiple PCI: Most recent DES of LCx in January. Prior stents in proximal LCx and diagonal still patent. stent in distal RCA patent but PLOM occluded. There is  still flow into the PDA. He has stable angina, improved after addition of Imdur. Doing well with lifestyle modification and Cardiac Rehab.  I think he would not be an ideal candidate for CTO PCI of the PLOM due to prior stent ? X 2.  Continue DAPT indefinitely.   2. HTN: Blood pressure well controlled after addition of Imdur.   3. HLD: Continue zetia and Lipitor. Plan checking Fasting Lipid Panel and LFT today.   4. Prediabetes: Last hemoglobin 6.2, he is not on any oral antihyperglycemic medication.   Medication Adjustments/Labs and Tests Ordered: Current medicines are reviewed at length with the patient today.  Concerns regarding medicines are outlined above.  Medication changes, Labs and Tests ordered today are listed in the Patient Instructions below. Patient Instructions  Continue your current therapy  I will see you in 6 months.  Keep working on your exercise, dietary changes, and weight loss.     Signed, Savior Himebaugh Martinique, MD  12/07/2016 5:52 PM    [facility] [place], [place], [place]  [address] Phone: [phone]; Fax: [phone]

## 2016-12-06 ENCOUNTER — Encounter (HOSPITAL_COMMUNITY): Payer: 59

## 2016-12-06 ENCOUNTER — Ambulatory Visit: Payer: 59 | Admitting: Cardiology

## 2016-12-07 ENCOUNTER — Ambulatory Visit (INDEPENDENT_AMBULATORY_CARE_PROVIDER_SITE_OTHER): Payer: 59 | Admitting: Cardiology

## 2016-12-07 ENCOUNTER — Encounter: Payer: Self-pay | Admitting: Cardiology

## 2016-12-07 VITALS — BP 128/78 | HR 72 | Ht 69.0 in | Wt 249.0 lb

## 2016-12-07 DIAGNOSIS — E782 Mixed hyperlipidemia: Secondary | ICD-10-CM

## 2016-12-07 DIAGNOSIS — I25118 Atherosclerotic heart disease of native coronary artery with other forms of angina pectoris: Secondary | ICD-10-CM | POA: Diagnosis not present

## 2016-12-07 DIAGNOSIS — I10 Essential (primary) hypertension: Secondary | ICD-10-CM | POA: Diagnosis not present

## 2016-12-07 LAB — BASIC METABOLIC PANEL
BUN: 11 mg/dL (ref 7–25)
CO2: 26 mmol/L (ref 20–31)
Calcium: 8.6 mg/dL (ref 8.6–10.3)
Chloride: 105 mmol/L (ref 98–110)
Creat: 0.96 mg/dL (ref 0.70–1.25)
Glucose, Bld: 114 mg/dL — ABNORMAL HIGH (ref 65–99)
Potassium: 4.1 mmol/L (ref 3.5–5.3)
Sodium: 140 mmol/L (ref 135–146)

## 2016-12-07 LAB — HEPATIC FUNCTION PANEL
ALT: 10 U/L (ref 9–46)
AST: 14 U/L (ref 10–35)
Albumin: 3.6 g/dL (ref 3.6–5.1)
Alkaline Phosphatase: 113 U/L (ref 40–115)
Bilirubin, Direct: 0.2 mg/dL (ref <=0.2)
Indirect Bilirubin: 0.6 mg/dL (ref 0.2–1.2)
Total Bilirubin: 0.8 mg/dL (ref 0.2–1.2)
Total Protein: 7 g/dL (ref 6.1–8.1)

## 2016-12-07 LAB — LIPID PANEL
Cholesterol: 123 mg/dL (ref <200)
HDL: 26 mg/dL — ABNORMAL LOW (ref >40)
LDL Cholesterol: 79 mg/dL (ref <100)
Total CHOL/HDL Ratio: 4.7 Ratio (ref <5.0)
Triglycerides: 92 mg/dL (ref <150)
VLDL: 18 mg/dL (ref <30)

## 2016-12-07 NOTE — Patient Instructions (Signed)
Continue your current therapy  I will see you in 6 months.  Keep working on your exercise, dietary changes, and weight loss.

## 2016-12-08 ENCOUNTER — Encounter (HOSPITAL_COMMUNITY): Payer: 59

## 2016-12-09 ENCOUNTER — Telehealth: Payer: Self-pay | Admitting: Cardiology

## 2016-12-09 NOTE — Telephone Encounter (Signed)
New message    Pt is calling to get his results from his labs.

## 2016-12-09 NOTE — Telephone Encounter (Signed)
Pt notified of results

## 2016-12-10 ENCOUNTER — Encounter (HOSPITAL_COMMUNITY): Payer: 59

## 2016-12-13 ENCOUNTER — Encounter (HOSPITAL_COMMUNITY): Payer: 59

## 2016-12-15 ENCOUNTER — Encounter (HOSPITAL_COMMUNITY): Payer: 59

## 2016-12-17 ENCOUNTER — Encounter (HOSPITAL_COMMUNITY): Payer: 59

## 2016-12-20 ENCOUNTER — Encounter (HOSPITAL_COMMUNITY): Payer: 59

## 2016-12-22 ENCOUNTER — Encounter (HOSPITAL_COMMUNITY): Payer: 59

## 2016-12-24 ENCOUNTER — Encounter (HOSPITAL_COMMUNITY): Payer: 59

## 2016-12-27 ENCOUNTER — Encounter (HOSPITAL_COMMUNITY): Payer: 59

## 2016-12-29 ENCOUNTER — Encounter (HOSPITAL_COMMUNITY): Payer: 59

## 2016-12-31 ENCOUNTER — Encounter (HOSPITAL_COMMUNITY): Payer: 59

## 2017-01-03 ENCOUNTER — Encounter (HOSPITAL_COMMUNITY): Payer: 59

## 2017-01-05 ENCOUNTER — Encounter (HOSPITAL_COMMUNITY): Payer: 59

## 2017-01-07 ENCOUNTER — Encounter (HOSPITAL_COMMUNITY): Payer: 59

## 2017-01-10 ENCOUNTER — Encounter (HOSPITAL_COMMUNITY): Payer: 59

## 2017-01-10 ENCOUNTER — Telehealth: Payer: Self-pay | Admitting: Cardiology

## 2017-01-10 NOTE — Telephone Encounter (Signed)
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 He needs letter to state no lifting over a certain number that Martinique feels reasonable,(maybe 25lbs per pt) and no climbing ladders-pls call cell when ready and will pick up

## 2017-01-10 NOTE — Telephone Encounter (Signed)
OK to give letter. No lifting > 25 lbs and no climbing ladders seems reasonable.  Naszir Cott Martinique MD, [facility]

## 2017-01-10 NOTE — Telephone Encounter (Signed)
Routed to MD

## 2017-01-11 NOTE — Telephone Encounter (Signed)
Called patient no answer.LMTC. 

## 2017-01-12 ENCOUNTER — Encounter (HOSPITAL_COMMUNITY): Payer: 59

## 2017-01-12 NOTE — Telephone Encounter (Signed)
Returned call to patient 01/11/17.Return to work letter left at front desk for pick up.

## 2017-01-14 ENCOUNTER — Encounter (HOSPITAL_COMMUNITY): Payer: 59

## 2017-01-17 ENCOUNTER — Encounter (HOSPITAL_COMMUNITY): Payer: 59

## 2017-01-17 ENCOUNTER — Other Ambulatory Visit: Payer: Self-pay | Admitting: Student

## 2017-01-17 DIAGNOSIS — Z9861 Coronary angioplasty status: Principal | ICD-10-CM

## 2017-01-17 DIAGNOSIS — I2510 Atherosclerotic heart disease of native coronary artery without angina pectoris: Secondary | ICD-10-CM

## 2017-01-19 ENCOUNTER — Encounter (HOSPITAL_COMMUNITY): Payer: 59

## 2017-01-21 ENCOUNTER — Encounter (HOSPITAL_COMMUNITY): Payer: 59

## 2017-01-24 ENCOUNTER — Encounter (HOSPITAL_COMMUNITY): Payer: 59

## 2017-02-07 ENCOUNTER — Other Ambulatory Visit: Payer: Self-pay | Admitting: Cardiology

## 2017-02-07 MED ORDER — ATORVASTATIN CALCIUM 80 MG PO TABS: 80.0000 mg | ORAL_TABLET | Freq: Every day | ORAL | 3 refills | Status: DC

## 2017-02-07 NOTE — Telephone Encounter (Signed)
Rx(s) sent to pharmacy electronically.  

## 2017-02-07 NOTE — Telephone Encounter (Signed)
°*  STAT* If patient is at the pharmacy, call can be transferred to refill team.   1. Which medications need to be refilled? (please list name of each medication and dose if known) atorvastatin 80 mg  2. Which pharmacy/location (including street and city if local pharmacy) is medication to be sent to?[facility] [phone] - [place], [place] [address]  3. Do they need a 30 day or 90 day supply?

## 2017-02-09 ENCOUNTER — Other Ambulatory Visit: Payer: Self-pay | Admitting: Student

## 2017-02-09 DIAGNOSIS — Z9861 Coronary angioplasty status: Principal | ICD-10-CM

## 2017-02-09 DIAGNOSIS — I2510 Atherosclerotic heart disease of native coronary artery without angina pectoris: Secondary | ICD-10-CM

## 2017-02-10 NOTE — Telephone Encounter (Signed)
This is Dr. Jordan's pt. °

## 2017-04-05 ENCOUNTER — Other Ambulatory Visit: Payer: Self-pay | Admitting: Student

## 2017-04-12 ENCOUNTER — Other Ambulatory Visit: Payer: Self-pay | Admitting: *Deleted

## 2017-04-12 MED ORDER — ISOSORBIDE MONONITRATE ER 30 MG PO TB24: 30.0000 mg | ORAL_TABLET | Freq: Every day | ORAL | 0 refills | Status: DC

## 2017-06-09 ENCOUNTER — Ambulatory Visit (INDEPENDENT_AMBULATORY_CARE_PROVIDER_SITE_OTHER): Payer: 59 | Admitting: Physician Assistant

## 2017-06-09 ENCOUNTER — Encounter: Payer: Self-pay | Admitting: Physician Assistant

## 2017-06-09 VITALS — BP 148/82 | HR 75 | Ht 69.0 in | Wt 252.0 lb

## 2017-06-09 DIAGNOSIS — R7303 Prediabetes: Secondary | ICD-10-CM | POA: Diagnosis not present

## 2017-06-09 DIAGNOSIS — Z9861 Coronary angioplasty status: Secondary | ICD-10-CM | POA: Diagnosis not present

## 2017-06-09 DIAGNOSIS — I10 Essential (primary) hypertension: Secondary | ICD-10-CM | POA: Diagnosis not present

## 2017-06-09 DIAGNOSIS — E785 Hyperlipidemia, unspecified: Secondary | ICD-10-CM | POA: Diagnosis not present

## 2017-06-09 DIAGNOSIS — Z8679 Personal history of other diseases of the circulatory system: Secondary | ICD-10-CM

## 2017-06-09 DIAGNOSIS — I2510 Atherosclerotic heart disease of native coronary artery without angina pectoris: Secondary | ICD-10-CM | POA: Diagnosis not present

## 2017-06-09 MED ORDER — ISOSORBIDE MONONITRATE ER 60 MG PO TB24: 60.0000 mg | ORAL_TABLET | Freq: Every day | ORAL | 6 refills | Status: DC

## 2017-06-09 NOTE — Progress Notes (Signed)
Cardiology Office Note    Date:  06/11/2017   ID:  ALFIE RIDEAUX, DOB [birth year], MRN [id number]  PCP:  Cassandria Anger, MD  Cardiologist:  Dr. Martinique   Chief Complaint  Patient presents with  . Follow-up    seen for Dr. Martinique    History of Present Illness:  Anthony Chapman is a 66 y.o. male with PMH of CAD (s/p DES to RCA in 2010, DES to RCA and LCx in 2015, DES to LCx 08/2016), HTN, HLD, prediabetes and chronic LBBB. Cardiac catheterization performed in January 2018 showed 100% ISR of distal RCA with left-to-right collaterals, 10% ostial LAD in-stent restenosis, 90% mid left circumflex stenosis treated with Resolute DES. He was last seen by Dr. Martinique on 12/07/2016, he was doing well at the time. The plan is to continue DAPT indefinitely. Present lipid panel obtained on that day showed cholesterol 123, HDL 26, LDL 79, triglyceride 92.  Patient presents today for cardiology office visit. Recently, he has been established with the [facility]. His hydralazine, Lipitor and Zetia were discontinued. He was switched to Crestor 20 mg daily. I will increase Crestor to 40 mg daily. His blood pressure is mildly elevated today, I will increase his Imdur to 60 mg daily. He still occasionally notice some chest discomfort but only when he does extreme amount of activity. It is quite stable at this point. He does not have any chest pain with regular activity. He has no lower extremity edema, orthopnea or PND.   Past Medical History:  Diagnosis Date  . Anginal pain ([place])   . Arthritis    "all over" (08/27/2016)  . CAD (coronary artery disease)    a.  stent placement to the RCA in 2010 b. cath in 2015 with PCI to the RCA (2.47mm x65mm Resolute DES) and LCx (3.41mm x44mm Resolute DES).  c. 08/2016: cath showing 100% ISR of d-RCA with collaterals, 10% Ost LAD ISR, 90% mid-Cx stenosis (3.0x24mm Resolute DES placed)  . ED (erectile dysfunction)   . GERD (gastroesophageal reflux disease)   . HTN  (hypertension)   . Hyperlipemia   . Myocardial infarction ([place]) 2010; 2015    Past Surgical History:  Procedure Laterality Date  . CARDIAC CATHETERIZATION  09/2013  . CARDIAC CATHETERIZATION N/A 08/27/2016   Procedure: Left Heart Cath and Coronary Angiography;  Surgeon: Leonie Man, MD;  Location: [place] [facility];  Service: Cardiovascular;  Laterality: N/A;  . CARDIAC CATHETERIZATION N/A 08/27/2016   Procedure: Coronary Stent Intervention;  Surgeon: Leonie Man, MD;  Location: [place] [facility];  Service: Cardiovascular;  Laterality: N/A;  . CORONARY ANGIOPLASTY WITH STENT PLACEMENT  2010; 1/192018  . HERNIA REPAIR    . INGUINAL HERNIA REPAIR Left   . UMBILICAL HERNIA REPAIR      Current Medications: Outpatient Medications Prior to Visit  Medication Sig Dispense Refill  . amLODipine (NORVASC) 10 MG tablet Take 1 tablet (10 mg total) by mouth daily. 90 tablet 3  . aspirin 81 MG EC tablet Take 1 tablet (81 mg total) by mouth daily. 30 tablet 0  . carvedilol (COREG) 25 MG tablet Take 1 tablet (25 mg total) by mouth 2 (two) times daily with a meal. 180 tablet 3  . clopidogrel (PLAVIX) 75 MG tablet Take 75 mg by mouth daily.    . nitroGLYCERIN (NITROSTAT) 0.4 MG SL tablet Place 1 tablet (0.4 mg total) under the tongue every 5 (five) minutes as needed for chest pain.  25 tablet 12  . potassium chloride (KLOR-CON) 8 MEQ tablet Take 1 tablet (8 mEq total) by mouth daily. 30 tablet 0  . isosorbide mononitrate (IMDUR) 30 MG 24 hr tablet TAKE 1 TABLET(30 MG) BY MOUTH DAILY 30 tablet 6  . atorvastatin (LIPITOR) 80 MG tablet Take 1 tablet (80 mg total) by mouth daily. (Patient not taking: Reported on 06/09/2017) 90 tablet 3  . cholecalciferol (VITAMIN D) 1000 units tablet Take 1,000 Units by mouth every other day.    . ezetimibe (ZETIA) 10 MG tablet TAKE 1 TABLET BY MOUTH EVERY DAY (Patient not taking: Reported on 06/09/2017) 30 tablet 6  . hydrALAZINE (APRESOLINE) 25 MG tablet TAKE 1 TABLET  BY MOUTH THREE TIMES DAILY (Patient not taking: Reported on 06/09/2017) 270 tablet 1  . isosorbide mononitrate (IMDUR) 30 MG 24 hr tablet Take 1-1.5 tablets (30-45 mg total) by mouth daily. Take 1.5 tablets (45 mg total) by mouth on Mondays, Wednesdays, and Thursdays. (Patient not taking: Reported on 06/09/2017) 30 tablet 0  . tetrahydrozoline 0.05 % ophthalmic solution Place 1-2 drops into both eyes 3 (three) times daily as needed (for dry/irritated eyes).     No facility-administered medications prior to visit.      Allergies:   Losartan   Social History   Social History  . Marital status: Married    Spouse name: N/A  . Number of children: N/A  . 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 Smoking status: Former Smoker    Packs/day: 1.00    Years: 30.00    Types: Cigarettes    Quit date: 08/16/1998  . Smokeless tobacco: Never Used  . Alcohol use No  . Drug use: No  . Sexual activity: Yes   Other Topics Concern  . None   Social History Narrative   Occupation: 2nd shift   Married.           Family History:  The patient's family history includes Arthritis in his father; CAD in his maternal aunt and mother; Hypertension in his father, mother, and unknown relative.   ROS:   Please see the history of present illness.    ROS All other systems reviewed and are negative.   PHYSICAL EXAM:   VS:  BP (!) 148/82   Pulse 75   Ht 5\' 9"  (1.753 m)   Wt 252 lb (114.3 kg)   BMI 37.21 kg/m    GEN: Well nourished, well developed, in no acute distress  HEENT: normal  Neck: no JVD, carotid bruits, or masses Cardiac: RRR; no murmurs, rubs, or gallops,no edema  Respiratory:  clear to auscultation bilaterally, normal work of breathing GI: soft, nontender, nondistended, + BS MS: no deformity or atrophy  Skin: warm and dry, no rash Neuro:  Alert and Oriented x 3, Strength and sensation are intact Psych: euthymic mood, full  affect  Wt Readings from Last 3 Encounters:  06/09/17 252 lb (114.3 kg)  12/07/16 249 lb (112.9 kg)  11/09/16 251 lb 3.2 oz (113.9 kg)      Studies/Labs Reviewed:   EKG:  EKG is ordered today.  The ekg ordered today demonstrates Normal sinus rhythm with left bundle branch block  Recent Labs: 07/09/2016: TSH 3.94 08/28/2016: Hemoglobin 12.8; Platelets 136 12/07/2016: ALT 10; BUN 11; Creat 0.96; Potassium 4.1; Sodium 140   Lipid Panel    Component Value Date/Time   CHOL 123 12/07/2016 1516   TRIG  92 12/07/2016 1516   HDL 26 (L) 12/07/2016 1516   CHOLHDL 4.7 12/07/2016 1516   VLDL 18 12/07/2016 1516   LDLCALC 79 12/07/2016 1516   LDLDIRECT 199.5 07/25/2012 0935    Additional studies/ records that were reviewed today include:   Cath 08/27/2016 Conclusion     Ost LAD to Prox LAD stent, 10 %stenosed.  Dist RCA Stent (Resolute DES 2.5 mg x 14 mm), 100 %stenosed. Fills via left to right collaterals  ____________CULPRIT______________________  Mid Cx lesion, 90 %stenosed after previous stent  A STENT RESOLUTE ONYX 3.0X18 drug eluting stent was successfully placed, and overlaps previously placed stent.  Post intervention, there is a 0% residual stenosis.  __________________________________________  The left ventricular ejection fraction is 50-55% by visual estimate.  LV end diastolic pressure is moderately elevated.   Assessment difficult PCI of mid circumflex stent overlapping the previous he placed proximal stent. Chronically occluded distal RCA stent with brisk left left right collaterals. Patent LAD stent  Plan:  Overnight observation with anticipated discharge tomorrow morning.  TR band removal per protocol.  Continue aspirin plus Plavix, essentially lifelong.  He aggressively treating cardiac risk factors with statin and antihypertensives. (Beta Blocker and Calcium Channel Blocker as he is allergic to ace inhibitors)      ASSESSMENT:    1. CAD S/P  percutaneous coronary angioplasty - 2015 PCI to RCA (2.5 x 14 Resolute DES) & Cx (3 x 12 Resolute DES)   2. Essential hypertension   3. Hyperlipidemia, unspecified hyperlipidemia type   4. Prediabetes   5. H/O left bundle branch block      PLAN:  In order of problems listed above:  1. CAD: Underwent DES to the left circumflex on 08/27/2016. Denying any significant chest pain with regular activity, however does notice occasional chest discomfort with extreme activity.   2. Hypertension: Blood pressure mildly elevated today, however he did not take his blood pressure medication yet. Normally it runs very well.  3. Hyperlipidemia: Last lipid panel back in May 2018 showed very well-controlled cholesterol, triglyceride, LDL 79, HDL 26. Recommend diet and exercise to further improve  4. Prediabetes: We'll defer to primary care provider    Medication Adjustments/Labs and Tests Ordered: Current medicines are reviewed at length with the patient today.  Concerns regarding medicines are outlined above.  Medication changes, Labs and Tests ordered today are listed in the Patient Instructions below. Patient Instructions  Medication Instructions:  INCREASE Imdur to 60 mg take 1 tablet once a day (if you still have some 30 mg tablets you may take 2 tablets twice a day until they run out)  INCREASE Crestor to 40mg  Take 1 tablet once a day  Labwork: Your physician recommends that you return for lab work in: 6-8 FASTING LIPID PANEL AND LFT  Testing/Procedures: None   Follow-Up: Your physician wants you to follow-up in: 6 MONTHS WITH DR Martinique ONLY You will receive a reminder letter in the mail two months in advance. If you don't receive a letter, please call our office to schedule the follow-up appointment.  Any Other Special Instructions Will Be Listed Below (If Applicable).  If you need a refill on your cardiac medications before your next appointment, please call your pharmacy.      Hilbert Corrigan, [place]  06/11/2017 8:45 AM    [facility] [address], [place], [place]  [address] Phone: [phone]; Fax: [phone]

## 2017-06-09 NOTE — Patient Instructions (Addendum)
Medication Instructions:  INCREASE Imdur to 60 mg take 1 tablet once a day (if you still have some 30 mg tablets you may take 2 tablets twice a day until they run out)  INCREASE Crestor to 40mg  Take 1 tablet once a day  Labwork: Your physician recommends that you return for lab work in: 6-8 FASTING LIPID PANEL AND LFT  Testing/Procedures: None   Follow-Up: Your physician wants you to follow-up in: 6 MONTHS WITH DR Martinique ONLY You will receive a reminder letter in the mail two months in advance. If you don't receive a letter, please call our office to schedule the follow-up appointment.  Any Other Special Instructions Will Be Listed Below (If Applicable).  If you need a refill on your cardiac medications before your next appointment, please call your pharmacy.

## 2017-06-11 ENCOUNTER — Encounter: Payer: Self-pay | Admitting: Physician Assistant

## 2017-06-21 NOTE — Addendum Note (Signed)
Addended by: Therisa Doyne on: 06/21/2017 04:09 PM   Modules accepted: Orders

## 2017-06-28 ENCOUNTER — Telehealth: Payer: Self-pay | Admitting: Cardiology

## 2017-06-28 MED ORDER — ROSUVASTATIN CALCIUM 40 MG PO TABS: 40.0000 mg | ORAL_TABLET | Freq: Every day | ORAL | 5 refills | Status: DC

## 2017-06-28 NOTE — Telephone Encounter (Signed)
New message      *STAT* If patient is at the pharmacy, call can be transferred to refill team.   1. Which medications need to be refilled? (please list name of each medication and dose if known)  rosuvastatin (CRESTOR) 40 MG tablet Take 40 mg by mouth      2. Which pharmacy/location (including street and city if local pharmacy) is medication to be sent to? [facility] [address]  3. Do they need a 30 day or 90 day supply? [place]

## 2017-07-21 ENCOUNTER — Other Ambulatory Visit: Payer: Self-pay | Admitting: *Deleted

## 2017-07-21 DIAGNOSIS — I2510 Atherosclerotic heart disease of native coronary artery without angina pectoris: Secondary | ICD-10-CM

## 2017-07-21 DIAGNOSIS — Z9861 Coronary angioplasty status: Secondary | ICD-10-CM

## 2017-07-21 DIAGNOSIS — I10 Essential (primary) hypertension: Secondary | ICD-10-CM

## 2017-07-22 MED ORDER — CLOPIDOGREL BISULFATE 75 MG PO TABS: 75.0000 mg | ORAL_TABLET | Freq: Every day | ORAL | 0 refills | Status: DC

## 2017-07-22 MED ORDER — AMLODIPINE BESYLATE 10 MG PO TABS: 10.0000 mg | ORAL_TABLET | Freq: Every day | ORAL | 3 refills | Status: DC

## 2017-07-22 MED ORDER — POTASSIUM CHLORIDE ER 8 MEQ PO TBCR: 8.0000 meq | EXTENDED_RELEASE_TABLET | Freq: Every day | ORAL | 0 refills | Status: DC

## 2017-07-22 MED ORDER — ISOSORBIDE MONONITRATE ER 60 MG PO TB24: 60.0000 mg | ORAL_TABLET | Freq: Every day | ORAL | 6 refills | Status: DC

## 2017-07-22 NOTE — Telephone Encounter (Signed)
°*  STAT* If patient is at the pharmacy, call can be transferred to refill team.   1. Which medications need to be refilled? (please list name of each medication and dose if known) anadolodapine nitrostat copatridril clopidol potassium   2. Which pharmacy/location (including street and city if local pharmacy) is medication to be sent to? [facility] [address] [facility] [place]  3. Do they need a 30 day or 90 day supply? [place]

## 2017-07-25 ENCOUNTER — Telehealth: Payer: Self-pay | Admitting: Physician Assistant

## 2017-07-25 NOTE — Telephone Encounter (Signed)
Spoke to Anthony Chapman  Anthony Chapman states he has an appointment 07/27/17 with dentist office for 1 tooth extraction.  he states he need clearance for dental extraction as to hold clopidogrel for procedure.    Anthony Chapman states he has already stop the clopidogrel on Sat 07/23/17 in anticipation of procedures.    Anthony Chapman states he trying to complete his deductible before the end of the year.   Anthony Chapman states it is a dentist office in [place] ,he does not have the  Name."that office is suppose to be sending information.  Anthony Chapman aware will have to defer to DR Martinique

## 2017-07-25 NOTE — Telephone Encounter (Signed)
LEFT message for patient to return call to triage nurse for assistance

## 2017-07-25 NOTE — Telephone Encounter (Signed)
Returned call to patient Dr.Jordan's recommendations given. 

## 2017-07-25 NOTE — Telephone Encounter (Signed)
He does not need to stop Plavix for a single tooth extraction. Bleeding risk is very low. He is not quite a year out from his last stent. He should resume Plavix but he is OK to go ahead and have extraction done.   Peter Martinique MD, [facility]

## 2017-07-25 NOTE — Telephone Encounter (Signed)
New message   Pt is requesting surgical clearance and to hold his blood thinner. I explained nicely to the pt that he will have to have the surgeons office call us to assist with the surgical clearance.  Pt verbalized that he did not understand pt got upset and want a rn to call him to confirm that he can hold-stop taking his blood thinners

## 2017-08-19 ENCOUNTER — Telehealth: Payer: Self-pay | Admitting: Physician Assistant

## 2017-08-19 NOTE — Telephone Encounter (Signed)
Please review for refill. Thanks!  

## 2017-08-19 NOTE — Telephone Encounter (Signed)
Follow up     *STAT* If patient is at the pharmacy, call can be transferred to refill team.   1. Which medications need to be refilled? (please list name of each medication and dose if known) carvedilol (COREG) 25 MG tablet  2. Which pharmacy/location (including street and city if local pharmacy) is medication to be sent to? [facility] [phone] (Phone) 3. Do they need a 30 day or 90 day supply? [place]

## 2017-08-22 ENCOUNTER — Other Ambulatory Visit: Payer: Self-pay

## 2017-08-22 MED ORDER — CARVEDILOL 25 MG PO TABS: 25.0000 mg | ORAL_TABLET | Freq: Two times a day (BID) | ORAL | 0 refills | Status: DC

## 2017-08-22 NOTE — Telephone Encounter (Unsigned)
Copied from [place] [id number]. Topic: Quick Communication - Rx Refill/Question >> Aug 22, 2017  2:11 PM Carolyn Stare wrote: Medication carvedilol (COREG) 25 MG tablet   Has the patient contacted their pharmacy  yes    Preferred Pharmacy (with phone number or street name   [facility] [address] [facility]    Agent: Please be advised that RX refills may take up to 3 business days. We ask that you follow-up with your pharmacy.

## 2017-09-22 ENCOUNTER — Other Ambulatory Visit (HOSPITAL_COMMUNITY): Payer: Self-pay | Admitting: Internal Medicine

## 2017-09-23 ENCOUNTER — Other Ambulatory Visit: Payer: Self-pay | Admitting: *Deleted

## 2017-09-23 MED ORDER — CLOPIDOGREL BISULFATE 75 MG PO TABS: 75.0000 mg | ORAL_TABLET | Freq: Once | ORAL | 5 refills | Status: AC

## 2017-10-16 ENCOUNTER — Other Ambulatory Visit: Payer: Self-pay | Admitting: Physician Assistant

## 2017-10-17 ENCOUNTER — Other Ambulatory Visit: Payer: Self-pay

## 2017-10-17 MED ORDER — POTASSIUM CHLORIDE ER 8 MEQ PO CPCR: 8.0000 meq | ORAL_CAPSULE | Freq: Every day | ORAL | 8 refills | Status: DC

## 2017-12-07 ENCOUNTER — Telehealth: Payer: Self-pay | Admitting: Cardiology

## 2017-12-07 ENCOUNTER — Other Ambulatory Visit: Payer: Self-pay | Admitting: Cardiology

## 2017-12-07 NOTE — Telephone Encounter (Signed)
°*  STAT* If patient is at the pharmacy, call can be transferred to refill team.   1. Which medications need to be refilled? (please list name of each medication and dose if known) Carvedilol 25mg   2. Which pharmacy/location (including street and city if local pharmacy) is medication to be sent to?[facility] [place]  3. Do they need a 30 day or 90 day supply? 30day

## 2017-12-07 NOTE — Telephone Encounter (Signed)
Called patient to schedule appointment and refill medication. Left message for patient to contact office.

## 2017-12-07 NOTE — Telephone Encounter (Signed)
Pt calling to schedule 6 month office visit. Appointment schedule for 12/08/17 @ 9 am with Dr. Martinique

## 2017-12-07 NOTE — Telephone Encounter (Signed)
New Message: ° ° ° ° ° ° °Pt is returning a call °

## 2017-12-08 ENCOUNTER — Ambulatory Visit: Payer: 59 | Admitting: Cardiology

## 2017-12-08 MED ORDER — CARVEDILOL 25 MG PO TABS: 25.0000 mg | ORAL_TABLET | Freq: Two times a day (BID) | ORAL | 3 refills | Status: DC

## 2017-12-08 NOTE — Progress Notes (Deleted)
Cardiology Office Note    Date:  12/08/2017   ID:  Anthony Chapman, DOB Nov 05, 1950, MRN [id number]  PCP:  Cassandria Anger, MD  Cardiologist:  Dr. Martinique   No chief complaint on file.   History of Present Illness:  Anthony Chapman is a 67 y.o. male with PMH of CAD (s/p DES to RCA in 2010, DES to RCA and LCx in 2015, DES to LCx 08/2016), HTN, HLD, prediabetes and chronic LBBB. Cardiac catheterization performed in January 2018 showed 100% ISR of distal RCA with left-to-right collaterals, 10% ostial LAD in-stent restenosis, 90% mid left circumflex stenosis treated with Resolute DES.  The plan is to continue DAPT indefinitely.   Patient presents today for cardiology office visit. Recently, he has been established with the [facility]. His hydralazine, Lipitor and Zetia were discontinued. He was switched to Crestor 20 mg daily. I will increase Crestor to 40 mg daily. His blood pressure is mildly elevated today, I will increase his Imdur to 60 mg daily. He still occasionally notice some chest discomfort but only when he does extreme amount of activity. It is quite stable at this point. He does not have any chest pain with regular activity. He has no lower extremity edema, orthopnea or PND.   Past Medical History:  Diagnosis Date  . Anginal pain ([place])   . Arthritis    "all over" (08/27/2016)  . CAD (coronary artery disease)    a.  stent placement to the RCA in 2010 b. cath in 2015 with PCI to the RCA (2.54mm x71mm Resolute DES) and LCx (3.63mm x22mm Resolute DES).  c. 08/2016: cath showing 100% ISR of d-RCA with collaterals, 10% Ost LAD ISR, 90% mid-Cx stenosis (3.0x43mm Resolute DES placed)  . ED (erectile dysfunction)   . GERD (gastroesophageal reflux disease)   . HTN (hypertension)   . Hyperlipemia   . Myocardial infarction ([place]) 2010; 2015    Past Surgical History:  Procedure Laterality Date  . CARDIAC CATHETERIZATION  09/2013  . CARDIAC CATHETERIZATION N/A 08/27/2016   Procedure:  Left Heart Cath and Coronary Angiography;  Surgeon: Leonie Man, MD;  Location: [place] [facility];  Service: Cardiovascular;  Laterality: N/A;  . CARDIAC CATHETERIZATION N/A 08/27/2016   Procedure: Coronary Stent Intervention;  Surgeon: Leonie Man, MD;  Location: [place] [facility];  Service: Cardiovascular;  Laterality: N/A;  . CORONARY ANGIOPLASTY WITH STENT PLACEMENT  2010; 1/192018  . HERNIA REPAIR    . INGUINAL HERNIA REPAIR Left   . UMBILICAL HERNIA REPAIR      Current Medications: Outpatient Medications Prior to Visit  Medication Sig Dispense Refill  . amLODipine (NORVASC) 10 MG tablet Take 1 tablet (10 mg total) by mouth daily. 90 tablet 3  . aspirin 81 MG EC tablet Take 1 tablet (81 mg total) by mouth daily. 30 tablet 0  . carvedilol (COREG) 25 MG tablet Take 1 tablet (25 mg total) by mouth 2 (two) times daily with a meal. --patient needs to schedule office visit with dr plotnikov 60 tablet 0  . isosorbide mononitrate (IMDUR) 60 MG 24 hr tablet Take 1 tablet (60 mg total) by mouth daily. 30 tablet 6  . nitroGLYCERIN (NITROSTAT) 0.4 MG SL tablet PLACE 1 TABLET UNDER THE TONGUE EVERY 5 MINUTES AS NEEDED FOR CHEST PAIN 25 tablet 3  . Potassium Chloride CR (MICRO-K) 8 MEQ CPCR capsule CR Take 1 capsule (8 mEq total) by mouth daily. 30 capsule 8  . rosuvastatin (CRESTOR)  40 MG tablet Take 1 tablet (40 mg total) by mouth daily. 30 tablet 5   No facility-administered medications prior to visit.      Allergies:   Losartan   Social History   Socioeconomic History  . Marital status: Married    Spouse name: Not on file  . Number of children: Not on file  . Years of education: Not on file  . Highest education level: Not on file  Occupational History  . Occupation: 2nd Shift  Social Needs  . Financial resource strain: Not on file  . Food insecurity:    Worry: Not on file    Inability: Not on file  . Transportation needs:    Medical: Not on file    Non-medical: Not on  file  Tobacco Use  . Smoking status: Former Smoker    Packs/day: 1.00    Years: 30.00    Pack years: 30.00    Types: Cigarettes    Last attempt to quit: 08/16/1998    Years since quitting: 19.3  . Smokeless tobacco: Never Used  Substance and Sexual Activity  . Alcohol use: No  . Drug use: No  . Sexual activity: Yes  Lifestyle  . Physical activity:    Days per week: Not on file    Minutes per session: Not on file  . Stress: Not on file  Relationships  . Social connections:    Talks on phone: Not on file    Gets together: Not on file    Attends religious service: Not on file    Active member of club or organization: Not on file    Attends meetings of clubs or organizations: Not on file    Relationship status: Not on file  Other Topics Concern  . Not on file  Social History Narrative   Occupation: 2nd shift   Married.           Family History:  The patient's family history includes Arthritis in his father; CAD in his maternal aunt and mother; Hypertension in his father, mother, and unknown relative.   ROS:   Please see the history of present illness.    ROS All other systems reviewed and are negative.   PHYSICAL EXAM:   VS:  There were no vitals taken for this visit.   GEN: Well nourished, well developed, in no acute distress  HEENT: normal  Neck: no JVD, carotid bruits, or masses Cardiac: RRR; no murmurs, rubs, or gallops,no edema  Respiratory:  clear to auscultation bilaterally, normal work of breathing GI: soft, nontender, nondistended, + BS MS: no deformity or atrophy  Skin: warm and dry, no rash Neuro:  Alert and Oriented x 3, Strength and sensation are intact Psych: euthymic mood, full affect  Wt Readings from Last 3 Encounters:  06/09/17 252 lb (114.3 kg)  12/07/16 249 lb (112.9 kg)  11/09/16 251 lb 3.2 oz (113.9 kg)      Studies/Labs Reviewed:   EKG:  EKG is ordered today.  The ekg ordered today demonstrates Normal sinus rhythm with left bundle  branch block  Recent Labs: No results found for requested labs within last 8760 hours.   Lipid Panel    Component Value Date/Time   CHOL 123 12/07/2016 1516   TRIG 92 12/07/2016 1516   HDL 26 (L) 12/07/2016 1516   CHOLHDL 4.7 12/07/2016 1516   VLDL 18 12/07/2016 1516   LDLCALC 79 12/07/2016 1516   LDLDIRECT 199.5 07/25/2012 0935    Additional studies/ records  that were reviewed today include:   Cath 08/27/2016 Conclusion     Ost LAD to Prox LAD stent, 10 %stenosed.  Dist RCA Stent (Resolute DES 2.5 mg x 14 mm), 100 %stenosed. Fills via left to right collaterals  ____________CULPRIT______________________  Mid Cx lesion, 90 %stenosed after previous stent  A STENT RESOLUTE ONYX 3.0X18 drug eluting stent was successfully placed, and overlaps previously placed stent.  Post intervention, there is a 0% residual stenosis.  __________________________________________  The left ventricular ejection fraction is 50-55% by visual estimate.  LV end diastolic pressure is moderately elevated.   Assessment difficult PCI of mid circumflex stent overlapping the previous he placed proximal stent. Chronically occluded distal RCA stent with brisk left left right collaterals. Patent LAD stent  Plan:  Overnight observation with anticipated discharge tomorrow morning.  TR band removal per protocol.  Continue aspirin plus Plavix, essentially lifelong.  He aggressively treating cardiac risk factors with statin and antihypertensives. (Beta Blocker and Calcium Channel Blocker as he is allergic to ace inhibitors)      ASSESSMENT:    No diagnosis found.   PLAN:  In order of problems listed above:  1. CAD: Underwent DES to the left circumflex on 08/27/2016. Denying any significant chest pain with regular activity, however does notice occasional chest discomfort with extreme activity.   2. Hypertension: Blood pressure mildly elevated today, however he did not take his blood  pressure medication yet. Normally it runs very well.  3. Hyperlipidemia: Last lipid panel back in May 2018 showed very well-controlled cholesterol, triglyceride, LDL 79, HDL 26. Recommend diet and exercise to further improve  4. Prediabetes: We'll defer to primary care provider    Medication Adjustments/Labs and Tests Ordered: Current medicines are reviewed at length with the patient today.  Concerns regarding medicines are outlined above.  Medication changes, Labs and Tests ordered today are listed in the Patient Instructions below. There are no Patient Instructions on file for this visit.   Signed, Aziz Slape Martinique, MD  12/08/2017 8:44 AM    [facility] [address], [place], [place]  [address] Phone: [phone]; Fax: [phone]

## 2017-12-09 ENCOUNTER — Encounter: Payer: Self-pay | Admitting: *Deleted

## 2018-03-06 ENCOUNTER — Other Ambulatory Visit: Payer: Self-pay | Admitting: Cardiology

## 2018-04-02 NOTE — Progress Notes (Deleted)
Cardiology Office Note    Date:  04/02/2018   ID:  Anthony Chapman, DOB 1951-06-11, MRN [id number]  PCP:  Cassandria Anger, MD  Cardiologist:  Dr. Peter Martinique   No chief complaint on file.   History of Present Illness:  Anthony Chapman is a 67 y.o. male is seen for follow up CAD. He has a  PMH of CAD (s/p DES to RCA in 2010, DES to RCA and LCx in 2015, DES to LCx 08/2016), HTN, HLD, prediabetes and chronic LBBB. Cardiac catheterization performed in January 2018 showed 100% ISR of distal RCA with left-to-right collaterals, 10% ostial LAD in-stent restenosis, 90% mid left circumflex stenosis treated with Resolute DES. The plan is to continue DAPT indefinitely.   He has been established with the [facility]. His hydralazine, Lipitor and Zetia were discontinued. He was switched to Crestor 20 mg daily. I will increase Crestor to 40 mg daily. His blood pressure is mildly elevated today, I will increase his Imdur to 60 mg daily. He still occasionally notice some chest discomfort but only when he does extreme amount of activity. It is quite stable at this point. He does not have any chest pain with regular activity. He has no lower extremity edema, orthopnea or PND.   Past Medical History:  Diagnosis Date  . Anginal pain ([place])   . Arthritis    "all over" (08/27/2016)  . CAD (coronary artery disease)    a.  stent placement to the RCA in 2010 b. cath in 2015 with PCI to the RCA (2.51mm x80mm Resolute DES) and LCx (3.46mm x23mm Resolute DES).  c. 08/2016: cath showing 100% ISR of d-RCA with collaterals, 10% Ost LAD ISR, 90% mid-Cx stenosis (3.0x67mm Resolute DES placed)  . ED (erectile dysfunction)   . GERD (gastroesophageal reflux disease)   . HTN (hypertension)   . Hyperlipemia   . Myocardial infarction ([place]) 2010; 2015    Past Surgical History:  Procedure Laterality Date  . CARDIAC CATHETERIZATION  09/2013  . CARDIAC CATHETERIZATION N/A 08/27/2016   Procedure: Left Heart Cath and  Coronary Angiography;  Surgeon: Leonie Man, MD;  Location: [place] [facility];  Service: Cardiovascular;  Laterality: N/A;  . CARDIAC CATHETERIZATION N/A 08/27/2016   Procedure: Coronary Stent Intervention;  Surgeon: Leonie Man, MD;  Location: [place] [facility];  Service: Cardiovascular;  Laterality: N/A;  . CORONARY ANGIOPLASTY WITH STENT PLACEMENT  2010; 1/192018  . HERNIA REPAIR    . INGUINAL HERNIA REPAIR Left   . UMBILICAL HERNIA REPAIR      Current Medications: Outpatient Medications Prior to Visit  Medication Sig Dispense Refill  . amLODipine (NORVASC) 10 MG tablet Take 1 tablet (10 mg total) by mouth daily. 90 tablet 3  . aspirin 81 MG EC tablet Take 1 tablet (81 mg total) by mouth daily. 30 tablet 0  . carvedilol (COREG) 25 MG tablet Take 1 tablet (25 mg total) by mouth 2 (two) times daily with a meal. 60 tablet 3  . isosorbide mononitrate (IMDUR) 60 MG 24 hr tablet Take 1 tablet (60 mg total) by mouth daily. 30 tablet 6  . nitroGLYCERIN (NITROSTAT) 0.4 MG SL tablet PLACE 1 TABLET UNDER THE TONGUE EVERY 5 MINUTES AS NEEDED FOR CHEST PAIN 25 tablet 3  . Potassium Chloride CR (MICRO-K) 8 MEQ CPCR capsule CR Take 1 capsule (8 mEq total) by mouth daily. 30 capsule 8  . rosuvastatin (CRESTOR) 40 MG tablet Take 1 tablet (40 mg  total) by mouth daily. KEEP OV. 30 tablet 1   No facility-administered medications prior to visit.      Allergies:   Losartan   Social History   Socioeconomic History  . Marital status: Married    Spouse name: Not on file  . Number of children: Not on file  . Years of education: Not on file  . Highest education level: Not on file  Occupational History  . Occupation: 2nd Shift  Social Needs  . Financial resource strain: Not on file  . Food insecurity:    Worry: Not on file    Inability: Not on file  . Transportation needs:    Medical: Not on file    Non-medical: Not on file  Tobacco Use  . Smoking status: Former Smoker    Packs/day: 1.00     Years: 30.00    Pack years: 30.00    Types: Cigarettes    Last attempt to quit: 08/16/1998    Years since quitting: 19.6  . Smokeless tobacco: Never Used  Substance and Sexual Activity  . Alcohol use: No  . Drug use: No  . Sexual activity: Yes  Lifestyle  . Physical activity:    Days per week: Not on file    Minutes per session: Not on file  . Stress: Not on file  Relationships  . Social connections:    Talks on phone: Not on file    Gets together: Not on file    Attends religious service: Not on file    Active member of club or organization: Not on file    Attends meetings of clubs or organizations: Not on file    Relationship status: Not on file  Other Topics Concern  . Not on file  Social History Narrative   Occupation: 2nd shift   Married.           Family History:  The patient's family history includes Arthritis in his father; CAD in his maternal aunt and mother; Hypertension in his father, mother, and unknown relative.   ROS:   Please see the history of present illness.    ROS All other systems reviewed and are negative.   PHYSICAL EXAM:   VS:  There were no vitals taken for this visit.   GEN: Well nourished, well developed, in no acute distress  HEENT: normal  Neck: no JVD, carotid bruits, or masses Cardiac: RRR; no murmurs, rubs, or gallops,no edema  Respiratory:  clear to auscultation bilaterally, normal work of breathing GI: soft, nontender, nondistended, + BS MS: no deformity or atrophy  Skin: warm and dry, no rash Neuro:  Alert and Oriented x 3, Strength and sensation are intact Psych: euthymic mood, full affect  Wt Readings from Last 3 Encounters:  06/09/17 252 lb (114.3 kg)  12/07/16 249 lb (112.9 kg)  11/09/16 251 lb 3.2 oz (113.9 kg)      Studies/Labs Reviewed:   EKG:  EKG is ordered today.  The ekg ordered today demonstrates Normal sinus rhythm with left bundle branch block  Recent Labs: No results found for requested labs within last  8760 hours.   Lipid Panel    Component Value Date/Time   CHOL 123 12/07/2016 1516   TRIG 92 12/07/2016 1516   HDL 26 (L) 12/07/2016 1516   CHOLHDL 4.7 12/07/2016 1516   VLDL 18 12/07/2016 1516   LDLCALC 79 12/07/2016 1516   LDLDIRECT 199.5 07/25/2012 0935    Additional studies/ records that were reviewed today include:  Cath 08/27/2016 Conclusion     Ost LAD to Prox LAD stent, 10 %stenosed.  Dist RCA Stent (Resolute DES 2.5 mg x 14 mm), 100 %stenosed. Fills via left to right collaterals  ____________CULPRIT______________________  Mid Cx lesion, 90 %stenosed after previous stent  A STENT RESOLUTE ONYX 3.0X18 drug eluting stent was successfully placed, and overlaps previously placed stent.  Post intervention, there is a 0% residual stenosis.  __________________________________________  The left ventricular ejection fraction is 50-55% by visual estimate.  LV end diastolic pressure is moderately elevated.   Assessment difficult PCI of mid circumflex stent overlapping the previous he placed proximal stent. Chronically occluded distal RCA stent with brisk left left right collaterals. Patent LAD stent  Plan:  Overnight observation with anticipated discharge tomorrow morning.  TR band removal per protocol.  Continue aspirin plus Plavix, essentially lifelong.  He aggressively treating cardiac risk factors with statin and antihypertensives. (Beta Blocker and Calcium Channel Blocker as he is allergic to ace inhibitors)      ASSESSMENT:    No diagnosis found.   PLAN:  In order of problems listed above:  1. CAD: Underwent DES to the left circumflex on 08/27/2016. Denying any significant chest pain with regular activity, however does notice occasional chest discomfort with extreme activity.   2. Hypertension: Blood pressure mildly elevated today, however he did not take his blood pressure medication yet. Normally it runs very well.  3. Hyperlipidemia: Last lipid  panel back in May 2018 showed very well-controlled cholesterol, triglyceride, LDL 79, HDL 26. Recommend diet and exercise to further improve  4. Prediabetes: We'll defer to primary care provider    Medication Adjustments/Labs and Tests Ordered: Current medicines are reviewed at length with the patient today.  Concerns regarding medicines are outlined above.  Medication changes, Labs and Tests ordered today are listed in the Patient Instructions below. There are no Patient Instructions on file for this visit.   Signed, Peter Martinique, MD  04/02/2018 9:23 AM    [facility] [address], [place], [place]  [address] Phone: [phone]; Fax: [phone]

## 2018-04-07 ENCOUNTER — Ambulatory Visit: Payer: 59 | Admitting: Cardiology

## 2018-04-07 ENCOUNTER — Encounter: Payer: Self-pay | Admitting: *Deleted

## 2018-06-24 ENCOUNTER — Other Ambulatory Visit: Payer: Self-pay | Admitting: Physician Assistant

## 2018-06-24 ENCOUNTER — Other Ambulatory Visit: Payer: Self-pay | Admitting: Cardiology

## 2018-06-24 DIAGNOSIS — I2510 Atherosclerotic heart disease of native coronary artery without angina pectoris: Secondary | ICD-10-CM

## 2018-06-24 DIAGNOSIS — I10 Essential (primary) hypertension: Secondary | ICD-10-CM

## 2018-06-24 DIAGNOSIS — Z9861 Coronary angioplasty status: Secondary | ICD-10-CM

## 2018-07-31 ENCOUNTER — Other Ambulatory Visit: Payer: Self-pay | Admitting: *Deleted

## 2018-07-31 DIAGNOSIS — I2510 Atherosclerotic heart disease of native coronary artery without angina pectoris: Secondary | ICD-10-CM

## 2018-07-31 DIAGNOSIS — I10 Essential (primary) hypertension: Secondary | ICD-10-CM

## 2018-07-31 DIAGNOSIS — Z9861 Coronary angioplasty status: Secondary | ICD-10-CM

## 2018-07-31 MED ORDER — ISOSORBIDE MONONITRATE ER 60 MG PO TB24: 60.0000 mg | ORAL_TABLET | Freq: Every day | ORAL | 1 refills | Status: DC

## 2018-10-05 ENCOUNTER — Other Ambulatory Visit: Payer: Self-pay | Admitting: Cardiology

## 2018-10-20 ENCOUNTER — Other Ambulatory Visit (HOSPITAL_BASED_OUTPATIENT_CLINIC_OR_DEPARTMENT_OTHER): Payer: Self-pay | Admitting: Internal Medicine

## 2018-10-20 DIAGNOSIS — R945 Abnormal results of liver function studies: Secondary | ICD-10-CM

## 2018-10-20 DIAGNOSIS — R109 Unspecified abdominal pain: Secondary | ICD-10-CM

## 2018-11-04 ENCOUNTER — Other Ambulatory Visit: Payer: Self-pay | Admitting: Cardiology

## 2018-11-04 ENCOUNTER — Other Ambulatory Visit: Payer: Self-pay | Admitting: Physician Assistant

## 2018-11-06 ENCOUNTER — Other Ambulatory Visit: Payer: Self-pay

## 2018-11-06 ENCOUNTER — Telehealth: Payer: Self-pay | Admitting: Cardiology

## 2018-11-06 MED ORDER — CLOPIDOGREL BISULFATE 75 MG PO TABS: 75.0000 mg | ORAL_TABLET | Freq: Every day | ORAL | 1 refills | Status: DC

## 2018-11-06 NOTE — Telephone Encounter (Signed)
Rx request for Clopidogrel 75 mg faxed from [facility].  Rx refilled for 90 day with 1 refill.

## 2018-11-15 ENCOUNTER — Telehealth: Payer: Self-pay | Admitting: *Deleted

## 2018-11-15 ENCOUNTER — Encounter: Payer: Self-pay | Admitting: *Deleted

## 2018-11-15 NOTE — Telephone Encounter (Signed)
   Cardiac Questionnaire:    Since your last visit or hospitalization:    1. Have you been having new or worsening chest pain? NO   2. Have you been having new or worsening shortness of breath? NO 3. Have you been having new or worsening leg swelling, wt gain, or increase in abdominal girth (pants fitting more tightly)? NO   4. Have you had any passing out spells? NO    *A YES to any of these questions would result in the appointment being kept. *If all the answers to these questions are NO, we should indicate that given the current situation regarding the worldwide coronarvirus pandemic, at the recommendation of the CDC, we are looking to limit gatherings in our waiting area, and thus will reschedule their appointment beyond four weeks from today.   _____________   [id number] Pre-Screening Questions:  . Do you currently have a fever? NO . Have you recently travelled on a cruise, internationally, or to [place], [place], [place], [place], [place], or [place], [place] [employer])? NO . Have you been in contact with someone that is currently pending confirmation of Covid19 testing or has been confirmed to have the [place] virus? NO Are you currently experiencing fatigue or cough? NO  Spoke with patient and he is aware of his telephone visit with Dr. Martinique on Friday November 17, 2018 at 10:20 am. We  went over his medications, pharmacy, and history.

## 2018-11-16 NOTE — Progress Notes (Signed)
Virtual Visit via Telephone Note   This visit type was conducted due to national recommendations for restrictions regarding the COVID-19 Pandemic (e.g. social distancing) in an effort to limit this patient's exposure and mitigate transmission in our community.  Due to his co-morbid illnesses, this patient is at least at moderate risk for complications without adequate follow up.  This format is felt to be most appropriate for this patient at this time.  The patient did not have access to video technology/had technical difficulties with video requiring transitioning to audio format only (telephone).  All issues noted in this document were discussed and addressed.  No physical exam could be performed with this format.  Please refer to the patient's chart for his  consent to telehealth for [facility].   Evaluation Performed:  Follow-up visit  Date:  11/17/2018   ID:  Anthony Chapman, DOB Oct 10, 1950, MRN [id number]  Patient Location: Home  Provider Location: Office  PCP:  Cassandria Anger, MD  Cardiologist:  Rakesha Dalporto  [place] MD Electrophysiologist:  None   Chief Complaint:  angina  History of Present Illness:    Anthony Chapman is a 68 y.o. male who presents via audio/video conferencing for a telehealth visit today.  He has a PMH of CAD (s/p DES to RCA in 2010, DES to RCA and LCx in 2015, DES to LCx 08/2016), HTN, HLD, prediabetes and chronic LBBB. Cardiac catheterization performed in January 2018 showed 100% ISR of distal RCA with left-to-right collaterals, 10% ostial LAD in-stent restenosis, 90% mid left circumflex stenosis treated with Resolute DES. He was last seen in our office in November 2018. Marland Kitchen He is followed by the [place].   Patient reports he is generally doing well. Does note that he is using his sl Ntg a little more frequently. Has chest pain if he has to do something quickly and he is also having to help his wife out more since she just had shoulder surgery. Otherwise his angina is  about the same as it has been for the last 2 years. [facility]. His labs are followed at the [place]. He is on Zyrtec and ranitidine for allergies. No dyspnea, PND, orthopnea. Works as a Presenter, broadcasting.  The patient does not have symptoms concerning for COVID-19 infection (fever, chills, cough, or new shortness of breath).    Past Medical History:  Diagnosis Date  . Anginal pain ([place])   . Arthritis    "all over" (08/27/2016)  . CAD (coronary artery disease)    a.  stent placement to the RCA in 2010 b. cath in 2015 with PCI to the RCA (2.54mm x34mm Resolute DES) and LCx (3.69mm x104mm Resolute DES).  c. 08/2016: cath showing 100% ISR of d-RCA with collaterals, 10% Ost LAD ISR, 90% mid-Cx stenosis (3.0x15mm Resolute DES placed)  . ED (erectile dysfunction)   . GERD (gastroesophageal reflux disease)   . HTN (hypertension)   . Hyperlipemia   . Myocardial infarction ([place]) 2010; 2015   Past Surgical History:  Procedure Laterality Date  . CARDIAC CATHETERIZATION  09/2013  . CARDIAC CATHETERIZATION N/A 08/27/2016   Procedure: Left Heart Cath and Coronary Angiography;  Surgeon: Leonie Man, MD;  Location: [place] [facility];  Service: Cardiovascular;  Laterality: N/A;  . CARDIAC CATHETERIZATION N/A 08/27/2016   Procedure: Coronary Stent Intervention;  Surgeon: Leonie Man, MD;  Location: [place] [facility];  Service: Cardiovascular;  Laterality: N/A;  . CORONARY ANGIOPLASTY WITH STENT PLACEMENT  2010; 1/192018  . HERNIA REPAIR    . INGUINAL HERNIA REPAIR Left   . UMBILICAL HERNIA REPAIR       Current Meds  Medication Sig  . amLODipine (NORVASC) 10 MG tablet TAKE 1 TABLET(10 MG) BY MOUTH DAILY  . aspirin 81 MG EC tablet Take 1 tablet (81 mg total) by mouth daily.  . carvedilol (COREG) 25 MG tablet Take 1 tablet (25 mg total) by mouth 2 (two) times daily with a meal.  . cetirizine (ZYRTEC) 10 MG tablet Take 10 mg by mouth 2 (two) times daily.  . isosorbide mononitrate (IMDUR)  60 MG 24 hr tablet Take 1 tablet (60 mg total) by mouth daily. NEED OV.  . montelukast (SINGULAIR) 10 MG tablet Take 10 mg by mouth 2 (two) times daily.  . nitroGLYCERIN (NITROSTAT) 0.4 MG SL tablet PLACE 1 TABLET UNDER THE TOUNGE EVERY 5 MINUTES AS NEEDED FOR CHEST PAIN  . Potassium Chloride CR (MICRO-K) 8 MEQ CPCR capsule CR Take 1 capsule (8 mEq total) by mouth daily.  . ranitidine (ZANTAC) 150 MG capsule Take 150 mg by mouth daily.  Marland Kitchen VITAMIN D, CHOLECALCIFEROL, PO Take 1 tablet by mouth.     Allergies:   Losartan   Social History   Tobacco Use  . Smoking status: Former Smoker    Packs/day: 1.00    Years: 30.00    Pack years: 30.00    Types: Cigarettes    Last attempt to quit: 08/16/1998    Years since quitting: 20.2  . Smokeless tobacco: Never Used  Substance Use Topics  . Alcohol use: No  . Drug use: No     Family Hx: The patient's family history includes Arthritis in his father; CAD in his maternal aunt and mother; Hypertension in his father, mother, and another family member.  ROS:   Please see the history of present illness.    All other systems reviewed and are negative.   Prior CV studies:   The following studies were reviewed today:  Cardiac cath 08/27/16: Conclusion     Ost LAD to Prox LAD stent, 10 %stenosed.  Dist RCA Stent (Resolute DES 2.5 mg x 14 mm), 100 %stenosed. Fills via left to right collaterals  ____________CULPRIT______________________  Mid Cx lesion, 90 %stenosed after previous stent  A STENT RESOLUTE ONYX 3.0X18 drug eluting stent was successfully placed, and overlaps previously placed stent.  Post intervention, there is a 0% residual stenosis.  __________________________________________  The left ventricular ejection fraction is 50-55% by visual estimate.  LV end diastolic pressure is moderately elevated.   Assessment difficult PCI of mid circumflex stent overlapping the previous he placed proximal stent. Chronically occluded  distal RCA stent with brisk left left right collaterals. Patent LAD stent  Plan:  Overnight observation with anticipated discharge tomorrow morning.  TR band removal per protocol.  Continue aspirin plus Plavix, essentially lifelong.  He aggressively treating cardiac risk factors with statin and antihypertensives. (Beta Blocker and Calcium Channel Blocker as he is allergic to ace inhibitors)       Labs/Other Tests and Data Reviewed:    EKG:  No ECG reviewed.  Recent Labs: No results found for requested labs within last 8760 hours.   Recent Lipid Panel Lab Results  Component Value Date/Time   CHOL 123 12/07/2016 03:16 PM   TRIG 92 12/07/2016 03:16 PM   HDL 26 (L) 12/07/2016 03:16 PM   CHOLHDL 4.7 12/07/2016 03:16 PM   LDLCALC 79 12/07/2016 03:16 PM   LDLDIRECT  199.5 07/25/2012 09:35 AM    Wt Readings from Last 3 Encounters:  11/17/18 250 lb (113.4 kg)  06/09/17 252 lb (114.3 kg)  12/07/16 249 lb (112.9 kg)     Objective:    Vital Signs:  BP 134/86   Pulse 72   Ht 5\' 9"  (1.753 m)   Wt 250 lb (113.4 kg)   BMI 36.92 kg/m      ASSESSMENT & PLAN:    1. CAD: Multiple prior interventions with last being  DES to the left circumflex on 08/27/2016. He has class 2 angina. On therapy with ASA, Coreg, amlodipine and Imdur. No longer on Plavix. Will continue current therapy. If symptoms significantly increase in severity will reevaluate. Follow up in 6 months.   2. Hypertension: Blood pressure is generally under good control  3. Hyperlipidemia: on Crestor. Labs followed at the [place]. I asked him to send [place] a dopy.  4. Prediabetes: We'll defer to primary care provider   COVID-19 Education: The signs and symptoms of COVID-19 were discussed with the patient and how to seek care for testing (follow up with PCP or arrange E-visit).  The importance of social distancing was discussed today.  Time:   Today, I have spent 13 minutes with the patient with telehealth  technology discussing the above problems.     Medication Adjustments/Labs and Tests Ordered: Current medicines are reviewed at length with the patient today.  Concerns regarding medicines are outlined above.  Tests Ordered: No orders of the defined types were placed in this encounter.  Medication Changes: No orders of the defined types were placed in this encounter.   Disposition:  Follow up in 6 month(s)  Signed, Raschelle Wisenbaker [place], MD  11/17/2018 10:37 AM    [facility]

## 2018-11-17 ENCOUNTER — Encounter: Payer: Self-pay | Admitting: Cardiology

## 2018-11-17 ENCOUNTER — Telehealth (INDEPENDENT_AMBULATORY_CARE_PROVIDER_SITE_OTHER): Payer: 59 | Admitting: Cardiology

## 2018-11-17 VITALS — BP 134/86 | HR 72 | Ht 69.0 in | Wt 250.0 lb

## 2018-11-17 DIAGNOSIS — E785 Hyperlipidemia, unspecified: Secondary | ICD-10-CM

## 2018-11-17 DIAGNOSIS — Z9861 Coronary angioplasty status: Secondary | ICD-10-CM | POA: Diagnosis not present

## 2018-11-17 DIAGNOSIS — I2510 Atherosclerotic heart disease of native coronary artery without angina pectoris: Secondary | ICD-10-CM

## 2018-11-17 DIAGNOSIS — Z8679 Personal history of other diseases of the circulatory system: Secondary | ICD-10-CM

## 2018-11-17 DIAGNOSIS — I10 Essential (primary) hypertension: Secondary | ICD-10-CM

## 2018-11-17 MED ORDER — ROSUVASTATIN CALCIUM 40 MG PO TABS: 40.0000 mg | ORAL_TABLET | Freq: Every day | ORAL | 0 refills | Status: DC

## 2018-11-17 NOTE — Patient Instructions (Signed)
Medication Instructions:  Continue same medications If you need a refill on your cardiac medications before your next appointment, please call your pharmacy.   Lab work: None ordered   Testing/Procedures: None ordered  Follow-Up: At [facility], you and your health needs are our priority.  As part of our continuing mission to provide you with exceptional heart care, we have created designated Provider Care Teams.  These Care Teams include your primary Cardiologist (physician) and Advanced Practice Providers (APPs -  Physician Assistants and Nurse Practitioners) who all work together to provide you with the care you need, when you need it. . Schedule follow up appointment with Dr.Jordan in 6 months    Call 3 months before to schedule.     

## 2018-11-30 ENCOUNTER — Telehealth: Payer: Self-pay | Admitting: *Deleted

## 2018-11-30 ENCOUNTER — Ambulatory Visit (INDEPENDENT_AMBULATORY_CARE_PROVIDER_SITE_OTHER): Payer: 59 | Admitting: Internal Medicine

## 2018-11-30 ENCOUNTER — Encounter: Payer: Self-pay | Admitting: Internal Medicine

## 2018-11-30 DIAGNOSIS — I10 Essential (primary) hypertension: Secondary | ICD-10-CM | POA: Diagnosis not present

## 2018-11-30 DIAGNOSIS — I2510 Atherosclerotic heart disease of native coronary artery without angina pectoris: Secondary | ICD-10-CM | POA: Diagnosis not present

## 2018-11-30 DIAGNOSIS — R972 Elevated prostate specific antigen [PSA]: Secondary | ICD-10-CM

## 2018-11-30 DIAGNOSIS — R10A1 Flank pain, right side: Secondary | ICD-10-CM | POA: Insufficient documentation

## 2018-11-30 DIAGNOSIS — R109 Unspecified abdominal pain: Secondary | ICD-10-CM

## 2018-11-30 DIAGNOSIS — Z9861 Coronary angioplasty status: Secondary | ICD-10-CM

## 2018-11-30 DIAGNOSIS — R1010 Upper abdominal pain, unspecified: Secondary | ICD-10-CM

## 2018-11-30 MED ORDER — HYDROCODONE-ACETAMINOPHEN 5-325 MG PO TABS: 1.0000 | ORAL_TABLET | Freq: Four times a day (QID) | ORAL | 0 refills | Status: AC | PRN

## 2018-11-30 NOTE — Assessment & Plan Note (Signed)
PSA

## 2018-11-30 NOTE — Telephone Encounter (Signed)
Covid-19 travel screening questions  Have you traveled in the last 14 days? If yes where? No Do you now or have you had a fever in the last 14 days? No Do you have any respiratory symptoms of shortness of breath or cough now or in the last 14 days? No Do you have a medical history of Congestive Heart Failure? No Do you have a medical history of lung disease? No Do you have any family members or close contacts with diagnosed or suspected Covid-19? No      

## 2018-11-30 NOTE — Assessment & Plan Note (Signed)
No CP 

## 2018-11-30 NOTE — Progress Notes (Signed)
Virtual Visit via Telephone Note  I connected with [name] on 11/30/18 at 11:20 AM EDT by telephone and verified that I am speaking with the correct person using two identifiers.   I discussed the limitations, risks, security and privacy concerns of performing an evaluation and management service by telephone and the availability of in person appointments. I also discussed with the patient that there may be a patient responsible charge related to this service. The patient expressed understanding and agreed to proceed.   History of Present Illness:   Anthony Chapman is complaining of low back pain on the right side and right flank pain off and on for 1 week.  The pain may be sudden and severe, would bring him on his knees.  It may last for several seconds and come back later.  There is only radiation.  No chills fever.  No nausea vomiting.  She tried Tylenol and ibuprofen.  There is no urinary symptoms.  He did not see any blood in the urine. Observations/Objective:  He is in no acute distress.  He is overweight Assessment and Plan:  See plan Follow Up Instructions:    I discussed the assessment and treatment plan with the patient. The patient was provided an opportunity to ask questions and all were answered. The patient agreed with the plan and demonstrated an understanding of the instructions.   The patient was advised to call back or seek an in-person evaluation if the symptoms worsen or if the condition fails to improve as anticipated.  I provided 20 minutes of non-face-to-face time during this encounter.   Walker Kehr, MD

## 2018-11-30 NOTE — Assessment & Plan Note (Signed)
Isosorbide, hyzaar, norvasc 

## 2018-11-30 NOTE — Assessment & Plan Note (Addendum)
R ?renal colic CT renal stone protocol Labs Norco prn Go to ER if worse

## 2018-12-01 ENCOUNTER — Ambulatory Visit (INDEPENDENT_AMBULATORY_CARE_PROVIDER_SITE_OTHER)
Admission: RE | Admit: 2018-12-01 | Discharge: 2018-12-01 | Disposition: A | Discharge status: Home / Self Care | Payer: 59 | Source: Ambulatory Visit | Attending: Internal Medicine | Admitting: Internal Medicine

## 2018-12-01 ENCOUNTER — Other Ambulatory Visit: Payer: Self-pay

## 2018-12-01 DIAGNOSIS — R109 Unspecified abdominal pain: Secondary | ICD-10-CM | POA: Diagnosis not present

## 2018-12-01 DIAGNOSIS — R1010 Upper abdominal pain, unspecified: Secondary | ICD-10-CM

## 2018-12-07 ENCOUNTER — Other Ambulatory Visit: Payer: Self-pay | Admitting: Internal Medicine

## 2018-12-07 DIAGNOSIS — N200 Calculus of kidney: Secondary | ICD-10-CM

## 2018-12-16 ENCOUNTER — Other Ambulatory Visit: Payer: Self-pay | Admitting: Cardiology

## 2019-01-04 ENCOUNTER — Other Ambulatory Visit: Payer: Self-pay

## 2019-01-04 ENCOUNTER — Ambulatory Visit (HOSPITAL_BASED_OUTPATIENT_CLINIC_OR_DEPARTMENT_OTHER)
Admission: RE | Admit: 2019-01-04 | Discharge: 2019-01-04 | Disposition: A | Discharge status: Home / Self Care | Payer: No Typology Code available for payment source | Source: Ambulatory Visit | Attending: Internal Medicine | Admitting: Internal Medicine

## 2019-01-04 DIAGNOSIS — R945 Abnormal results of liver function studies: Secondary | ICD-10-CM | POA: Diagnosis present

## 2019-01-04 DIAGNOSIS — R109 Unspecified abdominal pain: Secondary | ICD-10-CM | POA: Diagnosis present

## 2019-02-12 ENCOUNTER — Other Ambulatory Visit: Payer: Self-pay | Admitting: Cardiology

## 2019-06-17 ENCOUNTER — Other Ambulatory Visit: Payer: Self-pay

## 2019-06-17 DIAGNOSIS — Z20822 Contact with and (suspected) exposure to covid-19: Secondary | ICD-10-CM

## 2019-06-18 LAB — NOVEL CORONAVIRUS, NAA: SARS-CoV-2, NAA: NOT DETECTED

## 2019-08-16 ENCOUNTER — Telehealth: Payer: Self-pay | Admitting: Cardiology

## 2019-08-16 NOTE — Telephone Encounter (Signed)
Encounter not needed

## 2019-09-06 ENCOUNTER — Other Ambulatory Visit: Payer: Self-pay | Admitting: Cardiology

## 2019-09-06 DIAGNOSIS — I10 Essential (primary) hypertension: Secondary | ICD-10-CM

## 2019-09-06 DIAGNOSIS — I2510 Atherosclerotic heart disease of native coronary artery without angina pectoris: Secondary | ICD-10-CM

## 2019-09-06 DIAGNOSIS — Z9861 Coronary angioplasty status: Secondary | ICD-10-CM

## 2019-09-06 NOTE — Telephone Encounter (Signed)
     1. Which medications need to be refilled? (please list name of each medication and dose if known)  Potassium Chloride CR (MICRO-K) 8 MEQ CPCR capsule CR nitroGLYCERIN (NITROSTAT) 0.4 MG SL tablet carvedilol (COREG) 25 MG tablet rosuvastatin (CRESTOR) 40 MG tablet amLODipine (NORVASC) 10 MG tablet isosorbide mononitrate (IMDUR) 60 MG 24 hr tablet   2. Which pharmacy/location (including street and city if local pharmacy) is medication to be sent to?:  [facility] ([place], [address]  3. Do they need a 30 day or 90 day supply? 90  Patient is transferring all of his medication from a local pharmacy to a mail order pharmacy and will need new Rx sent to the mail order pharmacy.   Rx can be sent via fax at [phone] if e-scrbe service does not work

## 2019-09-07 ENCOUNTER — Other Ambulatory Visit: Payer: Self-pay

## 2019-09-07 ENCOUNTER — Other Ambulatory Visit: Payer: Self-pay | Admitting: Internal Medicine

## 2019-09-07 ENCOUNTER — Encounter: Payer: Self-pay | Admitting: Medical

## 2019-09-07 ENCOUNTER — Ambulatory Visit (INDEPENDENT_AMBULATORY_CARE_PROVIDER_SITE_OTHER): Payer: Medicare Other | Admitting: Family Medicine

## 2019-09-07 VITALS — BP 138/80 | HR 68 | Ht 69.0 in | Wt 257.4 lb

## 2019-09-07 DIAGNOSIS — I25110 Atherosclerotic heart disease of native coronary artery with unstable angina pectoris: Secondary | ICD-10-CM | POA: Diagnosis not present

## 2019-09-07 DIAGNOSIS — Z9861 Coronary angioplasty status: Secondary | ICD-10-CM | POA: Diagnosis not present

## 2019-09-07 DIAGNOSIS — R1010 Upper abdominal pain, unspecified: Secondary | ICD-10-CM

## 2019-09-07 DIAGNOSIS — R972 Elevated prostate specific antigen [PSA]: Secondary | ICD-10-CM

## 2019-09-07 DIAGNOSIS — I10 Essential (primary) hypertension: Secondary | ICD-10-CM

## 2019-09-07 DIAGNOSIS — I2510 Atherosclerotic heart disease of native coronary artery without angina pectoris: Secondary | ICD-10-CM

## 2019-09-07 DIAGNOSIS — G4733 Obstructive sleep apnea (adult) (pediatric): Secondary | ICD-10-CM

## 2019-09-07 DIAGNOSIS — R109 Unspecified abdominal pain: Secondary | ICD-10-CM

## 2019-09-07 NOTE — Progress Notes (Signed)
Cardiology Office Note  Date: 09/07/2019   ID: Anthony Chapman, DOB 08-13-1950, MRN [id number]  PCP:  Cassandria Anger, MD  Cardiologist:  Peter Martinique, MD Electrophysiologist:  None   Chief Complaint  Patient presents with  . Follow-up    History of Present Illness: Anthony Chapman is a 69 y.o. male last encounter with Dr. Martinique via telemedicine on November 17, 2018. Past medical history of coronary artery disease.  Cardiac catheterization in 2010 with DES to RCA.  Subsequent cath in 2015 with DES to RCA and left circumflex 2015.  Had a third cardiac catheterization  2018 with DES to left circumflex.  Also had 100% in-stent restenosis of distal RCA with left-to-right collaterals, 10% ostial LAD in-stent restenosis, 90% mid left circumflex stenosis treated with DES. Additional past medical history includes hyperlipidemia, hypertension, prediabetes, left bundle branch block, arthritis, erectile dysfunction, GERD, obesity, obstructive sleep apnea..  History of smoking approximately 30 years 1 pack/day.  Quit 20 years ago.  At that visit patient had reported using his sublingual nitroglycerin more frequently due to having to help his wife who had undergone recent shoulder surgery.  His angina was described as about the same as it had been for the previous 2 years.  [facility].  He goes to the [place] for lab work.  Cardiac medications reviewed today.  Patient is taking amlodipine 10 mg daily, aspirin 81 mg daily, carvedilol 25 mg p.o. twice daily, isosorbide mononitrate 60 mg daily, nitroglycerin sublingual 0.4 mg sublingual as needed, Crestor 40 mg daily.  Patient denies any recent acute illnesses, hospitalizations, surgeries, or travels.  Denies any exposure to Covid virus.  Denies any Covid symptoms.  Recently had a Covid test which was negative.  Patient states he recently had some lab work 3 weeks ago with was unsure as to who ordered it and what the lab work was for.  He has  a labs in the system ordered by his primary care physician in April of last year but apparently has had not had the blood work drawn.  Patient states he slipped and started eating some salty bacon recently and noticed his blood pressure went up.  States blood pressure at home generally speaking is 130s over 80s.  He states sometimes when he is in a hurry and walks fast he gets a little chest pressure and mild dyspnea and occasionally takes a sublingual nitroglycerin.     Past Medical History:  Diagnosis Date  . Anginal pain ([place])   . Arthritis    "all over" (08/27/2016)  . CAD (coronary artery disease)    a.  stent placement to the RCA in 2010 b. cath in 2015 with PCI to the RCA (2.61m x15mResolute DES) and LCx (3.64m17m11m57msolute DES).  c. 08/2016: cath showing 100% ISR of d-RCA with collaterals, 10% Ost LAD ISR, 90% mid-Cx stenosis (3.0x18mm52molute DES placed)  . ED (erectile dysfunction)   . GERD (gastroesophageal reflux disease)   . HTN (hypertension)   . Hyperlipemia   . Myocardial infarction (HCC) [place]0; 2015    Past Surgical History:  Procedure Laterality Date  . CARDIAC CATHETERIZATION  09/2013  . CARDIAC CATHETERIZATION N/A 08/27/2016   Procedure: Left Heart Cath and Coronary Angiography;  Surgeon: DavidLeonie Man  [facility];  Service: Cardiovascular;  Laterality: N/A;  . CARDIAC CATHETERIZATION N/A 08/27/2016   Procedure: Coronary Stent Intervention;  Surgeon: DavidLeonie Man  Location: [place] [facility];  Service: Cardiovascular;  Laterality: N/A;  . CORONARY ANGIOPLASTY WITH STENT PLACEMENT  2010; 1/192018  . HERNIA REPAIR    . INGUINAL HERNIA REPAIR Left   . UMBILICAL HERNIA REPAIR      Current Outpatient Medications  Medication Sig Dispense Refill  . amLODipine (NORVASC) 10 MG tablet TAKE 1 TABLET(10 MG) BY MOUTH DAILY 90 tablet 3  . aspirin 81 MG EC tablet Take 1 tablet (81 mg total) by mouth daily. 30 tablet 0  . carvedilol (COREG)  25 MG tablet TAKE 1 TABLET(25 MG) BY MOUTH TWICE DAILY WITH A MEAL 60 tablet 5  . cetirizine (ZYRTEC) 10 MG tablet Take 10 mg by mouth 2 (two) times daily.    Marland Kitchen HYDROcodone-acetaminophen (NORCO/VICODIN) 5-325 MG tablet Take 1 tablet by mouth every 6 (six) hours as needed for severe pain. 20 tablet 0  . isosorbide mononitrate (IMDUR) 60 MG 24 hr tablet Take 1 tablet (60 mg total) by mouth daily. NEED OV. 30 tablet 1  . montelukast (SINGULAIR) 10 MG tablet Take 10 mg by mouth 2 (two) times daily.    . nitroGLYCERIN (NITROSTAT) 0.4 MG SL tablet PLACE 1 TABLET UNDER THE TOUNGE EVERY 5 MINUTES AS NEEDED FOR CHEST PAIN 25 tablet 3  . Potassium Chloride CR (MICRO-K) 8 MEQ CPCR capsule CR Take 1 capsule (8 mEq total) by mouth daily. 30 capsule 8  . ranitidine (ZANTAC) 150 MG capsule Take 150 mg by mouth daily.    . rosuvastatin (CRESTOR) 40 MG tablet TAKE 1 TABLET BY MOUTH EVERY DAY 45 tablet 9  . VITAMIN D, CHOLECALCIFEROL, PO Take 1 tablet by mouth.     No current facility-administered medications for this visit.   Allergies:  Losartan   Social History: The patient  reports that he quit smoking about 21 years ago. His smoking use included cigarettes. He has a 30.00 pack-year smoking history. He has never used smokeless tobacco. He reports that he does not drink alcohol or use drugs.   Family History: The patient's family history includes Arthritis in his father; CAD in his maternal aunt and mother; Hypertension in his father, mother, and another family member.   ROS:  Please see the history of present illness. Otherwise, complete review of systems is positive for none.  All other systems are reviewed and negative.   Physical Exam: VS:  BP 138/80   Pulse 68   Ht 5' 9"  (1.753 m)   Wt 257 lb 6.4 oz (116.8 kg)   SpO2 94%   BMI 38.01 kg/m , BMI Body mass index is 38.01 kg/m.  Wt Readings from Last 3 Encounters:  09/07/19 257 lb 6.4 oz (116.8 kg)  11/17/18 250 lb (113.4 kg)  06/09/17 252 lb  (114.3 kg)    General: Obese patient appears comfortable at rest. Neck: Supple, no elevated JVP or carotid bruits, no thyromegaly. Lungs: Clear to auscultation, nonlabored breathing at rest. Cardiac: Regular rate and rhythm, no S3 or significant systolic murmur, no pericardial rub. Extremities: No pitting edema, distal pulses 2+. Skin: Warm and dry. Neuropsychiatric: Alert and oriented x3, affect grossly appropriate.  ECG:  An ECG dated September 07, 2019 was personally reviewed today and demonstrated:  Normal sinus rhythm rate of 68 left bundle branch block  Recent Labwork: No results found for requested labs within last 8760 hours.     Component Value Date/Time   CHOL 123 12/07/2016 1516   TRIG 92 12/07/2016 1516   HDL 26 (L)  12/07/2016 1516   CHOLHDL 4.7 12/07/2016 1516   VLDL 18 12/07/2016 1516   LDLCALC 79 12/07/2016 1516   LDLDIRECT 199.5 07/25/2012 0935    Other Studies Reviewed Today:  Cardiac catheterization August 27, 2016 Left Main  Vessel is large.  Left Anterior Descending  Ost LAD to Prox LAD lesion 10% stenosed  The lesion is smooth. The lesion was previously treated using a stent (unknown type) over 2 years ago.  First Diagonal Branch  Vessel is moderate in size. Vessel is angiographically normal.  First Septal Branch  Vessel is small in size.  Second Septal Branch  Vessel is small in size.  Third Septal Branch  Vessel is small in size.  Ramus Intermedius  Vessel is moderate in size.  Left Circumflex  Prox Cx lesion 0% stenosed  Previously placed Prox Cx drug eluting stent is widely patent. 2015  Mid Cx lesion 90% stenosed  Culprit lesion. The lesion is located at the bend, eccentric and irregular.  First Obtuse Marginal Branch  Vessel is small in size.  Second Obtuse Marginal Branch  2nd Mrg lesion 40% stenosed  The lesion is focal, tubular and smooth. At takeoff of small side branch  Right Coronary Artery  Vessel is large.  Dist RCA lesion 100%  stenosed  The lesion is chronically occluded. The lesion was previously treated using a drug eluting stent and a stent (unknown type) . Previously placed stent displays rethrombosis. Previously placed stent displays restenosis.  Acute Marginal Branch  Vessel is moderate in size.  Right Posterior Descending Artery  Vessel is moderate in size.  Inferior Septal  Vessel is small in size.  Collaterals  Inf Sept filled by collaterals from 1st Sept.    Right Posterior Atrioventricular Artery  Vessel is moderate in size.  First Right Posterolateral Branch  Vessel is moderate in size.  Second Right Posterolateral Branch  Vessel is moderate in size.  Collaterals  2nd RPL filled by collaterals from Dist Cx.    Third Right Posterolateral Branch  Vessel is small in size.  Intervention  Prox Cx lesion  Angioplasty (Also treats lesions: Mid Cx)  Lesion length: 16 mm. Lesion crossed with guidewire using a WIRE LUGE 182CM. Pre-stent angioplasty was performed using a BALLOON MOZEC 2.50X14. Maximum pressure: 12 atm. Inflation time: 20 sec. WIRE HI TORQ [employer] 190CM [id number]) - initial wire A STENT RESOLUTE ONYX 3.0X18 drug eluting stent was successfully placed, and overlaps previously placed stent. Minimum lumen area: 3.5 mm. Stent strut is well apposed. Post-stent angioplasty was performed using a BALLOON [place] MOZEC 3.5X13. Maximum pressure: 16 atm. Inflation time: 20 sec. The pre-interventional distal flow is normal (TIMI 3). The post-interventional distal flow is normal (TIMI 3). No complications occurred at this lesion. GUIDE CATH RUNWAY 6FR CLS3.5 (Yes)  There is a 0% residual stenosis post intervention.  Mid Cx lesion  Angioplasty (Also treats lesions: Prox Cx)  Lesion length: 16 mm. Lesion crossed with guidewire using a WIRE LUGE 182CM. Pre-stent angioplasty was performed using a BALLOON MOZEC 2.50X14. Maximum pressure: 12 atm. Inflation time: 20 sec. WIRE HI TORQ [employer] 190CM [id number]) - initial wire A STENT  RESOLUTE ONYX 3.0X18 drug eluting stent was successfully placed, and overlaps previously placed stent. Minimum lumen area: 3.5 mm. Stent strut is well apposed. Post-stent angioplasty was performed using a BALLOON [place] MOZEC 3.5X13. Maximum pressure: 16 atm. Inflation time: 20 sec. The pre-interventional distal flow is normal (TIMI 3). The post-interventional distal flow is normal (TIMI 3). No  complications occurred at this lesion. GUIDE CATH RUNWAY 6FR CLS3.5 (Yes)  There is a 0% residual stenosis post intervention.     Assessment and Plan:  1. Essential hypertension   2. CAD S/P percutaneous coronary angioplasty - 2015 PCI to RCA (2.5 x 14 Resolute DES) & Cx (3 x 12 Resolute DES)   3. OSA (obstructive sleep apnea)   4. Atherosclerosis of native coronary artery of native heart with unstable angina pectoris ([place])   5. Coronary artery disease involving native coronary artery of native heart without angina pectoris    1. Essential hypertension Blood pressure elevated on arrival.  Recheck in left arm was 138/80.  Patient states usually at home his blood pressures range in the 130s over 80s.  States he takes his blood pressure medications as directed.  Continue amlodipine 10 mg, carvedilol 25 mg p.o. twice daily, Imdur 60 mg daily, Crestor 40 mg daily.  Monitor your blood pressures and keep a log for the next few months.  Bring them with you at follow-up visit with Dr. Martinique.  Watch salt intake.  Modify dietary intake by decreasing saturated fat intake and simple carbohydrates.  Increase activity to help with weight loss.  Patient has lab work which was ordered by his primary care provider last year in April.  Patient has not had lab work done as of this date.  Patient agreed to have lab work drawn here in our lab.  Get CBC, BMP, TSH, lipid profile, PSA, hepatic profile.  2. CAD S/P percutaneous coronary angioplasty - 2015 PCI to RCA (2.5 x 14 Resolute DES) & Cx (3 x 12 Resolute DES) RCA stent 2010.  RCA  and left circumflex stent 2015.  Catheterization in 2018 showing 100% in-stent restenosis of RCA with collaterals.  10% ostial LAD in-stent restenosis.  90% mid circumflex stenosis with drug-eluting stent placement.  3. OSA (obstructive sleep apnea) On CPAP and compliant.  4. Atherosclerosis of native coronary artery of native heart with unstable angina pectoris (HCC) Has mild anginal symptoms when walking fast or being in a hurry.  He describes the anginal symptoms as chest tightness.  He denies any radiation to arms, neck, jaw, or back.  Denies any associated nausea, or diaphoresis.  Continue Imdur 60 mg daily and sublingual nitroglycerin as needed.  Patient states he thinks he has some anxiety issues which may contribute to the chest tightness.  Advised patient to talk to PCP about possible as needed anxiety medication.  Medication Adjustments/Labs and Tests Ordered: Current medicines are reviewed at length with the patient today.  Concerns regarding medicines are outlined above.    Patient Instructions  Medication Instructions:  Your physician recommends that you continue on your current medications as directed. Please refer to the Current Medication list given to you today.  *If you need a refill on your cardiac medications before your next appointment, please call your pharmacy*  Lab Work: You will need to have labs (blood work) drawn today that was previously ordered by your PCP:  BMET  CBC w/Diff  Hepatic (Liver) Function  PSA  TSH  If you have labs (blood work) drawn today and your tests are completely normal, you will receive your results only by: Marland Kitchen MyChart Message (if you have MyChart) OR . A paper copy in the mail If you have any lab test that is abnormal or we need to change your treatment, we will call you to review the results.  Testing/Procedures: NONE ordered at this time of appointment  Follow-Up: At [facility], you and your health needs are our  priority.  As part of our continuing mission to provide you with exceptional heart care, we have created designated Provider Care Teams.  These Care Teams include your primary Cardiologist (physician) and Advanced Practice Providers (APPs -  Physician Assistants and Nurse Practitioners) who all work together to provide you with the care you need, when you need it.  Your next appointment:   9 month(s)  The format for your next appointment:   In Person  Provider:   Peter Martinique, MD  Other Instructions           Signed, Levell July, NP 09/07/2019 4:53 PM    [facility] at [address], [place], [place] [address] Phone: [phone]; Fax: [phone]

## 2019-09-07 NOTE — Patient Instructions (Addendum)
Medication Instructions:  Your physician recommends that you continue on your current medications as directed. Please refer to the Current Medication list given to you today.  *If you need a refill on your cardiac medications before your next appointment, please call your pharmacy*  Lab Work: You will need to have labs (blood work) drawn today that was previously ordered by your PCP:  BMET  CBC w/Diff  Hepatic (Liver) Function  PSA  TSH  If you have labs (blood work) drawn today and your tests are completely normal, you will receive your results only by: Marland Kitchen MyChart Message (if you have MyChart) OR . A paper copy in the mail If you have any lab test that is abnormal or we need to change your treatment, we will call you to review the results.  Testing/Procedures: NONE ordered at this time of appointment   Follow-Up: At [facility], you and your health needs are our priority.  As part of our continuing mission to provide you with exceptional heart care, we have created designated Provider Care Teams.  These Care Teams include your primary Cardiologist (physician) and Advanced Practice Providers (APPs -  Physician Assistants and Nurse Practitioners) who all work together to provide you with the care you need, when you need it.  Your next appointment:   9 month(s)  The format for your next appointment:   In Person  Provider:   Peter Martinique, MD  Other Instructions

## 2019-09-08 LAB — BASIC METABOLIC PANEL
BUN/Creatinine Ratio: 15 (ref 10–24)
BUN: 13 mg/dL (ref 8–27)
CO2: 25 mmol/L (ref 20–29)
Calcium: 8.6 mg/dL (ref 8.6–10.2)
Chloride: 104 mmol/L (ref 96–106)
Creatinine, Ser: 0.88 mg/dL (ref 0.76–1.27)
GFR calc Af Amer: 103 mL/min/{1.73_m2} (ref >59)
GFR calc non Af Amer: 89 mL/min/{1.73_m2} (ref >59)
Glucose: 101 mg/dL — ABNORMAL HIGH (ref 65–99)
Potassium: 3.8 mmol/L (ref 3.5–5.2)
Sodium: 139 mmol/L (ref 134–144)

## 2019-09-08 LAB — HEPATIC FUNCTION PANEL
ALT: 12 IU/L (ref 0–44)
AST: 18 IU/L (ref 0–40)
Albumin: 3.8 g/dL (ref 3.8–4.8)
Alkaline Phosphatase: 147 IU/L — ABNORMAL HIGH (ref 39–117)
Bilirubin Total: 0.4 mg/dL (ref 0.0–1.2)
Bilirubin, Direct: 0.14 mg/dL (ref 0.00–0.40)
Total Protein: 7.6 g/dL (ref 6.0–8.5)

## 2019-09-08 LAB — CBC WITH DIFFERENTIAL/PLATELET
Basophils Absolute: 0.1 10*3/uL (ref 0.0–0.2)
Basos: 1 %
EOS (ABSOLUTE): 0.4 10*3/uL (ref 0.0–0.4)
Eos: 6 %
Hematocrit: 41.9 % (ref 37.5–51.0)
Hemoglobin: 13.9 g/dL (ref 13.0–17.7)
Immature Grans (Abs): 0 10*3/uL (ref 0.0–0.1)
Immature Granulocytes: 0 %
Lymphocytes Absolute: 1.5 10*3/uL (ref 0.7–3.1)
Lymphs: 25 %
MCH: 26.5 pg — ABNORMAL LOW (ref 26.6–33.0)
MCHC: 33.2 g/dL (ref 31.5–35.7)
MCV: 80 fL (ref 79–97)
Monocytes Absolute: 0.6 10*3/uL (ref 0.1–0.9)
Monocytes: 10 %
Neutrophils Absolute: 3.6 10*3/uL (ref 1.4–7.0)
Neutrophils: 58 %
Platelets: 161 10*3/uL (ref 150–450)
RBC: 5.24 x10E6/uL (ref 4.14–5.80)
RDW: 13.9 % (ref 11.6–15.4)
WBC: 6.1 10*3/uL (ref 3.4–10.8)

## 2019-09-08 LAB — TSH: TSH: 2.96 u[IU]/mL (ref 0.450–4.500)

## 2019-09-08 LAB — PSA: Prostate Specific Ag, Serum: 4.1 ng/mL — ABNORMAL HIGH (ref 0.0–4.0)

## 2019-09-10 MED ORDER — NITROGLYCERIN 0.4 MG SL SUBL: SUBLINGUAL_TABLET | SUBLINGUAL | 3 refills | Status: DC

## 2019-09-10 MED ORDER — ISOSORBIDE MONONITRATE ER 60 MG PO TB24: 60.0000 mg | ORAL_TABLET | Freq: Every day | ORAL | 3 refills | Status: DC

## 2019-09-10 MED ORDER — ROSUVASTATIN CALCIUM 40 MG PO TABS: 40.0000 mg | ORAL_TABLET | Freq: Every day | ORAL | 3 refills | Status: DC

## 2019-09-10 MED ORDER — POTASSIUM CHLORIDE ER 8 MEQ PO CPCR: 8.0000 meq | ORAL_CAPSULE | Freq: Every day | ORAL | 3 refills | Status: DC

## 2019-09-10 MED ORDER — CARVEDILOL 25 MG PO TABS: ORAL_TABLET | ORAL | 3 refills | Status: DC

## 2019-09-10 MED ORDER — AMLODIPINE BESYLATE 10 MG PO TABS: ORAL_TABLET | ORAL | 3 refills | Status: DC

## 2019-10-25 ENCOUNTER — Ambulatory Visit: Payer: Medicare Other | Admitting: Internal Medicine

## 2019-10-31 ENCOUNTER — Encounter: Payer: Self-pay | Admitting: Internal Medicine

## 2019-10-31 ENCOUNTER — Ambulatory Visit (INDEPENDENT_AMBULATORY_CARE_PROVIDER_SITE_OTHER): Payer: Medicare Other | Admitting: Internal Medicine

## 2019-10-31 ENCOUNTER — Other Ambulatory Visit: Payer: Self-pay

## 2019-10-31 DIAGNOSIS — I700 Atherosclerosis of aorta: Secondary | ICD-10-CM | POA: Insufficient documentation

## 2019-10-31 DIAGNOSIS — R635 Abnormal weight gain: Secondary | ICD-10-CM

## 2019-10-31 DIAGNOSIS — I2510 Atherosclerotic heart disease of native coronary artery without angina pectoris: Secondary | ICD-10-CM

## 2019-10-31 DIAGNOSIS — Z9861 Coronary angioplasty status: Secondary | ICD-10-CM

## 2019-10-31 DIAGNOSIS — I10 Essential (primary) hypertension: Secondary | ICD-10-CM

## 2019-10-31 DIAGNOSIS — R972 Elevated prostate specific antigen [PSA]: Secondary | ICD-10-CM

## 2019-10-31 NOTE — Progress Notes (Signed)
Subjective:  Patient ID: Anthony Chapman, male    DOB: Dec 10, 1950  Age: 69 y.o. MRN: [id number]  CC: No chief complaint on file.   HPI TERRIO TURIN presents for CAD, HTN, dyslipidemia C/o CP w/moving in the bed x 1 month  Outpatient Medications Prior to Visit  Medication Sig Dispense Refill  . amLODipine (NORVASC) 10 MG tablet TAKE 1 TABLET(10 MG) BY MOUTH DAILY 90 tablet 3  . aspirin 81 MG EC tablet Take 1 tablet (81 mg total) by mouth daily. 30 tablet 0  . carvedilol (COREG) 25 MG tablet TAKE 1 TABLET(25 MG) BY MOUTH TWICE DAILY WITH A MEAL 180 tablet 3  . cetirizine (ZYRTEC) 10 MG tablet Take 10 mg by mouth 2 (two) times daily.    Marland Kitchen HYDROcodone-acetaminophen (NORCO/VICODIN) 5-325 MG tablet Take 1 tablet by mouth every 6 (six) hours as needed for severe pain. 20 tablet 0  . isosorbide mononitrate (IMDUR) 60 MG 24 hr tablet Take 1 tablet (60 mg total) by mouth daily. 90 tablet 3  . montelukast (SINGULAIR) 10 MG tablet Take 10 mg by mouth 2 (two) times daily.    . nitroGLYCERIN (NITROSTAT) 0.4 MG SL tablet PLACE 1 TABLET UNDER THE TOUNGE EVERY 5 MINUTES AS NEEDED FOR CHEST PAIN 25 tablet 3  . Potassium Chloride CR (MICRO-K) 8 MEQ CPCR capsule CR Take 1 capsule (8 mEq total) by mouth daily. 90 capsule 3  . ranitidine (ZANTAC) 150 MG capsule Take 150 mg by mouth daily.    . rosuvastatin (CRESTOR) 40 MG tablet Take 1 tablet (40 mg total) by mouth daily. 90 tablet 3  . VITAMIN D, CHOLECALCIFEROL, PO Take 1 tablet by mouth.     No facility-administered medications prior to visit.    ROS: Review of Systems  Constitutional: Negative for appetite change, fatigue and unexpected weight change.  HENT: Negative for congestion, nosebleeds, sneezing, sore throat and trouble swallowing.   Eyes: Negative for itching and visual disturbance.  Respiratory: Negative for cough.   Cardiovascular: Negative for chest pain, palpitations and leg swelling.  Gastrointestinal: Negative for abdominal  distention, blood in stool, diarrhea and nausea.  Genitourinary: Negative for frequency and hematuria.  Musculoskeletal: Negative for back pain, gait problem, joint swelling and neck pain.  Skin: Negative for rash.  Neurological: Negative for dizziness, tremors, speech difficulty and weakness.  Psychiatric/Behavioral: Negative for agitation, dysphoric mood, sleep disturbance and suicidal ideas. The patient is not nervous/anxious.     Objective:  BP (!) 152/86 (BP Location: Left Arm, Patient Position: Sitting, Cuff Size: Normal)   Pulse 79   Temp 97.9 F (36.6 C) (Oral)   Ht 5\' 9"  (1.753 m)   Wt 254 lb 4 oz (115.3 kg)   SpO2 93%   BMI 37.55 kg/m   BP Readings from Last 3 Encounters:  10/31/19 (!) 152/86  09/07/19 138/80  11/17/18 134/86    Wt Readings from Last 3 Encounters:  10/31/19 254 lb 4 oz (115.3 kg)  09/07/19 257 lb 6.4 oz (116.8 kg)  11/17/18 250 lb (113.4 kg)    Physical Exam Constitutional:      General: He is not in acute distress.    Appearance: He is well-developed. He is obese.     Comments: NAD  Eyes:     Conjunctiva/sclera: Conjunctivae normal.     Pupils: Pupils are equal, round, and reactive to light.  Neck:     Thyroid: No thyromegaly.     Vascular: No JVD.  Cardiovascular:  Rate and Rhythm: Normal rate and regular rhythm.     Heart sounds: Normal heart sounds. No murmur. No friction rub. No gallop.   Pulmonary:     Effort: Pulmonary effort is normal. No respiratory distress.     Breath sounds: Normal breath sounds. No wheezing or rales.  Chest:     Chest wall: No tenderness.  Abdominal:     General: Bowel sounds are normal. There is no distension.     Palpations: Abdomen is soft. There is no mass.     Tenderness: There is no abdominal tenderness. There is no guarding or rebound.  Musculoskeletal:        General: No tenderness. Normal range of motion.     Cervical back: Normal range of motion.  Lymphadenopathy:     Cervical: No cervical  adenopathy.  Skin:    General: Skin is warm and dry.     Findings: No rash.  Neurological:     Mental Status: He is alert and oriented to person, place, and time.     Cranial Nerves: No cranial nerve deficit.     Motor: No abnormal muscle tone.     Coordination: Coordination normal.     Gait: Gait normal.     Deep Tendon Reflexes: Reflexes are normal and symmetric.  Psychiatric:        Behavior: Behavior normal.        Thought Content: Thought content normal.        Judgment: Judgment normal.    Skin lesion - R nostril Lab Results  Component Value Date   WBC 6.1 09/07/2019   HGB 13.9 09/07/2019   HCT 41.9 09/07/2019   PLT 161 09/07/2019   GLUCOSE 101 (H) 09/07/2019   CHOL 123 12/07/2016   TRIG 92 12/07/2016   HDL 26 (L) 12/07/2016   LDLDIRECT 199.5 07/25/2012   LDLCALC 79 12/07/2016   ALT 12 09/07/2019   AST 18 09/07/2019   NA 139 09/07/2019   K 3.8 09/07/2019   CL 104 09/07/2019   CREATININE 0.88 09/07/2019   BUN 13 09/07/2019   CO2 25 09/07/2019   TSH 2.960 09/07/2019   PSA 4.77 (H) 07/09/2016   INR 1.1 08/16/2016   HGBA1C 6.2 07/09/2016    US ABDOMEN LIMITED RUQ  Result Date: 01/04/2019 CLINICAL DATA:  Abdominal discomfort with elevated liver enzymes EXAM: ULTRASOUND ABDOMEN LIMITED RIGHT UPPER QUADRANT COMPARISON:  CT scan 12/01/2018 FINDINGS: Gallbladder: No gallstones or gallbladder wall thickening. No pericholecystic fluid. The sonographer reports no sonographic Murphy's sign. Common bile duct: Diameter: 4 mm Liver: Increased echogenicity of the liver parenchyma suggests fatty deposition. Portal vein is patent on color Doppler imaging with normal direction of blood flow towards the liver. IMPRESSION: 1. No intra or extrahepatic biliary duct dilatation. 2. Increased echogenicity of liver parenchyma suggests steatosis. Electronically Signed   By: Misty Stanley M.D.   On: 01/04/2019 09:50    Assessment & Plan:   There are no diagnoses linked to this  encounter.   No orders of the defined types were placed in this encounter.    Follow-up: No follow-ups on file.  Walker Kehr, MD

## 2019-10-31 NOTE — Patient Instructions (Signed)
FEMA mass vaccine site in [place]:  Call the COVID-19 Vaccine Help Center at [phone] to schedule your shot at [place].   [place]'s federal vaccine clinic is preparing to open on Wednesday 10/17/19. The FEMA site at [place] has the capacity to vaccinate 3,000 people a day for eight weeks. That's nearly 170,000 doses just from this one clinic.   With such a big operation, here are answers to some questions you may have about the drive-thru and indoor site:  How do I make an appointment?   Head to [email or web address] to schedule your appointment indoors or in the drive-thru, or call the COVID-19 Vaccine Help Center at [phone].  What if I need to change or cancel my appointment, or have more questions?   Call the COVID-19 Vaccine Help Center at [phone].  What vaccines will be available at the clinic?   The vaccine clinic will begin giving both Pfizer and Moderna two-dose COVID-19 vaccines. The single-dose [employer] vaccines will be given during the last two weeks of the clinic.   What part of the [place] do I enter for my appointment?  Enter from [address] and turn onto [address]. A clinic staff member will confirm your appointment for the day. You'll then either be directed to registration or to a waiting area until your appointment time.  Can I be seen sooner if I'm early?  Those who are early will park in a designated waiting area until their vaccine appointment time approaches.  How does registration work?  There are five open lanes for registration. Someone will get the necessary information needed to confirm appointments and other details to keep a record of who's getting the vaccine every day. Temperatures will be checked to make sure you're in good shape to get the vaccine.  How long will it take to get my shot?  You'll park your car in a long tent with about 10  other vehicles. All 10 people in that group will get a shot inside their vehicles. Getting the actual shot only takes a few minutes. The entire process takes about 30 minutes.  How does the observation period work?  All patients will wait in the same tent they got their vaccine at for 15 minutes.       

## 2019-10-31 NOTE — Assessment & Plan Note (Signed)
CT: There is aortic and iliac artery atherosclerosis. There are foci of coronary artery calcification. There is mild dilatation of the distal abdominal aorta 3.3 x 3.0 cm. Recommend followup by ultrasound in 3 years.  On Crestor

## 2019-11-04 ENCOUNTER — Encounter: Payer: Self-pay | Admitting: Internal Medicine

## 2019-11-04 NOTE — Assessment & Plan Note (Signed)
Continue with isosorbide, Hyzaar, Norvasc

## 2019-11-04 NOTE — Assessment & Plan Note (Signed)
Better.  He is starting to lose weight.

## 2019-11-04 NOTE — Assessment & Plan Note (Signed)
Repeat PSA Follow-up with Dr. Karsten Ro

## 2019-11-04 NOTE — Assessment & Plan Note (Signed)
No angina.  Continue with isosorbide, nitroglycerin as needed, aspirin, Plavix, Norvasc

## 2020-03-05 ENCOUNTER — Telehealth: Payer: Self-pay | Admitting: Cardiology

## 2020-03-05 NOTE — Telephone Encounter (Signed)
Pt mentioned that he needs all medication refilled

## 2020-03-05 NOTE — Telephone Encounter (Signed)
Could you please use your prompts to list the names of the medications please, pt needs to know which medication and give you the names of the medicines. Please address

## 2020-03-07 ENCOUNTER — Other Ambulatory Visit: Payer: Self-pay | Admitting: Physician Assistant

## 2020-03-07 ENCOUNTER — Other Ambulatory Visit: Payer: Self-pay | Admitting: Cardiology

## 2020-03-08 ENCOUNTER — Other Ambulatory Visit: Payer: Self-pay | Admitting: Cardiology

## 2020-05-05 NOTE — Progress Notes (Deleted)
Cardiology Office Note   Date:  05/05/2020   ID:  Anthony Chapman, DOB 1951/05/10, MRN [id number]  PCP:  Cassandria Anger, MD  Cardiologist:   Rheda Kassab Martinique, MD   No chief complaint on file.     History of Present Illness: Anthony Chapman is a 69 y.o. male who presents for follow up CAD. He has a PMH of CAD (s/p DES to RCA in 2010, DES to RCA and LCx in 2015, DES to LCx 08/2016), HTN, HLD, prediabetes and chronic LBBB.Cardiac catheterization performed in January 2018 showed 100% ISR of distal RCA with left-to-right collaterals,10%ostial LAD in-stent restenosis, 90% mid left circumflex stenosis treated withResolute DES. He was last seen in our office in November 2018. Marland Kitchen He is followed by the [place].   Patient reports he is generally doing well. Does note that he is using his sl Ntg a little more frequently. Has chest pain if he has to do something quickly and he is also having to help his wife out more since she just had shoulder surgery. Otherwise his angina is about the same as it has been for the last 2 years. [facility]. His labs are followed at the [place]. He is on Zyrtec and ranitidine for allergies. No dyspnea, PND, orthopnea. Works as a Presenter, broadcasting.    Past Medical History:  Diagnosis Date  . Anginal pain ([place])   . Arthritis    "all over" (08/27/2016)  . CAD (coronary artery disease)    a.  stent placement to the RCA in 2010 b. cath in 2015 with PCI to the RCA (2.73mm x18mm Resolute DES) and LCx (3.67mm x95mm Resolute DES).  c. 08/2016: cath showing 100% ISR of d-RCA with collaterals, 10% Ost LAD ISR, 90% mid-Cx stenosis (3.0x12mm Resolute DES placed)  . ED (erectile dysfunction)   . GERD (gastroesophageal reflux disease)   . HTN (hypertension)   . Hyperlipemia   . Myocardial infarction ([place]) 2010; 2015    Past Surgical History:  Procedure Laterality Date  . CARDIAC CATHETERIZATION  09/2013  . CARDIAC CATHETERIZATION N/A 08/27/2016   Procedure: Left Heart  Cath and Coronary Angiography;  Surgeon: Leonie Man, MD;  Location: [facility];  Service: Cardiovascular;  Laterality: N/A;  . CARDIAC CATHETERIZATION N/A 08/27/2016   Procedure: Coronary Stent Intervention;  Surgeon: Leonie Man, MD;  Location: [facility];  Service: Cardiovascular;  Laterality: N/A;  . CORONARY ANGIOPLASTY WITH STENT PLACEMENT  2010; 1/192018  . HERNIA REPAIR    . INGUINAL HERNIA REPAIR Left   . UMBILICAL HERNIA REPAIR       Current Outpatient Medications  Medication Sig Dispense Refill  . amLODipine (NORVASC) 10 MG tablet TAKE 1 TABLET(10 MG) BY MOUTH DAILY 90 tablet 1  . aspirin 81 MG EC tablet Take 1 tablet (81 mg total) by mouth daily. 30 tablet 0  . carvedilol (COREG) 25 MG tablet TAKE 1 TABLET(25 MG) BY MOUTH TWICE DAILY WITH A MEAL 60 tablet 2  . cetirizine (ZYRTEC) 10 MG tablet Take 10 mg by mouth 2 (two) times daily.    . isosorbide mononitrate (IMDUR) 60 MG 24 hr tablet Take 1 tablet (60 mg total) by mouth daily. 90 tablet 3  . montelukast (SINGULAIR) 10 MG tablet Take 10 mg by mouth 2 (two) times daily.    . nitroGLYCERIN (NITROSTAT) 0.4 MG SL tablet PLACE 1 TABLET UNDER THE TONGUE EVERY 5 MINUTES AS NEEDED FOR CHEST  PAIN 25 tablet 3  . Potassium Chloride CR (MICRO-K) 8 MEQ CPCR capsule CR Take 1 capsule (8 mEq total) by mouth daily. 90 capsule 3  . ranitidine (ZANTAC) 150 MG capsule Take 150 mg by mouth daily.    . rosuvastatin (CRESTOR) 40 MG tablet TAKE 1 TABLET BY MOUTH EVERY DAY 45 tablet 2  . VITAMIN D, CHOLECALCIFEROL, PO Take 1 tablet by mouth.     No current facility-administered medications for this visit.    Allergies:   Losartan    Social History:  The patient  reports that he quit smoking about 21 years ago. His smoking use included cigarettes. He has a 30.00 pack-year smoking history. He has never used smokeless tobacco. He reports that he does not drink alcohol and does not use drugs.   Family History:  The patient's  ***family history includes Arthritis in his father; CAD in his maternal aunt and mother; Hypertension in his father, mother, and another family member.    ROS:  Please see the history of present illness.   Otherwise, review of systems are positive for {NONE DEFAULTED:18576::"none"}.   All other systems are reviewed and negative.    PHYSICAL EXAM: VS:  There were no vitals taken for this visit. , BMI There is no height or weight on file to calculate BMI. GEN: Well nourished, well developed, in no acute distress  HEENT: normal  Neck: no JVD, carotid bruits, or masses Cardiac: ***RRR; no murmurs, rubs, or gallops,no edema  Respiratory:  clear to auscultation bilaterally, normal work of breathing GI: soft, nontender, nondistended, + BS MS: no deformity or atrophy  Skin: warm and dry, no rash Neuro:  Strength and sensation are intact Psych: euthymic mood, full affect   EKG:  EKG {ACTION; IS/IS JJK:09381829} ordered today. The ekg ordered today demonstrates ***   Recent Labs: 09/07/2019: ALT 12; BUN 13; Creatinine, Ser 0.88; Hemoglobin 13.9; Platelets 161; Potassium 3.8; Sodium 139; TSH 2.960    Lipid Panel    Component Value Date/Time   CHOL 123 12/07/2016 1516   TRIG 92 12/07/2016 1516   HDL 26 (L) 12/07/2016 1516   CHOLHDL 4.7 12/07/2016 1516   VLDL 18 12/07/2016 1516   LDLCALC 79 12/07/2016 1516   LDLDIRECT 199.5 07/25/2012 0935      Wt Readings from Last 3 Encounters:  10/31/19 254 lb 4 oz (115.3 kg)  09/07/19 257 lb 6.4 oz (116.8 kg)  11/17/18 250 lb (113.4 kg)      Other studies Reviewed: Additional studies/ records that were reviewed today include:   Cardiac cath 08/27/16: Conclusion     Ost LAD to Prox LAD stent, 10 %stenosed.  Dist RCA Stent (Resolute DES 2.5 mg x 14 mm), 100 %stenosed. Fills via left to right collaterals  ____________CULPRIT______________________  Mid Cx lesion, 90 %stenosed after previous stent  A STENT RESOLUTE ONYX 3.0X18 drug  eluting stent was successfully placed, and overlaps previously placed stent.  Post intervention, there is a 0% residual stenosis.  __________________________________________  The left ventricular ejection fraction is 50-55% by visual estimate.  LV end diastolic pressure is moderately elevated.  Assessment difficult PCI of mid circumflex stent overlapping the previous he placed proximal stent. Chronically occluded distal RCA stent with brisk left left right collaterals. Patent LAD stent  Plan:  Overnight observation with anticipated discharge tomorrow morning.  TR band removal per protocol.  Continue aspirin plus Plavix, essentially lifelong.  He aggressively treating cardiac risk factors with statin and antihypertensives. (Beta Blocker and  Calcium Channel Blocker as he is allergic to ace inhibitors)       ASSESSMENT AND PLAN:  1. CAD: Multiple prior interventions with last being  DES to the left circumflex on 08/27/2016. He has class 2 angina. On therapy with ASA, Coreg, amlodipine and Imdur. No longer on Plavix. Will continue current therapy. If symptoms significantly increase in severity will reevaluate. Follow up in 6 months.   2. Hypertension:Blood pressure is generally under good control  3. Hyperlipidemia:on Crestor. Labs followed at the [place]. I asked him to send [place] a dopy.  4. Prediabetes: We'll defer to primary care provider    Current medicines are reviewed at length with the patient today.  The patient {ACTIONS; HAS/DOES NOT HAVE:19233} concerns regarding medicines.  The following changes have been made:  {PLAN; NO CHANGE:13088:s}  Labs/ tests ordered today include: *** No orders of the defined types were placed in this encounter.    Disposition:   FU with *** in {gen number 1-64:[id number]} {Days to years:10300}  Signed, Tashi Andujo Martinique, MD  05/05/2020 2:53 PM    [facility] [address] [place], [place], [address] Phone  [phone], Fax [phone]

## 2020-05-09 ENCOUNTER — Ambulatory Visit: Payer: Medicare Other | Admitting: Cardiology

## 2020-05-18 NOTE — Progress Notes (Signed)
Cardiology Office Note   Date:  05/19/2020   ID:  CAS TRACZ, DOB 08/30/1950, MRN [id number]  PCP:  Cassandria Anger, MD Cardiologist:  Peter Martinique, MD 11/17/2018 televisit A. Leonides Sake, NP 09/07/2019 Electrphysiologist: None Rosaria Ferries, PA-C   No chief complaint on file.   History of Present Illness: Anthony Chapman is a 69 y.o. male with a history of DES RCA 2010. Cath in 2015 w/ DES RCA & CFX, cath 2018 w DES CFX,  Also had 100% ISR dRCA with left-to-right collaterals, 10% ostial LAD in-stent restenosis. HTN, HLD, OA, ED, tob use, anxiety  01/29 office visit, BP on the high side, keep BP diary, increase activity, ck labs, angina was stable  Anthony Chapman presents for cardiology follow up.  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   He was mowing but had to quit because he would have CP with this.   He gets chest pain when he is under emotional stress, or when he has to walk a longer distance than usual. He wonders if the CP is related to his BP going up from doing something unfamiliar. He will sit and rest for a few minutes, then keep going. He has had chest pain 5-7 times in the last week.   This is more than it used to be. He is compliant w/ his medications. He has not had to take nitro, symptoms are relieved by rest. No chest pain that started at rest.   He has been following his BP, DBP usually in the 80s, SBP 135-148.    Past Medical History:  Diagnosis Date  . Anginal pain ([place])   . Arthritis    "all over" (08/27/2016)  . CAD (coronary artery disease)    a.  stent placement to the RCA in 2010 b. cath in 2015 with PCI to the RCA (2.54mm x32mm Resolute DES) and LCx (3.30mm x31mm Resolute DES).  c. 08/2016: cath showing 100% ISR of d-RCA with collaterals, 10% Ost LAD ISR, 90% mid-Cx stenosis (3.0x29mm Resolute DES placed)  . ED (erectile dysfunction)   . GERD (gastroesophageal reflux disease)    . HTN (hypertension)   . Hyperlipemia   . Myocardial infarction ([place]) 2010; 2015    Past Surgical History:  Procedure Laterality Date  . CARDIAC CATHETERIZATION  09/2013  . CARDIAC CATHETERIZATION N/A 08/27/2016   Procedure: Left Heart Cath and Coronary Angiography;  Surgeon: Leonie Man, MD;  Location: [facility];  Service: Cardiovascular;  Laterality: N/A;  . CARDIAC CATHETERIZATION N/A 08/27/2016   Procedure: Coronary Stent Intervention;  Surgeon: Leonie Man, MD;  Location: [facility];  Service: Cardiovascular;  Laterality: N/A;  . CORONARY ANGIOPLASTY WITH STENT PLACEMENT  2010; 1/192018  . HERNIA REPAIR    . INGUINAL HERNIA REPAIR Left   . UMBILICAL HERNIA REPAIR      Current Outpatient Medications  Medication Sig Dispense Refill  . amLODipine (NORVASC) 10 MG tablet TAKE 1 TABLET(10 MG) BY MOUTH DAILY 90 tablet 1  . aspirin 81 MG EC tablet Take 1 tablet (81 mg total) by mouth daily. 30 tablet 0  . carvedilol (COREG) 25 MG tablet TAKE 1 TABLET(25 MG) BY MOUTH TWICE DAILY WITH A MEAL 60 tablet 2  . cetirizine (ZYRTEC) 10 MG tablet Take 10 mg by mouth 2 (two) times daily.    . isosorbide mononitrate (IMDUR) 60 MG 24 hr tablet Take  1 tablet (60 mg total) by mouth daily. 90 tablet 3  . montelukast (SINGULAIR) 10 MG tablet Take 10 mg by mouth 2 (two) times daily.    . nitroGLYCERIN (NITROSTAT) 0.4 MG SL tablet PLACE 1 TABLET UNDER THE TONGUE EVERY 5 MINUTES AS NEEDED FOR CHEST PAIN 25 tablet 3  . Potassium Chloride CR (MICRO-K) 8 MEQ CPCR capsule CR Take 1 capsule (8 mEq total) by mouth daily. 90 capsule 3  . ranitidine (ZANTAC) 150 MG capsule Take 150 mg by mouth daily.    . rosuvastatin (CRESTOR) 40 MG tablet TAKE 1 TABLET BY MOUTH EVERY DAY 45 tablet 2  . VITAMIN D, CHOLECALCIFEROL, PO Take 1 tablet by mouth.     No current facility-administered medications for this visit.    Allergies:   Losartan    Social History:  The patient  reports that he quit  smoking about 21 years ago. His smoking use included cigarettes. He has a 30.00 pack-year smoking history. He has never used smokeless tobacco. He reports that he does not drink alcohol and does not use drugs.   Family History:  The patient's family history includes Arthritis in his father; CAD in his maternal aunt and mother; Hypertension in his father, mother, and another family member.  He indicated that his mother is deceased. He indicated that his father is deceased. He indicated that the status of his maternal aunt is unknown. He indicated that the status of his other is unknown.   ROS:  Please see the history of present illness. All other systems are reviewed and negative.    PHYSICAL EXAM: VS:  BP (!) 152/86   Pulse 87   Ht 5\' 9"  (1.753 m)   Wt 252 lb (114.3 kg)   SpO2 90%   BMI 37.21 kg/m  , BMI Body mass index is 37.21 kg/m. GEN: Well nourished, well developed, male in no acute distress HEENT: normal for age  Neck: no JVD, no carotid bruit, no masses Cardiac: RRR; no murmur, no rubs, or gallops Respiratory:  clear to auscultation bilaterally, normal work of breathing GI: soft, nontender, nondistended, + BS MS: no deformity or atrophy; no edema; distal pulses are 2+ in all 4 extremities  Skin: warm and dry, no rash Neuro:  Strength and sensation are intact Psych: euthymic mood, full affect   EKG:  EKG is ordered today. The ekg ordered today demonstrates SR, LBBB is old, QRS duration 154 ms, no change from 08/2019  CATH: 08/27/2016  Ost LAD to Prox LAD stent, 10 %stenosed.  Dist RCA Stent (Resolute DES 2.5 mg x 14 mm), 100 %stenosed. Fills via left to right collaterals  ____________CULPRIT______________________  Mid Cx lesion, 90 %stenosed after previous stent  A STENT RESOLUTE ONYX 3.0X18 drug eluting stent was successfully placed, and overlaps previously placed stent.  Post intervention, there is a 0% residual  stenosis.  __________________________________________  The left ventricular ejection fraction is 50-55% by visual estimate.  LV end diastolic pressure is moderately elevated.   Assessment difficult PCI of mid circumflex stent overlapping the previous he placed proximal stent. Chronically occluded distal RCA stent with brisk left left right collaterals. Patent LAD stent Intervention     Recent Labs: 09/07/2019: ALT 12; BUN 13; Creatinine, Ser 0.88; Hemoglobin 13.9; Platelets 161; Potassium 3.8; Sodium 139; TSH 2.960  CBC    Component Value Date/Time   WBC 6.1 09/07/2019 1648   WBC 5.7 08/28/2016 0333   RBC 5.24 09/07/2019 1648   RBC 5.02 08/28/2016  0333   HGB 13.9 09/07/2019 1648   HCT 41.9 09/07/2019 1648   PLT 161 09/07/2019 1648   MCV 80 09/07/2019 1648   MCH 26.5 (L) 09/07/2019 1648   MCH 25.5 (L) 08/28/2016 0333   MCHC 33.2 09/07/2019 1648   MCHC 31.6 08/28/2016 0333   RDW 13.9 09/07/2019 1648   LYMPHSABS 1.5 09/07/2019 1648   MONOABS 570 08/16/2016 0907   EOSABS 0.4 09/07/2019 1648   BASOSABS 0.1 09/07/2019 1648   CMP Latest Ref Rng & Units 09/07/2019 12/07/2016 08/28/2016  Glucose 65 - 99 mg/dL 101(H) 114(H) 106(H)  BUN 8 - 27 mg/dL 13 11 10   Creatinine 0.76 - 1.27 mg/dL 0.88 0.96 0.92  Sodium 134 - 144 mmol/L 139 140 138  Potassium 3.5 - 5.2 mmol/L 3.8 4.1 3.5  Chloride 96 - 106 mmol/L 104 105 105  CO2 20 - 29 mmol/L 25 26 26   Calcium 8.6 - 10.2 mg/dL 8.6 8.6 8.6(L)  Total Protein 6.0 - 8.5 g/dL 7.6 7.0 -  Total Bilirubin 0.0 - 1.2 mg/dL 0.4 0.8 -  Alkaline Phos 39 - 117 IU/L 147(H) 113 -  AST 0 - 40 IU/L 18 14 -  ALT 0 - 44 IU/L 12 10 -     Lipid Panel Lab Results  Component Value Date   CHOL 123 12/07/2016   HDL 26 (L) 12/07/2016   LDLCALC 79 12/07/2016   LDLDIRECT 199.5 07/25/2012   TRIG 92 12/07/2016   CHOLHDL 4.7 12/07/2016      Wt Readings from Last 3 Encounters:  05/19/20 252 lb (114.3 kg)  10/31/19 254 lb 4 oz (115.3 kg)  09/07/19 257  lb 6.4 oz (116.8 kg)     Other studies Reviewed: Additional studies/ records that were reviewed today include: Office notes, hospital records and testing.  ASSESSMENT AND PLAN:  1.  USAP - chest pain has increased in frequency and severity - he is on Imdur 60 mg qd plus amlodipine 10 mg qd, Coreg 25 mg bid - w/ pt history, he is at high risk to have progression of CAD - Spoke w/ Dr Martinique, he recommends cath - Cardiac catheterization was discussed with the patient fully. The patient understands that risks include but are not limited to stroke (1 in 1000), death (1 in 49), kidney failure [usually temporary] (1 in 500), bleeding (1 in 200), allergic reaction [possibly serious] (1 in 200).  The patient understands and is willing to proceed.   - tentatively scheduled for Tuesday, pt wishes to speak with his wife.  - May have to move till Wednesday, he or she will let us know - ck CBC, CMET, TSH, A1c  2. Elevated PSA - no recent check, do today  3. Hyperlipidemia, goal HDL < 70 - continue statin - ck profile and LFTs   4. HTN - suboptimal control, despite high dose amlodipine and Coreg - no ACE/ARB due to angioedema w/ losartan - can add hydralazine, diuretic or spiro for better control - decision can be made once anatomy is known   Current medicines are reviewed at length with the patient today.  The patient does not have concerns regarding medicines.  The following changes have been made:  no change  Labs/ tests ordered today include:   Orders Placed This Encounter  Procedures  . Lipid panel  . CBC  . Comprehensive metabolic panel  . PSA  . TSH  . Hemoglobin A1c  . EKG 12-Lead     Disposition:   FU  with Peter Martinique, MD  Signed, Rosaria Ferries, PA-C  05/19/2020 12:47 PM    [facility] Phone: [phone]; Fax: [phone]

## 2020-05-18 NOTE — H&P (View-Only) (Signed)
Cardiology Office Note   Date:  05/19/2020   ID:  Anthony Chapman, DOB 09-02-1950, MRN [id number]  PCP:  Anthony Anger, MD Cardiologist:  Anthony Martinique, MD 11/17/2018 televisit A. Leonides Sake, NP 09/07/2019 Electrphysiologist: None Rosaria Ferries, PA-C   No chief complaint on file.   History of Present Illness: Anthony Chapman is a 69 y.o. male with a history of DES RCA 2010. Cath in 2015 w/ DES RCA & CFX, cath 2018 w DES CFX,  Also had 100% ISR dRCA with left-to-right collaterals, 10% ostial LAD in-stent restenosis. HTN, HLD, OA, ED, tob use, anxiety  01/29 office visit, BP on the high side, keep BP diary, increase activity, ck labs, angina was stable  Anthony Chapman presents for cardiology follow up.  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   He was mowing but had to quit because he would have CP with this.   He gets chest pain when he is under emotional stress, or when he has to walk a longer distance than usual. He wonders if the CP is related to his BP going up from doing something unfamiliar. He will sit and rest for a few minutes, then keep going. He has had chest pain 5-7 times in the last week.   This is more than it used to be. He is compliant w/ his medications. He has not had to take nitro, symptoms are relieved by rest. No chest pain that started at rest.   He has been following his BP, DBP usually in the 80s, SBP 135-148.    Past Medical History:  Diagnosis Date  . Anginal pain ([place])   . Arthritis    "all over" (08/27/2016)  . CAD (coronary artery disease)    a.  stent placement to the RCA in 2010 b. cath in 2015 with PCI to the RCA (2.6mm x46mm Resolute DES) and LCx (3.67mm x56mm Resolute DES).  c. 08/2016: cath showing 100% ISR of d-RCA with collaterals, 10% Ost LAD ISR, 90% mid-Cx stenosis (3.0x66mm Resolute DES placed)  . ED (erectile dysfunction)   . GERD (gastroesophageal reflux disease)     . HTN (hypertension)   . Hyperlipemia   . Myocardial infarction ([place]) 2010; 2015    Past Surgical History:  Procedure Laterality Date  . CARDIAC CATHETERIZATION  09/2013  . CARDIAC CATHETERIZATION N/A 08/27/2016   Procedure: Left Heart Cath and Coronary Angiography;  Surgeon: Leonie Man, MD;  Location: [facility];  Service: Cardiovascular;  Laterality: N/A;  . CARDIAC CATHETERIZATION N/A 08/27/2016   Procedure: Coronary Stent Intervention;  Surgeon: Leonie Man, MD;  Location: [facility];  Service: Cardiovascular;  Laterality: N/A;  . CORONARY ANGIOPLASTY WITH STENT PLACEMENT  2010; 1/192018  . HERNIA REPAIR    . INGUINAL HERNIA REPAIR Left   . UMBILICAL HERNIA REPAIR      Current Outpatient Medications  Medication Sig Dispense Refill  . amLODipine (NORVASC) 10 MG tablet TAKE 1 TABLET(10 MG) BY MOUTH DAILY 90 tablet 1  . aspirin 81 MG EC tablet Take 1 tablet (81 mg total) by mouth daily. 30 tablet 0  . carvedilol (COREG) 25 MG tablet TAKE 1 TABLET(25 MG) BY MOUTH TWICE DAILY WITH A MEAL 60 tablet 2  . cetirizine (ZYRTEC) 10 MG tablet Take 10 mg by mouth 2 (two) times daily.    . isosorbide mononitrate (IMDUR) 60 MG 24 hr tablet  Take 1 tablet (60 mg total) by mouth daily. 90 tablet 3  . montelukast (SINGULAIR) 10 MG tablet Take 10 mg by mouth 2 (two) times daily.    . nitroGLYCERIN (NITROSTAT) 0.4 MG SL tablet PLACE 1 TABLET UNDER THE TONGUE EVERY 5 MINUTES AS NEEDED FOR CHEST PAIN 25 tablet 3  . Potassium Chloride CR (MICRO-K) 8 MEQ CPCR capsule CR Take 1 capsule (8 mEq total) by mouth daily. 90 capsule 3  . ranitidine (ZANTAC) 150 MG capsule Take 150 mg by mouth daily.    . rosuvastatin (CRESTOR) 40 MG tablet TAKE 1 TABLET BY MOUTH EVERY DAY 45 tablet 2  . VITAMIN D, CHOLECALCIFEROL, PO Take 1 tablet by mouth.     No current facility-administered medications for this visit.    Allergies:   Losartan    Social History:  The patient  reports that he quit  smoking about 21 years ago. His smoking use included cigarettes. He has a 30.00 pack-year smoking history. He has never used smokeless tobacco. He reports that he does not drink alcohol and does not use drugs.   Family History:  The patient's family history includes Arthritis in his father; CAD in his maternal aunt and mother; Hypertension in his father, mother, and another family member.  He indicated that his mother is deceased. He indicated that his father is deceased. He indicated that the status of his maternal aunt is unknown. He indicated that the status of his other is unknown.   ROS:  Please see the history of present illness. All other systems are reviewed and negative.    PHYSICAL EXAM: VS:  BP (!) 152/86   Pulse 87   Ht 5\' 9"  (1.753 m)   Wt 252 lb (114.3 kg)   SpO2 90%   BMI 37.21 kg/m  , BMI Body mass index is 37.21 kg/m. GEN: Well nourished, well developed, male in no acute distress HEENT: normal for age  Neck: no JVD, no carotid bruit, no masses Cardiac: RRR; no murmur, no rubs, or gallops Respiratory:  clear to auscultation bilaterally, normal work of breathing GI: soft, nontender, nondistended, + BS MS: no deformity or atrophy; no edema; distal pulses are 2+ in all 4 extremities  Skin: warm and dry, no rash Neuro:  Strength and sensation are intact Psych: euthymic mood, full affect   EKG:  EKG is ordered today. The ekg ordered today demonstrates SR, LBBB is old, QRS duration 154 ms, no change from 08/2019  CATH: 08/27/2016  Ost LAD to Prox LAD stent, 10 %stenosed.  Dist RCA Stent (Resolute DES 2.5 mg x 14 mm), 100 %stenosed. Fills via left to right collaterals  ____________CULPRIT______________________  Mid Cx lesion, 90 %stenosed after previous stent  A STENT RESOLUTE ONYX 3.0X18 drug eluting stent was successfully placed, and overlaps previously placed stent.  Post intervention, there is a 0% residual  stenosis.  __________________________________________  The left ventricular ejection fraction is 50-55% by visual estimate.  LV end diastolic pressure is moderately elevated.   Assessment difficult PCI of mid circumflex stent overlapping the previous he placed proximal stent. Chronically occluded distal RCA stent with brisk left left right collaterals. Patent LAD stent Intervention     Recent Labs: 09/07/2019: ALT 12; BUN 13; Creatinine, Ser 0.88; Hemoglobin 13.9; Platelets 161; Potassium 3.8; Sodium 139; TSH 2.960  CBC    Component Value Date/Time   WBC 6.1 09/07/2019 1648   WBC 5.7 08/28/2016 0333   RBC 5.24 09/07/2019 1648   RBC 5.02  08/28/2016 0333   HGB 13.9 09/07/2019 1648   HCT 41.9 09/07/2019 1648   PLT 161 09/07/2019 1648   MCV 80 09/07/2019 1648   MCH 26.5 (L) 09/07/2019 1648   MCH 25.5 (L) 08/28/2016 0333   MCHC 33.2 09/07/2019 1648   MCHC 31.6 08/28/2016 0333   RDW 13.9 09/07/2019 1648   LYMPHSABS 1.5 09/07/2019 1648   MONOABS 570 08/16/2016 0907   EOSABS 0.4 09/07/2019 1648   BASOSABS 0.1 09/07/2019 1648   CMP Latest Ref Rng & Units 09/07/2019 12/07/2016 08/28/2016  Glucose 65 - 99 mg/dL 101(H) 114(H) 106(H)  BUN 8 - 27 mg/dL 13 11 10   Creatinine 0.76 - 1.27 mg/dL 0.88 0.96 0.92  Sodium 134 - 144 mmol/L 139 140 138  Potassium 3.5 - 5.2 mmol/L 3.8 4.1 3.5  Chloride 96 - 106 mmol/L 104 105 105  CO2 20 - 29 mmol/L 25 26 26   Calcium 8.6 - 10.2 mg/dL 8.6 8.6 8.6(L)  Total Protein 6.0 - 8.5 g/dL 7.6 7.0 -  Total Bilirubin 0.0 - 1.2 mg/dL 0.4 0.8 -  Alkaline Phos 39 - 117 IU/L 147(H) 113 -  AST 0 - 40 IU/L 18 14 -  ALT 0 - 44 IU/L 12 10 -     Lipid Panel Lab Results  Component Value Date   CHOL 123 12/07/2016   HDL 26 (L) 12/07/2016   LDLCALC 79 12/07/2016   LDLDIRECT 199.5 07/25/2012   TRIG 92 12/07/2016   CHOLHDL 4.7 12/07/2016      Wt Readings from Last 3 Encounters:  05/19/20 252 lb (114.3 kg)  10/31/19 254 lb 4 oz (115.3 kg)  09/07/19 257  lb 6.4 oz (116.8 kg)     Other studies Reviewed: Additional studies/ records that were reviewed today include: Office notes, hospital records and testing.  ASSESSMENT AND PLAN:  1.  USAP - chest pain has increased in frequency and severity - he is on Imdur 60 mg qd plus amlodipine 10 mg qd, Coreg 25 mg bid - w/ pt history, he is at high risk to have progression of CAD - Spoke w/ Dr Chapman, he recommends cath - Cardiac catheterization was discussed with the patient fully. The patient understands that risks include but are not limited to stroke (1 in 1000), death (1 in 67), kidney failure [usually temporary] (1 in 500), bleeding (1 in 200), allergic reaction [possibly serious] (1 in 200).  The patient understands and is willing to proceed.   - tentatively scheduled for Tuesday, pt wishes to speak with his wife.  - May have to move till Wednesday, he or she will let us know - ck CBC, CMET, TSH, A1c  2. Elevated PSA - no recent check, do today  3. Hyperlipidemia, goal HDL < 70 - continue statin - ck profile and LFTs   4. HTN - suboptimal control, despite high dose amlodipine and Coreg - no ACE/ARB due to angioedema w/ losartan - can add hydralazine, diuretic or spiro for better control - decision can be made once anatomy is known   Current medicines are reviewed at length with the patient today.  The patient does not have concerns regarding medicines.  The following changes have been made:  no change  Labs/ tests ordered today include:   Orders Placed This Encounter  Procedures  . Lipid panel  . CBC  . Comprehensive metabolic panel  . PSA  . TSH  . Hemoglobin A1c  . EKG 12-Lead     Disposition:  FU with Anthony Martinique, MD  Signed, Rosaria Ferries, PA-C  05/19/2020 12:47 PM    [facility] Phone: [phone]; Fax: [phone]

## 2020-05-19 ENCOUNTER — Encounter: Payer: Self-pay | Admitting: Physician Assistant

## 2020-05-19 ENCOUNTER — Other Ambulatory Visit: Payer: Self-pay

## 2020-05-19 ENCOUNTER — Ambulatory Visit (INDEPENDENT_AMBULATORY_CARE_PROVIDER_SITE_OTHER): Payer: Medicare Other | Admitting: Physician Assistant

## 2020-05-19 ENCOUNTER — Other Ambulatory Visit (HOSPITAL_COMMUNITY)
Admission: RE | Admit: 2020-05-19 | Discharge: 2020-05-19 | Disposition: A | Discharge status: Home / Self Care | Payer: Medicare Other | Source: Ambulatory Visit | Attending: Cardiology | Admitting: Cardiology

## 2020-05-19 VITALS — BP 152/86 | HR 87 | Ht 69.0 in | Wt 252.0 lb

## 2020-05-19 DIAGNOSIS — Z01812 Encounter for preprocedural laboratory examination: Secondary | ICD-10-CM | POA: Diagnosis present

## 2020-05-19 DIAGNOSIS — I10 Essential (primary) hypertension: Secondary | ICD-10-CM | POA: Diagnosis not present

## 2020-05-19 DIAGNOSIS — Z20822 Contact with and (suspected) exposure to covid-19: Secondary | ICD-10-CM | POA: Insufficient documentation

## 2020-05-19 DIAGNOSIS — R972 Elevated prostate specific antigen [PSA]: Secondary | ICD-10-CM | POA: Diagnosis not present

## 2020-05-19 DIAGNOSIS — I200 Unstable angina: Secondary | ICD-10-CM | POA: Diagnosis not present

## 2020-05-19 DIAGNOSIS — E785 Hyperlipidemia, unspecified: Secondary | ICD-10-CM

## 2020-05-19 LAB — SARS CORONAVIRUS 2 (TAT 6-24 HRS): SARS Coronavirus 2: NEGATIVE

## 2020-05-19 NOTE — Patient Instructions (Signed)
    Anthony Chapman [place] [address] Dept: [phone] Loc: [phone]  Anthony Chapman  05/19/2020  You are scheduled for a Cardiac Catheterization on Wednesday, October 13 with Dr. Peter Martinique.  1. Please arrive at the [facility] (Main Entrance A) at [facility]: [address] [name], [place] [address] at 8:30 AM (This time is two hours before your procedure to ensure your preparation). Free valet parking service is available.   Special note: Every effort is made to have your procedure done on time. Please understand that emergencies sometimes delay scheduled procedures.  2. Diet: Do not eat solid foods after midnight.  The patient may have clear liquids until 5am upon the day of the procedure.  3. Labs: You will need to have blood drawn Today  4. Medication instructions in preparation for your procedure:   On the morning of your procedure, take your Aspirin and any morning medicines NOT listed above.  You may use sips of water.  5. Plan for one night stay--bring personal belongings. 6. Bring a current list of your medications and current insurance cards. 7. You MUST have a responsible person to drive you home. 8. Someone MUST be with you the first 24 hours after you arrive home or your discharge will be delayed. 9. Please wear clothes that are easy to get on and off and wear slip-on shoes.  Thank you for allowing [place] to care for you!   -- [facility]   For Covid Testing Today at 1:20 pm [address] [place]

## 2020-05-20 ENCOUNTER — Other Ambulatory Visit: Payer: Self-pay

## 2020-05-20 ENCOUNTER — Encounter (HOSPITAL_COMMUNITY): Payer: Self-pay | Admitting: Cardiology

## 2020-05-20 ENCOUNTER — Encounter (HOSPITAL_COMMUNITY)
Admission: RE | Disposition: A | Discharge status: Home / Self Care | Payer: Self-pay | Source: Home / Self Care | Attending: Cardiology

## 2020-05-20 ENCOUNTER — Ambulatory Visit (HOSPITAL_COMMUNITY)
Admission: RE | Admit: 2020-05-20 | Discharge: 2020-05-20 | Disposition: A | Discharge status: Home / Self Care | Payer: Medicare Other | Attending: Cardiology | Admitting: Cardiology

## 2020-05-20 DIAGNOSIS — I252 Old myocardial infarction: Secondary | ICD-10-CM | POA: Insufficient documentation

## 2020-05-20 DIAGNOSIS — Z955 Presence of coronary angioplasty implant and graft: Secondary | ICD-10-CM | POA: Diagnosis not present

## 2020-05-20 DIAGNOSIS — E785 Hyperlipidemia, unspecified: Secondary | ICD-10-CM | POA: Diagnosis not present

## 2020-05-20 DIAGNOSIS — I200 Unstable angina: Secondary | ICD-10-CM

## 2020-05-20 DIAGNOSIS — F419 Anxiety disorder, unspecified: Secondary | ICD-10-CM | POA: Insufficient documentation

## 2020-05-20 DIAGNOSIS — G4733 Obstructive sleep apnea (adult) (pediatric): Secondary | ICD-10-CM | POA: Diagnosis present

## 2020-05-20 DIAGNOSIS — I25110 Atherosclerotic heart disease of native coronary artery with unstable angina pectoris: Secondary | ICD-10-CM | POA: Insufficient documentation

## 2020-05-20 DIAGNOSIS — K219 Gastro-esophageal reflux disease without esophagitis: Secondary | ICD-10-CM | POA: Diagnosis not present

## 2020-05-20 DIAGNOSIS — Z79899 Other long term (current) drug therapy: Secondary | ICD-10-CM | POA: Diagnosis not present

## 2020-05-20 DIAGNOSIS — I25119 Atherosclerotic heart disease of native coronary artery with unspecified angina pectoris: Secondary | ICD-10-CM

## 2020-05-20 DIAGNOSIS — I700 Atherosclerosis of aorta: Secondary | ICD-10-CM | POA: Diagnosis present

## 2020-05-20 DIAGNOSIS — I10 Essential (primary) hypertension: Secondary | ICD-10-CM | POA: Diagnosis not present

## 2020-05-20 DIAGNOSIS — I2510 Atherosclerotic heart disease of native coronary artery without angina pectoris: Secondary | ICD-10-CM

## 2020-05-20 DIAGNOSIS — Z7982 Long term (current) use of aspirin: Secondary | ICD-10-CM | POA: Insufficient documentation

## 2020-05-20 DIAGNOSIS — Z87891 Personal history of nicotine dependence: Secondary | ICD-10-CM | POA: Insufficient documentation

## 2020-05-20 HISTORY — PX: LEFT HEART CATH AND CORONARY ANGIOGRAPHY: CATH118249

## 2020-05-20 LAB — CBC
Hematocrit: 39.9 % (ref 37.5–51.0)
Hemoglobin: 13.1 g/dL (ref 13.0–17.7)
MCH: 26.3 pg — ABNORMAL LOW (ref 26.6–33.0)
MCHC: 32.8 g/dL (ref 31.5–35.7)
MCV: 80 fL (ref 79–97)
Platelets: 159 10*3/uL (ref 150–450)
RBC: 4.99 x10E6/uL (ref 4.14–5.80)
RDW: 13.7 % (ref 11.6–15.4)
WBC: 5.4 10*3/uL (ref 3.4–10.8)

## 2020-05-20 LAB — COMPREHENSIVE METABOLIC PANEL
ALT: 12 IU/L (ref 0–44)
AST: 16 IU/L (ref 0–40)
Albumin/Globulin Ratio: 1.1 — ABNORMAL LOW (ref 1.2–2.2)
Albumin: 3.8 g/dL (ref 3.8–4.8)
Alkaline Phosphatase: 139 IU/L — ABNORMAL HIGH (ref 44–121)
BUN/Creatinine Ratio: 11 (ref 10–24)
BUN: 9 mg/dL (ref 8–27)
Bilirubin Total: 0.4 mg/dL (ref 0.0–1.2)
CO2: 26 mmol/L (ref 20–29)
Calcium: 8.5 mg/dL — ABNORMAL LOW (ref 8.6–10.2)
Chloride: 100 mmol/L (ref 96–106)
Creatinine, Ser: 0.8 mg/dL (ref 0.76–1.27)
GFR calc Af Amer: 106 mL/min/{1.73_m2} (ref >59)
GFR calc non Af Amer: 92 mL/min/{1.73_m2} (ref >59)
Globulin, Total: 3.5 g/dL (ref 1.5–4.5)
Glucose: 129 mg/dL — ABNORMAL HIGH (ref 65–99)
Potassium: 3.5 mmol/L (ref 3.5–5.2)
Sodium: 141 mmol/L (ref 134–144)
Total Protein: 7.3 g/dL (ref 6.0–8.5)

## 2020-05-20 LAB — PSA: Prostate Specific Ag, Serum: 4.3 ng/mL — ABNORMAL HIGH (ref 0.0–4.0)

## 2020-05-20 LAB — HEMOGLOBIN A1C
Est. average glucose Bld gHb Est-mCnc: 140 mg/dL
Hgb A1c MFr Bld: 6.5 % — ABNORMAL HIGH (ref 4.8–5.6)

## 2020-05-20 LAB — TSH: TSH: 2.98 u[IU]/mL (ref 0.450–4.500)

## 2020-05-20 LAB — LIPID PANEL
Chol/HDL Ratio: 7.2 ratio — ABNORMAL HIGH (ref 0.0–5.0)
Cholesterol, Total: 202 mg/dL — ABNORMAL HIGH (ref 100–199)
HDL: 28 mg/dL — ABNORMAL LOW (ref >39)
LDL Chol Calc (NIH): 143 mg/dL — ABNORMAL HIGH (ref 0–99)
Triglycerides: 172 mg/dL — ABNORMAL HIGH (ref 0–149)
VLDL Cholesterol Cal: 31 mg/dL (ref 5–40)

## 2020-05-20 SURGERY — LEFT HEART CATH AND CORONARY ANGIOGRAPHY
Anesthesia: LOCAL

## 2020-05-20 MED ORDER — FENTANYL CITRATE (PF) 100 MCG/2ML IJ SOLN
INTRAMUSCULAR | Status: AC
Start: 2020-05-20 — End: ?
  Filled 2020-05-20: qty 2

## 2020-05-20 MED ORDER — HEPARIN (PORCINE) IN NACL 1000-0.9 UT/500ML-% IV SOLN
INTRAVENOUS | Status: DC | PRN
  Administered 2020-05-20 (×2): 500 mL

## 2020-05-20 MED ORDER — ASPIRIN 81 MG PO CHEW
81.0000 mg | CHEWABLE_TABLET | ORAL | Status: AC
  Administered 2020-05-20: 81 mg via ORAL
  Filled 2020-05-20: qty 1

## 2020-05-20 MED ORDER — SODIUM CHLORIDE 0.9 % WEIGHT BASED INFUSION
1.0000 mL/kg/h | INTRAVENOUS | Status: DC
  Administered 2020-05-20: 1 mL/kg/h via INTRAVENOUS

## 2020-05-20 MED ORDER — HYDRALAZINE HCL 20 MG/ML IJ SOLN: 10.0000 mg | INTRAMUSCULAR | Status: DC | PRN

## 2020-05-20 MED ORDER — SODIUM CHLORIDE 0.9 % WEIGHT BASED INFUSION: 1.0000 mL/kg/h | INTRAVENOUS | Status: AC

## 2020-05-20 MED ORDER — SODIUM CHLORIDE 0.9 % IV SOLN: 250.0000 mL | INTRAVENOUS | Status: DC | PRN

## 2020-05-20 MED ORDER — LIDOCAINE HCL (PF) 1 % IJ SOLN
INTRAMUSCULAR | Status: AC
  Filled 2020-05-20: qty 30

## 2020-05-20 MED ORDER — HEPARIN (PORCINE) IN NACL 1000-0.9 UT/500ML-% IV SOLN
INTRAVENOUS | Status: AC
  Filled 2020-05-20: qty 1000

## 2020-05-20 MED ORDER — SODIUM CHLORIDE 0.9% FLUSH: 3.0000 mL | INTRAVENOUS | Status: DC | PRN

## 2020-05-20 MED ORDER — LIDOCAINE HCL (PF) 1 % IJ SOLN
INTRAMUSCULAR | Status: DC | PRN
  Administered 2020-05-20: 2 mL

## 2020-05-20 MED ORDER — FENTANYL CITRATE (PF) 100 MCG/2ML IJ SOLN
INTRAMUSCULAR | Status: DC | PRN
Start: 2020-05-20 — End: 2020-05-20
  Administered 2020-05-20: 25 ug via INTRAVENOUS

## 2020-05-20 MED ORDER — MIDAZOLAM HCL 2 MG/2ML IJ SOLN
INTRAMUSCULAR | Status: DC | PRN
  Administered 2020-05-20: 1 mg via INTRAVENOUS

## 2020-05-20 MED ORDER — ONDANSETRON HCL 4 MG/2ML IJ SOLN: 4.0000 mg | Freq: Four times a day (QID) | INTRAMUSCULAR | Status: DC | PRN

## 2020-05-20 MED ORDER — HEPARIN SODIUM (PORCINE) 1000 UNIT/ML IJ SOLN
INTRAMUSCULAR | Status: AC
  Filled 2020-05-20: qty 1

## 2020-05-20 MED ORDER — ACETAMINOPHEN 325 MG PO TABS: 650.0000 mg | ORAL_TABLET | ORAL | Status: DC | PRN

## 2020-05-20 MED ORDER — HEPARIN SODIUM (PORCINE) 1000 UNIT/ML IJ SOLN
INTRAMUSCULAR | Status: DC | PRN
  Administered 2020-05-20: 5500 [IU] via INTRAVENOUS

## 2020-05-20 MED ORDER — SODIUM CHLORIDE 0.9% FLUSH: 3.0000 mL | Freq: Two times a day (BID) | INTRAVENOUS | Status: DC

## 2020-05-20 MED ORDER — VERAPAMIL HCL 2.5 MG/ML IV SOLN
INTRAVENOUS | Status: DC | PRN
  Administered 2020-05-20: 10 mL via INTRA_ARTERIAL

## 2020-05-20 MED ORDER — MIDAZOLAM HCL 2 MG/2ML IJ SOLN
INTRAMUSCULAR | Status: AC
  Filled 2020-05-20: qty 2

## 2020-05-20 MED ORDER — VERAPAMIL HCL 2.5 MG/ML IV SOLN
INTRAVENOUS | Status: AC
  Filled 2020-05-20: qty 2

## 2020-05-20 MED ORDER — SODIUM CHLORIDE 0.9 % WEIGHT BASED INFUSION
3.0000 mL/kg/h | INTRAVENOUS | Status: AC
  Administered 2020-05-20: 3 mL/kg/h via INTRAVENOUS

## 2020-05-20 MED ORDER — IOHEXOL 350 MG/ML SOLN
INTRAVENOUS | Status: DC | PRN
  Administered 2020-05-20: 70 mL

## 2020-05-20 SURGICAL SUPPLY — 10 items
CATH 5FR JL3.5 JR4 ANG PIG MP (CATHETERS) ×1 IMPLANT
DEVICE RAD COMP TR BAND LRG (VASCULAR PRODUCTS) ×1 IMPLANT
GLIDESHEATH SLEND SS 6F .021 (SHEATH) ×1 IMPLANT
GUIDEWIRE INQWIRE 1.5J.035X260 (WIRE) IMPLANT
INQWIRE 1.5J .035X260CM (WIRE) ×2
KIT HEART LEFT (KITS) ×2 IMPLANT
PACK CARDIAC CATHETERIZATION (CUSTOM PROCEDURE TRAY) ×2 IMPLANT
SYR MEDRAD MARK 7 150ML (SYRINGE) ×2 IMPLANT
TRANSDUCER W/STOPCOCK (MISCELLANEOUS) ×2 IMPLANT
TUBING CIL FLEX 10 FLL-RA (TUBING) ×2 IMPLANT

## 2020-05-20 NOTE — Progress Notes (Signed)
Patient was given discharge instructions. He verbalized understanding. 

## 2020-05-20 NOTE — Discharge Instructions (Signed)
Drink plenty of fluid for 48 hours and keep wrist elevated at heart level for 24 hours  Radial Site Care   This sheet gives you information about how to care for yourself after your procedure. Your health care provider may also give you more specific instructions. If you have problems or questions, contact your health care provider. What can I expect after the procedure? After the procedure, it is common to have:  Bruising and tenderness at the catheter insertion area. Follow these instructions at home: Medicines  Take over-the-counter and prescription medicines only as told by your health care provider. Insertion site care 1. Follow instructions from your health care provider about how to take care of your insertion site. Make sure you: ? Wash your hands with soap and water before you change your bandage (dressing). If soap and water are not available, use hand sanitizer. ? remove your dressing as told by your health care provider. In 24 hours 2. Check your insertion site every day for signs of infection. Check for: ? Redness, swelling, or pain. ? Fluid or blood. ? Pus or a bad smell. ? Warmth. 3. Do not take baths, swim, or use a hot tub until your health care provider approves. 4. You may shower 24-48 hours after the procedure, or as directed by your health care provider. ? Remove the dressing and gently wash the site with plain soap and water. ? Pat the area dry with a clean towel. ? Do not rub the site. That could cause bleeding. 5. Do not apply powder or lotion to the site. Activity   1. For 24 hours after the procedure, or as directed by your health care provider: ? Do not flex or bend the affected arm. ? Do not push or pull heavy objects with the affected arm. ? Do not drive yourself home from the hospital or clinic. You may drive 24 hours after the procedure unless your health care provider tells you not to. ? Do not operate machinery or power tools. 2. Do not lift  anything that is heavier than 10 lb (4.5 kg), or the limit that you are told, until your health care provider says that it is safe. For 4 days 3. Ask your health care provider when it is okay to: ? Return to work or school. ? Resume usual physical activities or sports. ? Resume sexual activity. General instructions  If the catheter site starts to bleed, raise your arm and put firm pressure on the site. If the bleeding does not stop, get help right away. This is a medical emergency.  If you went home on the same day as your procedure, a responsible adult should be with you for the first 24 hours after you arrive home.  Keep all follow-up visits as told by your health care provider. This is important. Contact a health care provider if:  You have a fever.  You have redness, swelling, or yellow drainage around your insertion site. Get help right away if:  You have unusual pain at the radial site.  The catheter insertion area swells very fast.  The insertion area is bleeding, and the bleeding does not stop when you hold steady pressure on the area.  Your arm or hand becomes pale, cool, tingly, or numb. These symptoms may represent a serious problem that is an emergency. Do not wait to see if the symptoms will go away. Get medical help right away. Call your local emergency services (911 in the [place] Do not   drive yourself to the hospital. Summary  After the procedure, it is common to have bruising and tenderness at the site.  Follow instructions from your health care provider about how to take care of your radial site wound. Check the wound every day for signs of infection.  Do not lift anything that is heavier than 10 lb (4.5 kg), or the limit that you are told, until your health care provider says that it is safe. This information is not intended to replace advice given to you by your health care provider. Make sure you discuss any questions you have with your health care  provider. Document Revised: 08/31/2017 Document Reviewed: 08/31/2017 Elsevier Patient Education  2020 Elsevier Inc.  

## 2020-05-20 NOTE — Interval H&P Note (Signed)
History and Physical Interval Note:  05/20/2020 10:12 AM  Anthony Chapman  has presented today for surgery, with the diagnosis of unstable angina.  The various methods of treatment have been discussed with the patient and family. After consideration of risks, benefits and other options for treatment, the patient has consented to  Procedure(s): LEFT HEART CATH AND CORONARY ANGIOGRAPHY (N/A) as a surgical intervention.  The patient's history has been reviewed, patient examined, no change in status, stable for surgery.  I have reviewed the patient's chart and labs.  Questions were answered to the patient's satisfaction.   Cath Lab Visit (complete for each Cath Lab visit)  Clinical Evaluation Leading to the Procedure:   ACS: Yes.    Non-ACS:    Anginal Classification: CCS III  Anti-ischemic medical therapy: Maximal Therapy (2 or more classes of medications)  Non-Invasive Test Results: No non-invasive testing performed  Prior CABG: No previous CABG        [facility] 05/20/2020 10:12 AM

## 2020-06-03 ENCOUNTER — Telehealth: Payer: Self-pay | Admitting: *Deleted

## 2020-06-03 NOTE — Telephone Encounter (Signed)
Per Rosaria Ferries, PA-C-Please have him follow-up with Dr. Martinique in 3 months   Call placed to patient, appointment scheduled with Dr Martinique September 03, 2020 8:40 AM

## 2020-06-09 ENCOUNTER — Other Ambulatory Visit: Payer: Self-pay

## 2020-06-09 DIAGNOSIS — E785 Hyperlipidemia, unspecified: Secondary | ICD-10-CM

## 2020-06-09 MED ORDER — ATORVASTATIN CALCIUM 20 MG PO TABS: 20.0000 mg | ORAL_TABLET | Freq: Every day | ORAL | 1 refills | Status: DC

## 2020-06-24 ENCOUNTER — Telehealth: Payer: Self-pay | Admitting: Internal Medicine

## 2020-06-24 NOTE — Telephone Encounter (Signed)
LVM for pt to rtn my call to schedule AWV-I with [facility]. If pt calls the office please schedule this appt.    Thanks, [phone]

## 2020-06-26 ENCOUNTER — Ambulatory Visit (INDEPENDENT_AMBULATORY_CARE_PROVIDER_SITE_OTHER): Payer: Medicare Other | Admitting: Internal Medicine

## 2020-06-26 ENCOUNTER — Encounter: Payer: Self-pay | Admitting: Internal Medicine

## 2020-06-26 ENCOUNTER — Other Ambulatory Visit: Payer: Self-pay

## 2020-06-26 VITALS — BP 142/84 | HR 78 | Temp 98.8°F | Wt 245.8 lb

## 2020-06-26 DIAGNOSIS — Z0000 Encounter for general adult medical examination without abnormal findings: Secondary | ICD-10-CM | POA: Diagnosis not present

## 2020-06-26 DIAGNOSIS — I10 Essential (primary) hypertension: Secondary | ICD-10-CM

## 2020-06-26 DIAGNOSIS — R972 Elevated prostate specific antigen [PSA]: Secondary | ICD-10-CM

## 2020-06-26 DIAGNOSIS — N529 Male erectile dysfunction, unspecified: Secondary | ICD-10-CM | POA: Diagnosis not present

## 2020-06-26 DIAGNOSIS — E118 Type 2 diabetes mellitus with unspecified complications: Secondary | ICD-10-CM | POA: Insufficient documentation

## 2020-06-26 DIAGNOSIS — R739 Hyperglycemia, unspecified: Secondary | ICD-10-CM | POA: Insufficient documentation

## 2020-06-26 DIAGNOSIS — E785 Hyperlipidemia, unspecified: Secondary | ICD-10-CM

## 2020-06-26 DIAGNOSIS — Z0001 Encounter for general adult medical examination with abnormal findings: Secondary | ICD-10-CM | POA: Insufficient documentation

## 2020-06-26 DIAGNOSIS — Z1211 Encounter for screening for malignant neoplasm of colon: Secondary | ICD-10-CM

## 2020-06-26 MED ORDER — ALPROSTADIL (VASODILATOR) 10 MCG IC KIT: 10.0000 ug | PACK | INTRACAVERNOUS | 12 refills | Status: DC | PRN

## 2020-06-26 NOTE — Assessment & Plan Note (Signed)
On Lipitor 

## 2020-06-26 NOTE — Assessment & Plan Note (Signed)
Isosorbide, hyzaar, norvasc

## 2020-06-26 NOTE — Progress Notes (Signed)
Subjective:  Patient ID: Anthony Chapman, male    DOB: 12-31-1950  Age: 69 y.o. MRN: [id number]  CC: Referral (Colonoscopy)   HPI Anthony Chapman presents for a well exam F/u CAD, HTN, dyslipidemia C/o ED  Outpatient Medications Prior to Visit  Medication Sig Dispense Refill  . amLODipine (NORVASC) 10 MG tablet TAKE 1 TABLET(10 MG) BY MOUTH DAILY 90 tablet 1  . aspirin 81 MG EC tablet Take 1 tablet (81 mg total) by mouth daily. 30 tablet 0  . atorvastatin (LIPITOR) 20 MG tablet Take 1 tablet (20 mg total) by mouth daily. 90 tablet 1  . carvedilol (COREG) 25 MG tablet TAKE 1 TABLET(25 MG) BY MOUTH TWICE DAILY WITH A MEAL 60 tablet 2  . cetirizine (ZYRTEC) 10 MG tablet Take 10 mg by mouth 2 (two) times daily.    . isosorbide mononitrate (IMDUR) 60 MG 24 hr tablet Take 1 tablet (60 mg total) by mouth daily. 90 tablet 3  . montelukast (SINGULAIR) 10 MG tablet Take 10 mg by mouth 2 (two) times daily.    . nitroGLYCERIN (NITROSTAT) 0.4 MG SL tablet PLACE 1 TABLET UNDER THE TONGUE EVERY 5 MINUTES AS NEEDED FOR CHEST PAIN 25 tablet 3  . Potassium Chloride CR (MICRO-K) 8 MEQ CPCR capsule CR Take 1 capsule (8 mEq total) by mouth daily. 90 capsule 3  . ranitidine (ZANTAC) 150 MG capsule Take 150 mg by mouth daily.    Marland Kitchen VITAMIN D, CHOLECALCIFEROL, PO Take 1 tablet by mouth.     No facility-administered medications prior to visit.    ROS: Review of Systems  Constitutional: Negative for appetite change, fatigue and unexpected weight change.  HENT: Negative for congestion, nosebleeds, sneezing, sore throat and trouble swallowing.   Eyes: Negative for itching and visual disturbance.  Respiratory: Negative for cough.   Cardiovascular: Negative for chest pain, palpitations and leg swelling.  Gastrointestinal: Negative for abdominal distention, blood in stool, diarrhea and nausea.  Genitourinary: Negative for frequency and hematuria.  Musculoskeletal: Negative for back pain, gait problem, joint  swelling and neck pain.  Skin: Negative for rash.  Neurological: Negative for dizziness, tremors, speech difficulty and weakness.  Psychiatric/Behavioral: Negative for agitation, dysphoric mood and sleep disturbance. The patient is not nervous/anxious.     Objective:  BP (!) 142/84 (BP Location: Left Arm)   Pulse 78   Temp 98.8 F (37.1 C) (Oral)   Wt 245 lb 12.8 oz (111.5 kg)   SpO2 96%   BMI 36.30 kg/m   BP Readings from Last 3 Encounters:  06/26/20 (!) 142/84  05/20/20 135/83  05/19/20 (!) 152/86    Wt Readings from Last 3 Encounters:  06/26/20 245 lb 12.8 oz (111.5 kg)  05/20/20 252 lb (114.3 kg)  05/19/20 252 lb (114.3 kg)    Physical Exam Constitutional:      General: He is not in acute distress.    Appearance: He is well-developed. He is obese.     Comments: NAD  Eyes:     Conjunctiva/sclera: Conjunctivae normal.     Pupils: Pupils are equal, round, and reactive to light.  Neck:     Thyroid: No thyromegaly.     Vascular: No JVD.  Cardiovascular:     Rate and Rhythm: Normal rate and regular rhythm.     Heart sounds: Normal heart sounds. No murmur heard.  No friction rub. No gallop.   Pulmonary:     Effort: Pulmonary effort is normal. No respiratory distress.  Breath sounds: Normal breath sounds. No wheezing or rales.  Chest:     Chest wall: No tenderness.  Abdominal:     General: Bowel sounds are normal. There is no distension.     Palpations: Abdomen is soft. There is no mass.     Tenderness: There is no abdominal tenderness. There is no guarding or rebound.  Musculoskeletal:        General: No tenderness. Normal range of motion.     Cervical back: Normal range of motion.  Lymphadenopathy:     Cervical: No cervical adenopathy.  Skin:    General: Skin is warm and dry.     Findings: No rash.  Neurological:     Mental Status: He is alert and oriented to person, place, and time.     Cranial Nerves: No cranial nerve deficit.     Motor: No abnormal  muscle tone.     Coordination: Coordination normal.     Gait: Gait normal.     Deep Tendon Reflexes: Reflexes are normal and symmetric.  Psychiatric:        Behavior: Behavior normal.        Thought Content: Thought content normal.        Judgment: Judgment normal.   Rectal -- per GI  I spent 22 minutes in addition to time for CPX wellness examination in preparing to see the patient by review of recent labs, imaging and procedures, obtaining and reviewing separately obtained history, communicating with the patient, ordering medications, tests or procedures, and documenting clinical information in the EHR including the differential diagnosis, treatment, and any further evaluation and other management of ED, elevated PSA         Lab Results  Component Value Date   WBC 5.4 05/19/2020   HGB 13.1 05/19/2020   HCT 39.9 05/19/2020   PLT 159 05/19/2020   GLUCOSE 129 (H) 05/19/2020   CHOL 202 (H) 05/19/2020   TRIG 172 (H) 05/19/2020   HDL 28 (L) 05/19/2020   LDLDIRECT 199.5 07/25/2012   LDLCALC 143 (H) 05/19/2020   ALT 12 05/19/2020   AST 16 05/19/2020   NA 141 05/19/2020   K 3.5 05/19/2020   CL 100 05/19/2020   CREATININE 0.80 05/19/2020   BUN 9 05/19/2020   CO2 26 05/19/2020   TSH 2.980 05/19/2020   PSA 4.77 (H) 07/09/2016   INR 1.1 08/16/2016   HGBA1C 6.5 (H) 05/19/2020    CARDIAC CATHETERIZATION  Result Date: 05/20/2020  Prox RCA lesion is 30% stenosed.  Dist RCA lesion is 40% stenosed.  RPAV lesion is 100% stenosed.  Prox LAD to Mid LAD lesion is 25% stenosed.  Ramus lesion is 50% stenosed.  Ost Cx to Dist Cx lesion is 10% stenosed.  The left ventricular systolic function is normal.  LV end diastolic pressure is normal.  The left ventricular ejection fraction is 55-65% by visual estimate.  1. Single vessel occlusive CAD involving the PL branch of the RCA. This is well collateralized. 2. Patent stents in the LCx 3. Good LV function 4. Normal LVEDP Plan: continue  medical therapy    Assessment & Plan:    Walker Kehr, MD

## 2020-06-26 NOTE — Assessment & Plan Note (Signed)
  We discussed age appropriate health related issues, including available/recomended screening tests and vaccinations. Labs were ordered to be later reviewed . All questions were answered. We discussed one or more of the following - seat belt use, use of sunscreen/sun exposure exercise, safe sex, fall risk reduction, second hand smoke exposure, firearm use and storage, seat belt use, a need for adhering to healthy diet and exercise. Labs were ordered.  All questions were answered. Colon - ordered

## 2020-06-26 NOTE — Assessment & Plan Note (Signed)
PSA

## 2020-06-26 NOTE — Assessment & Plan Note (Signed)
Trial of Caverject

## 2020-06-30 ENCOUNTER — Other Ambulatory Visit: Payer: Self-pay | Admitting: Cardiology

## 2020-07-23 ENCOUNTER — Other Ambulatory Visit: Payer: Self-pay

## 2020-07-23 DIAGNOSIS — E785 Hyperlipidemia, unspecified: Secondary | ICD-10-CM

## 2020-08-05 ENCOUNTER — Telehealth: Payer: Self-pay | Admitting: *Deleted

## 2020-08-05 NOTE — Telephone Encounter (Signed)
John,  Due to cardiac issues past and present , can you please review this chart and note if he can have a [facility] Colon- he is scheduled for 1-26 with Cirigliano  note 05-19-2020 Cardio note and 05-20-2020 Cardiac Cath  Thank < Hilda Lias PV

## 2020-08-06 NOTE — Telephone Encounter (Signed)
Marie,  This pt is cleared for anesthetic care at [facility].  Thanks,  Randee Huston 

## 2020-08-21 ENCOUNTER — Other Ambulatory Visit: Payer: Self-pay

## 2020-08-21 ENCOUNTER — Ambulatory Visit (AMBULATORY_SURGERY_CENTER): Payer: Self-pay | Admitting: *Deleted

## 2020-08-21 VITALS — Ht 69.0 in | Wt 246.0 lb

## 2020-08-21 DIAGNOSIS — Z1211 Encounter for screening for malignant neoplasm of colon: Secondary | ICD-10-CM

## 2020-08-21 MED ORDER — SUPREP BOWEL PREP KIT 17.5-3.13-1.6 GM/177ML PO SOLN: 1.0000 | Freq: Once | ORAL | 0 refills | Status: AC

## 2020-08-21 NOTE — Progress Notes (Signed)
Pt verified name, DOB, address and insurance during PV today. Pt mailed instruction packet to included paper to complete and mail back to [facility] with addressed and stamped envelope, Emmi video, copy of consent form to read and not return, and instructions.  PV completed over the phone. Pt encouraged to call with questions or issues - My Chart instructions to pt    No egg or soy allergy known to patient  No issues with past sedation with any surgeries or procedures No intubation problems in the past  No FH of Malignant Hyperthermia No diet pills per patient No home 02 use per patient  No blood thinners per patient  Pt denies issues with constipation  No A fib or A flutter  EMMI video to pt or via [place] 19 guidelines implemented in PV today with Pt and RN  Pt is fully vaccinated  for Covid    Due to the COVID-19 pandemic we are asking patients to follow certain guidelines.  Pt aware of COVID protocols and [facility] guidelines

## 2020-08-29 ENCOUNTER — Encounter: Payer: Self-pay | Admitting: Gastroenterology

## 2020-08-31 NOTE — Progress Notes (Deleted)
Cardiology Office Note  Date: 08/31/2020   ID: Anthony Chapman, DOB 25-Jul-1951, MRN [id number]  PCP:  Cassandria Anger, MD  Cardiologist:  Maheen Cwikla Martinique, MD Electrophysiologist:  None   No chief complaint on file.   History of Present Illness: Anthony Chapman is a 70 y.o. male seen for follow up CAD.   Past medical history of coronary artery disease.  Cardiac catheterization in 2010 with DES to RCA.  Subsequent cath in 2015 with DES to RCA and left circumflex 2015.  Had a third cardiac catheterization  2018 with DES to left circumflex.  Also had 100% in-stent restenosis of distal RCA with left-to-right collaterals, 10% ostial LAD in-stent restenosis, 90% mid left circumflex stenosis treated with DES. Additional past medical history includes hyperlipidemia, hypertension, prediabetes, left bundle branch block, arthritis, erectile dysfunction, GERD, obesity, obstructive sleep apnea..  History of smoking approximately 30 years 1 pack/day.  Quit 20 years ago.  The patient was seen in October with some chest pain. Cardiac cath was performed showing patent stents with chronically occluded PL branch that was collateralized. Medical therapy recommended.    Past Medical History:  Diagnosis Date  . Allergy    Spring- mild   . Anginal pain ([place])   . Arthritis    "all over" (08/27/2016)  . CAD (coronary artery disease)    a.  stent placement to the RCA in 2010 b. cath in 2015 with PCI to the RCA (2.47m x158mResolute DES) and LCx (3.60m1m30m66msolute DES).  c. 08/2016: cath showing 100% ISR of d-RCA with collaterals, 10% Ost LAD ISR, 90% mid-Cx stenosis (3.0x18mm23molute DES placed)  . ED (erectile dysfunction)   . GERD (gastroesophageal reflux disease)   . HTN (hypertension)   . Hyperlipemia   . LBBB (left bundle branch block)   . Myocardial infarction (HCC) [place]0; 2015  . Sleep apnea    wears cpap     Past Surgical History:  Procedure Laterality Date  . CARDIAC CATHETERIZATION   09/2013  . CARDIAC CATHETERIZATION N/A 08/27/2016   Procedure: Left Heart Cath and Coronary Angiography;  Surgeon: DavidLeonie Man  [facility];  Service: Cardiovascular;  Laterality: N/A;  . CARDIAC CATHETERIZATION N/A 08/27/2016   Procedure: Coronary Stent Intervention;  Surgeon: DavidLeonie Man  [facility];  Service: Cardiovascular;  Laterality: N/A;  . COLONOSCOPY  2004  . CORONARY ANGIOPLASTY WITH STENT PLACEMENT  2010; 1/192018  . HERNIA REPAIR    . INGUINAL HERNIA REPAIR Left   . LEFT HEART CATH AND CORONARY ANGIOGRAPHY N/A 05/20/2020   Procedure: LEFT HEART CATH AND CORONARY ANGIOGRAPHY;  Surgeon: JordaMartiniqueer M, MD;  [facility];  Service: Cardiovascular;  Laterality: N/A;  . UMBILICAL HERNIA REPAIR      Current Outpatient Medications  Medication Sig Dispense Refill  . alprostadil (EDEX) 10 MCG injection 10 mcg by Intracavitary route as needed for erectile dysfunction (as directed). use no more than 3 times per week (Patient not taking: Reported on 08/21/2020) 1 each 12  . amLODipine (NORVASC) 10 MG tablet TAKE 1 TABLET(10 MG) BY MOUTH DAILY 90 tablet 1  . aspirin 81 MG EC tablet Take 1 tablet (81 mg total) by mouth daily. 30 tablet 0  . carvedilol (COREG) 25 MG tablet TAKE 1 TABLET(25 MG) BY MOUTH TWICE DAILY WITH A MEAL 60 tablet 2  . cetirizine (ZYRTEC) 10 MG tablet Take 10 mg by mouth 2 (  two) times daily.    . isosorbide mononitrate (IMDUR) 60 MG 24 hr tablet Take 1 tablet (60 mg total) by mouth daily. 90 tablet 3  . montelukast (SINGULAIR) 10 MG tablet Take 10 mg by mouth 2 (two) times daily.    . nitroGLYCERIN (NITROSTAT) 0.4 MG SL tablet DISSOLVE 1 TABLET UNDER THE TONGUE EVERY 5 MINUTES AS  NEEDED FOR CHEST PAIN. MAX  OF 3 TABLETS IN 15 MINUTES. CALL 911 IF PAIN PERSISTS. 100 tablet 3  . Potassium Chloride CR (MICRO-K) 8 MEQ CPCR capsule CR Take 1 capsule (8 mEq total) by mouth daily. 90 capsule 3  . rosuvastatin  (CRESTOR) 40 MG tablet Take 40 mg by mouth daily.    Marland Kitchen VITAMIN D, CHOLECALCIFEROL, PO Take 1 tablet by mouth.     No current facility-administered medications for this visit.   Allergies:  Losartan   Social History: The patient  reports that he has been smoking cigarettes and cigars. He has a 30.00 pack-year smoking history. He has never used smokeless tobacco. He reports current alcohol use. He reports that he does not use drugs.   Family History: The patient's family history includes Arthritis in his father; CAD in his maternal aunt and mother; Hypertension in his father, mother, and another family member.   ROS:  Please see the history of present illness. Otherwise, complete review of systems is positive for none.  All other systems are reviewed and negative.   Physical Exam: VS:  There were no vitals taken for this visit., BMI There is no height or weight on file to calculate BMI.  Wt Readings from Last 3 Encounters:  08/21/20 246 lb (111.6 kg)  06/26/20 245 lb 12.8 oz (111.5 kg)  05/20/20 252 lb (114.3 kg)    General: Obese patient appears comfortable at rest. Neck: Supple, no elevated JVP or carotid bruits, no thyromegaly. Lungs: Clear to auscultation, nonlabored breathing at rest. Cardiac: Regular rate and rhythm, no S3 or significant systolic murmur, no pericardial rub. Extremities: No pitting edema, distal pulses 2+. Skin: Warm and dry. Neuropsychiatric: Alert and oriented x3, affect grossly appropriate.  ECG:  An ECG dated September 07, 2019 was personally reviewed today and demonstrated:  Normal sinus rhythm rate of 68 left bundle branch block  Recent Labwork: 05/19/2020: ALT 12; AST 16; BUN 9; Creatinine, Ser 0.80; Hemoglobin 13.1; Platelets 159; Potassium 3.5; Sodium 141; TSH 2.980     Component Value Date/Time   CHOL 202 (H) 05/19/2020 1253   TRIG 172 (H) 05/19/2020 1253   HDL 28 (L) 05/19/2020 1253   CHOLHDL 7.2 (H) 05/19/2020 1253   CHOLHDL 4.7 12/07/2016 1516    VLDL 18 12/07/2016 1516   LDLCALC 143 (H) 05/19/2020 1253   LDLDIRECT 199.5 07/25/2012 0935    Other Studies Reviewed Today:  Cardiac catheterization August 27, 2016 Left Main  Vessel is large.  Left Anterior Descending  Ost LAD to Prox LAD lesion 10% stenosed  The lesion is smooth. The lesion was previously treated using a stent (unknown type) over 2 years ago.  First Diagonal Branch  Vessel is moderate in size. Vessel is angiographically normal.  First Septal Branch  Vessel is small in size.  Second Septal Branch  Vessel is small in size.  Third Septal Branch  Vessel is small in size.  Ramus Intermedius  Vessel is moderate in size.  Left Circumflex  Prox Cx lesion 0% stenosed  Previously placed Prox Cx drug eluting stent is widely patent. 2015  Mid  Cx lesion 90% stenosed  Culprit lesion. The lesion is located at the bend, eccentric and irregular.  First Obtuse Marginal Branch  Vessel is small in size.  Second Obtuse Marginal Branch  2nd Mrg lesion 40% stenosed  The lesion is focal, tubular and smooth. At takeoff of small side branch  Right Coronary Artery  Vessel is large.  Dist RCA lesion 100% stenosed  The lesion is chronically occluded. The lesion was previously treated using a drug eluting stent and a stent (unknown type) . Previously placed stent displays rethrombosis. Previously placed stent displays restenosis.  Acute Marginal Branch  Vessel is moderate in size.  Right Posterior Descending Artery  Vessel is moderate in size.  Inferior Septal  Vessel is small in size.  Collaterals  Inf Sept filled by collaterals from 1st Sept.    Right Posterior Atrioventricular Artery  Vessel is moderate in size.  First Right Posterolateral Branch  Vessel is moderate in size.  Second Right Posterolateral Branch  Vessel is moderate in size.  Collaterals  2nd RPL filled by collaterals from Dist Cx.    Third Right Posterolateral Branch  Vessel is small in size.   Intervention  Prox Cx lesion  Angioplasty (Also treats lesions: Mid Cx)  Lesion length: 16 mm. Lesion crossed with guidewire using a WIRE LUGE 182CM. Pre-stent angioplasty was performed using a BALLOON MOZEC 2.50X14. Maximum pressure: 12 atm. Inflation time: 20 sec. WIRE HI TORQ [place] 190CM [id number]) - initial wire A STENT RESOLUTE ONYX 3.0X18 drug eluting stent was successfully placed, and overlaps previously placed stent. Minimum lumen area: 3.5 mm. Stent strut is well apposed. Post-stent angioplasty was performed using a BALLOON [place] MOZEC 3.5X13. Maximum pressure: 16 atm. Inflation time: 20 sec. The pre-interventional distal flow is normal (TIMI 3). The post-interventional distal flow is normal (TIMI 3). No complications occurred at this lesion. GUIDE CATH RUNWAY 6FR CLS3.5 (Yes)  There is a 0% residual stenosis post intervention.  Mid Cx lesion  Angioplasty (Also treats lesions: Prox Cx)  Lesion length: 16 mm. Lesion crossed with guidewire using a WIRE LUGE 182CM. Pre-stent angioplasty was performed using a BALLOON MOZEC 2.50X14. Maximum pressure: 12 atm. Inflation time: 20 sec. WIRE HI TORQ BMW 190CM [id number]) - initial wire A STENT RESOLUTE ONYX 3.0X18 drug eluting stent was successfully placed, and overlaps previously placed stent. Minimum lumen area: 3.5 mm. Stent strut is well apposed. Post-stent angioplasty was performed using a BALLOON [place] MOZEC 3.5X13. Maximum pressure: 16 atm. Inflation time: 20 sec. The pre-interventional distal flow is normal (TIMI 3). The post-interventional distal flow is normal (TIMI 3). No complications occurred at this lesion. GUIDE CATH RUNWAY 6FR CLS3.5 (Yes)  There is a 0% residual stenosis post intervention.   Cardiac cath 05/20/20:  LEFT HEART CATH AND CORONARY ANGIOGRAPHY    Conclusion    Prox RCA lesion is 30% stenosed.  Dist RCA lesion is 40% stenosed.  RPAV lesion is 100% stenosed.  Prox LAD to Mid LAD lesion is 25% stenosed.  Ramus lesion is 50%  stenosed.  Ost Cx to Dist Cx lesion is 10% stenosed.  The left ventricular systolic function is normal.  LV end diastolic pressure is normal.  The left ventricular ejection fraction is 55-65% by visual estimate.   1. Single vessel occlusive CAD involving the PL branch of the RCA. This is well collateralized. 2. Patent stents in the LCx 3. Good LV function 4. Normal LVEDP  Plan: continue medical therapy      Assessment and  Plan:  No diagnosis found. 1. Essential hypertension   2. CAD S/P percutaneous coronary angioplasty - 2015 PCI to RCA (2.5 x 14 Resolute DES) & Cx (3 x 12 Resolute DES) RCA stent 2010.  RCA and left circumflex stent 2015.  PCI of the mid LCx in 2018. Most recent cardiac cath in October 2021 showing occluded PL branch of the RCA with collaterals. Medical management recommended.   3. OSA (obstructive sleep apnea) On CPAP and compliant.  4. HLD  Medication Adjustments/Labs and Tests Ordered: Current medicines are reviewed at length with the patient today.  Concerns regarding medicines are outlined above.    There are no Patient Instructions on file for this visit.       Signed, Levell July, NP 08/31/2020 8:52 AM    [facility] [place], [place], [place] [address] Phone: [phone]; Fax: [phone]

## 2020-09-03 ENCOUNTER — Ambulatory Visit: Payer: Medicare Other | Admitting: Cardiology

## 2020-09-03 ENCOUNTER — Encounter: Payer: Self-pay | Admitting: Gastroenterology

## 2020-09-03 ENCOUNTER — Ambulatory Visit (AMBULATORY_SURGERY_CENTER): Payer: Medicare Other | Admitting: Gastroenterology

## 2020-09-03 ENCOUNTER — Other Ambulatory Visit: Payer: Self-pay

## 2020-09-03 VITALS — BP 148/74 | HR 74 | Temp 97.8°F | Resp 16 | Ht 69.0 in | Wt 246.0 lb

## 2020-09-03 DIAGNOSIS — D120 Benign neoplasm of cecum: Secondary | ICD-10-CM | POA: Diagnosis not present

## 2020-09-03 DIAGNOSIS — K5730 Diverticulosis of large intestine without perforation or abscess without bleeding: Secondary | ICD-10-CM

## 2020-09-03 DIAGNOSIS — K640 First degree hemorrhoids: Secondary | ICD-10-CM

## 2020-09-03 DIAGNOSIS — Z1211 Encounter for screening for malignant neoplasm of colon: Secondary | ICD-10-CM | POA: Diagnosis not present

## 2020-09-03 DIAGNOSIS — D125 Benign neoplasm of sigmoid colon: Secondary | ICD-10-CM

## 2020-09-03 DIAGNOSIS — K621 Rectal polyp: Secondary | ICD-10-CM

## 2020-09-03 DIAGNOSIS — D128 Benign neoplasm of rectum: Secondary | ICD-10-CM

## 2020-09-03 MED ORDER — SODIUM CHLORIDE 0.9 % IV SOLN: 500.0000 mL | Freq: Once | INTRAVENOUS | Status: DC

## 2020-09-03 NOTE — Progress Notes (Signed)
Called to room to assist during endoscopic procedure.  Patient ID and intended procedure confirmed with present staff. Received instructions for my participation in the procedure from the performing physician.  

## 2020-09-03 NOTE — Progress Notes (Signed)
Pt Drowsy. VSS. To PACU, report to RN. No anesthetic complications noted.  

## 2020-09-03 NOTE — Op Note (Signed)
[name] Patient Name: Anthony Chapman Procedure Date: 09/03/2020 11:00 AM MRN: [id number] Endoscopist: Gerrit Heck , MD Age: 70 Referring MD:  Date of Birth: 1951-02-02 Gender: Male Account #: [id number] Procedure:                Colonoscopy Indications:              Screening for colorectal malignant neoplasm (last                            colonoscopy was more than 10 years ago)                           Last colonoscopy was 05/2003 and notable for                            diverticulosis. Otherwise no recent GI symptoms and                            no known family history of colon cancer. Medicines:                Monitored Anesthesia Care Procedure:                Pre-Anesthesia Assessment:                           - Prior to the procedure, a History and Physical                            was performed, and patient medications and                            allergies were reviewed. The patient's tolerance of                            previous anesthesia was also reviewed. The risks                            and benefits of the procedure and the sedation                            options and risks were discussed with the patient.                            All questions were answered, and informed consent                            was obtained. Prior Anticoagulants: The patient has                            taken no previous anticoagulant or antiplatelet                            agents. ASA Grade Assessment: II - A patient with  mild systemic disease. After reviewing the risks                            and benefits, the patient was deemed in                            satisfactory condition to undergo the procedure.                           After obtaining informed consent, the colonoscope                            was passed under direct vision. Throughout the                            procedure, the patient's blood  pressure, pulse, and                            oxygen saturations were monitored continuously. The                            Olympus CF-HQ190L (Serial# [id number]) Colonoscope was                            introduced through the anus and advanced to the the                            cecum, identified by appendiceal orifice and                            ileocecal valve. The colonoscopy was performed                            without difficulty. The patient tolerated the                            procedure well. The quality of the bowel                            preparation was good. The ileocecal valve,                            appendiceal orifice, and rectum were photographed. Scope In: 11:23:30 AM Scope Out: 11:43:21 AM Scope Withdrawal Time: 0 hours 17 minutes 24 seconds  Total Procedure Duration: 0 hours 19 minutes 51 seconds  Findings:                 The perianal and digital rectal examinations were                            normal.                           Three sessile polyps were found in the sigmoid  colon (2) and cecum (1). The polyps were 3 to 5 mm                            in size. These polyps were removed with a cold                            snare. Resection and retrieval were complete.                            Estimated blood loss was minimal.                           Two sessile polyps were found in the rectum. The                            polyps were 1 to 3 mm in size. Based on small size                            and positioning, these polyps were removed with a                            cold biopsy forceps. Resection and retrieval were                            complete. Estimated blood loss was minimal.                           Multiple small and large-mouthed diverticula were                            found in the sigmoid colon.                           Non-bleeding internal hemorrhoids were found during                             retroflexion. The hemorrhoids were small. Complications:            No immediate complications. Estimated Blood Loss:     Estimated blood loss was minimal. Impression:               - Three 3 to 5 mm polyps in the sigmoid colon and                            in the cecum, removed with a cold snare. Resected                            and retrieved.                           - Two 1 to 3 mm polyps in the rectum, removed with  a cold biopsy forceps. Resected and retrieved.                           - Diverticulosis in the sigmoid colon.                           - Non-bleeding internal hemorrhoids. Recommendation:           - Patient has a contact number available for                            emergencies. The signs and symptoms of potential                            delayed complications were discussed with the                            patient. Return to normal activities tomorrow.                            Written discharge instructions were provided to the                            patient.                           - Resume previous diet.                           - Continue present medications.                           - Await pathology results.                           - Repeat colonoscopy in 3 - 5 years for                            surveillance based on pathology results.                           - Return to GI clinic PRN.                           - Use fiber, for example Citrucel, Fibercon, Konsyl                            or Metamucil. Gerrit Heck, MD 09/03/2020 11:49:44 AM

## 2020-09-03 NOTE — Progress Notes (Signed)
Pt's states no medical or surgical changes since previsit or office visit. 

## 2020-09-03 NOTE — Patient Instructions (Signed)
Please read handouts provided. Continue present medications. Await pathology results. Return to GI clinic as needed. Consider a fiber supplement, for example Citrucel, Fibercon, Konsyl or Metamucil.     YOU HAD AN ENDOSCOPIC PROCEDURE TODAY AT [facility]:   Refer to the procedure report that was given to you for any specific questions about what was found during the examination.  If the procedure report does not answer your questions, please call your gastroenterologist to clarify.  If you requested that your care partner not be given the details of your procedure findings, then the procedure report has been included in a sealed envelope for you to review at your convenience later.  YOU SHOULD EXPECT: Some feelings of bloating in the abdomen. Passage of more gas than usual.  Walking can help get rid of the air that was put into your GI tract during the procedure and reduce the bloating. If you had a lower endoscopy (such as a colonoscopy or flexible sigmoidoscopy) you may notice spotting of blood in your stool or on the toilet paper. If you underwent a bowel prep for your procedure, you may not have a normal bowel movement for a few days.  Please Note:  You might notice some irritation and congestion in your nose or some drainage.  This is from the oxygen used during your procedure.  There is no need for concern and it should clear up in a day or so.  SYMPTOMS TO REPORT IMMEDIATELY:   Following lower endoscopy (colonoscopy or flexible sigmoidoscopy):  Excessive amounts of blood in the stool  Significant tenderness or worsening of abdominal pains  Swelling of the abdomen that is new, acute  Fever of 100F or higher   For urgent or emergent issues, a gastroenterologist can be reached at any hour by calling [phone]. Do not use MyChart messaging for urgent concerns.    DIET:  We do recommend a small meal at first, but then you may proceed to your regular diet.  Drink  plenty of fluids but you should avoid alcoholic beverages for 24 hours.  ACTIVITY:  You should plan to take it easy for the rest of today and you should NOT DRIVE or use heavy machinery until tomorrow (because of the sedation medicines used during the test).    FOLLOW UP: Our staff will call the number listed on your records 48-72 hours following your procedure to check on you and address any questions or concerns that you may have regarding the information given to you following your procedure. If we do not reach you, we will leave a message.  We will attempt to reach you two times.  During this call, we will ask if you have developed any symptoms of COVID 19. If you develop any symptoms (ie: fever, flu-like symptoms, shortness of breath, cough etc.) before then, please call [phone].  If you test positive for Covid 19 in the 2 weeks post procedure, please call and report this information to [place].    If any biopsies were taken you will be contacted by phone or by letter within the next 1-3 weeks.  Please call us at [phone] if you have not heard about the biopsies in 3 weeks.    SIGNATURES/CONFIDENTIALITY: You and/or your care partner have signed paperwork which will be entered into your electronic medical record.  These signatures attest to the fact that that the information above on your After Visit Summary has been reviewed and is understood.  Full responsibility  of the confidentiality of this discharge information lies with you and/or your care-partner.

## 2020-09-05 ENCOUNTER — Telehealth: Payer: Self-pay

## 2020-09-05 NOTE — Telephone Encounter (Signed)
  Follow up Call-  Call back number 09/03/2020  Post procedure Call Back phone  # [phone]  Permission to leave phone message Yes  Some recent data might be hidden     2nd follow up call made.  NALM

## 2020-09-05 NOTE — Telephone Encounter (Signed)
Left message on answering machine. 

## 2020-09-16 ENCOUNTER — Encounter: Payer: Self-pay | Admitting: Gastroenterology

## 2020-09-29 ENCOUNTER — Other Ambulatory Visit: Payer: Self-pay | Admitting: Internal Medicine

## 2020-09-29 ENCOUNTER — Telehealth: Payer: Self-pay | Admitting: Internal Medicine

## 2020-09-29 ENCOUNTER — Encounter (HOSPITAL_BASED_OUTPATIENT_CLINIC_OR_DEPARTMENT_OTHER): Payer: Self-pay | Admitting: Emergency Medicine

## 2020-09-29 ENCOUNTER — Other Ambulatory Visit: Payer: Self-pay

## 2020-09-29 ENCOUNTER — Emergency Department (HOSPITAL_BASED_OUTPATIENT_CLINIC_OR_DEPARTMENT_OTHER): Payer: No Typology Code available for payment source

## 2020-09-29 ENCOUNTER — Inpatient Hospital Stay (HOSPITAL_BASED_OUTPATIENT_CLINIC_OR_DEPARTMENT_OTHER)
Admission: EM | Admit: 2020-09-29 | Discharge: 2020-10-02 | DRG: 244 | Disposition: A | Discharge status: Home / Self Care | Payer: No Typology Code available for payment source | Attending: Internal Medicine | Admitting: Internal Medicine

## 2020-09-29 DIAGNOSIS — Z955 Presence of coronary angioplasty implant and graft: Secondary | ICD-10-CM | POA: Diagnosis not present

## 2020-09-29 DIAGNOSIS — I442 Atrioventricular block, complete: Secondary | ICD-10-CM | POA: Diagnosis present

## 2020-09-29 DIAGNOSIS — Z8261 Family history of arthritis: Secondary | ICD-10-CM

## 2020-09-29 DIAGNOSIS — G4733 Obstructive sleep apnea (adult) (pediatric): Secondary | ICD-10-CM | POA: Diagnosis present

## 2020-09-29 DIAGNOSIS — E669 Obesity, unspecified: Secondary | ICD-10-CM | POA: Diagnosis present

## 2020-09-29 DIAGNOSIS — R001 Bradycardia, unspecified: Secondary | ICD-10-CM | POA: Diagnosis present

## 2020-09-29 DIAGNOSIS — I252 Old myocardial infarction: Secondary | ICD-10-CM

## 2020-09-29 DIAGNOSIS — Z20822 Contact with and (suspected) exposure to covid-19: Secondary | ICD-10-CM | POA: Diagnosis present

## 2020-09-29 DIAGNOSIS — E785 Hyperlipidemia, unspecified: Secondary | ICD-10-CM | POA: Diagnosis present

## 2020-09-29 DIAGNOSIS — I10 Essential (primary) hypertension: Secondary | ICD-10-CM | POA: Diagnosis present

## 2020-09-29 DIAGNOSIS — I447 Left bundle-branch block, unspecified: Secondary | ICD-10-CM | POA: Diagnosis present

## 2020-09-29 DIAGNOSIS — Z79899 Other long term (current) drug therapy: Secondary | ICD-10-CM | POA: Diagnosis not present

## 2020-09-29 DIAGNOSIS — Z8249 Family history of ischemic heart disease and other diseases of the circulatory system: Secondary | ICD-10-CM | POA: Diagnosis not present

## 2020-09-29 DIAGNOSIS — I700 Atherosclerosis of aorta: Secondary | ICD-10-CM | POA: Diagnosis present

## 2020-09-29 DIAGNOSIS — Z6836 Body mass index (BMI) 36.0-36.9, adult: Secondary | ICD-10-CM

## 2020-09-29 DIAGNOSIS — R55 Syncope and collapse: Secondary | ICD-10-CM

## 2020-09-29 DIAGNOSIS — Z9861 Coronary angioplasty status: Secondary | ICD-10-CM

## 2020-09-29 DIAGNOSIS — F17210 Nicotine dependence, cigarettes, uncomplicated: Secondary | ICD-10-CM | POA: Diagnosis present

## 2020-09-29 DIAGNOSIS — Z7982 Long term (current) use of aspirin: Secondary | ICD-10-CM

## 2020-09-29 DIAGNOSIS — Z959 Presence of cardiac and vascular implant and graft, unspecified: Secondary | ICD-10-CM

## 2020-09-29 DIAGNOSIS — I2510 Atherosclerotic heart disease of native coronary artery without angina pectoris: Secondary | ICD-10-CM

## 2020-09-29 DIAGNOSIS — K219 Gastro-esophageal reflux disease without esophagitis: Secondary | ICD-10-CM | POA: Diagnosis present

## 2020-09-29 LAB — CBC WITH DIFFERENTIAL/PLATELET
Abs Immature Granulocytes: 0.01 10*3/uL (ref 0.00–0.07)
Basophils Absolute: 0 10*3/uL (ref 0.0–0.1)
Basophils Relative: 1 %
Eosinophils Absolute: 0.2 10*3/uL (ref 0.0–0.5)
Eosinophils Relative: 4 %
HCT: 42.4 % (ref 39.0–52.0)
Hemoglobin: 13.4 g/dL (ref 13.0–17.0)
Immature Granulocytes: 0 %
Lymphocytes Relative: 27 %
Lymphs Abs: 1.4 10*3/uL (ref 0.7–4.0)
MCH: 25.7 pg — ABNORMAL LOW (ref 26.0–34.0)
MCHC: 31.6 g/dL (ref 30.0–36.0)
MCV: 81.4 fL (ref 80.0–100.0)
Monocytes Absolute: 0.6 10*3/uL (ref 0.1–1.0)
Monocytes Relative: 12 %
Neutro Abs: 3 10*3/uL (ref 1.7–7.7)
Neutrophils Relative %: 56 %
Platelets: 149 10*3/uL — ABNORMAL LOW (ref 150–400)
RBC: 5.21 MIL/uL (ref 4.22–5.81)
RDW: 15.4 % (ref 11.5–15.5)
WBC: 5.3 10*3/uL (ref 4.0–10.5)
nRBC: 0 % (ref 0.0–0.2)

## 2020-09-29 LAB — TROPONIN I (HIGH SENSITIVITY)
Troponin I (High Sensitivity): 40 ng/L — ABNORMAL HIGH (ref <18)
Troponin I (High Sensitivity): 33 ng/L — ABNORMAL HIGH (ref <18)

## 2020-09-29 LAB — BASIC METABOLIC PANEL
Anion gap: 9 (ref 5–15)
BUN: 19 mg/dL (ref 8–23)
CO2: 23 mmol/L (ref 22–32)
Calcium: 8.3 mg/dL — ABNORMAL LOW (ref 8.9–10.3)
Chloride: 104 mmol/L (ref 98–111)
Creatinine, Ser: 0.9 mg/dL (ref 0.61–1.24)
GFR, Estimated: >60 mL/min (ref >60)
Glucose, Bld: 131 mg/dL — ABNORMAL HIGH (ref 70–99)
Potassium: 3.3 mmol/L — ABNORMAL LOW (ref 3.5–5.1)
Sodium: 136 mmol/L (ref 135–145)

## 2020-09-29 LAB — RESP PANEL BY RT-PCR (FLU A&B, COVID) ARPGX2
Influenza A by PCR: NEGATIVE
Influenza B by PCR: NEGATIVE
SARS Coronavirus 2 by RT PCR: NEGATIVE

## 2020-09-29 LAB — CBG MONITORING, ED: Glucose-Capillary: 131 mg/dL — ABNORMAL HIGH (ref 70–99)

## 2020-09-29 LAB — MAGNESIUM: Magnesium: 1.9 mg/dL (ref 1.7–2.4)

## 2020-09-29 LAB — GLUCOSE, CAPILLARY: Glucose-Capillary: 123 mg/dL — ABNORMAL HIGH (ref 70–99)

## 2020-09-29 LAB — MRSA PCR SCREENING: MRSA by PCR: NEGATIVE

## 2020-09-29 MED ORDER — POTASSIUM CHLORIDE ER 8 MEQ PO TBCR
8.0000 meq | EXTENDED_RELEASE_TABLET | Freq: Two times a day (BID) | ORAL | Status: DC
  Filled 2020-09-29: qty 1

## 2020-09-29 MED ORDER — ACETAMINOPHEN 650 MG RE SUPP: 650.0000 mg | Freq: Four times a day (QID) | RECTAL | Status: DC | PRN

## 2020-09-29 MED ORDER — POTASSIUM CHLORIDE CRYS ER 20 MEQ PO TBCR
40.0000 meq | EXTENDED_RELEASE_TABLET | Freq: Once | ORAL | Status: AC
  Administered 2020-09-29: 40 meq via ORAL
  Filled 2020-09-29: qty 2

## 2020-09-29 MED ORDER — ENOXAPARIN SODIUM 40 MG/0.4ML ~~LOC~~ SOLN
40.0000 mg | SUBCUTANEOUS | Status: DC
  Administered 2020-09-29: 40 mg via SUBCUTANEOUS
  Filled 2020-09-29: qty 0.4

## 2020-09-29 MED ORDER — ISOSORBIDE MONONITRATE ER 60 MG PO TB24
60.0000 mg | ORAL_TABLET | Freq: Every day | ORAL | Status: DC
  Administered 2020-09-30 – 2020-10-02 (×3): 60 mg via ORAL
  Filled 2020-09-29 (×3): qty 1

## 2020-09-29 MED ORDER — AMLODIPINE BESYLATE 10 MG PO TABS
10.0000 mg | ORAL_TABLET | Freq: Every day | ORAL | Status: DC
  Administered 2020-09-30 – 2020-10-02 (×3): 10 mg via ORAL
  Filled 2020-09-29 (×3): qty 1

## 2020-09-29 MED ORDER — MAGNESIUM SULFATE 2 GM/50ML IV SOLN
2.0000 g | Freq: Once | INTRAVENOUS | Status: AC
  Administered 2020-09-29: 2 g via INTRAVENOUS
  Filled 2020-09-29: qty 50

## 2020-09-29 MED ORDER — ONDANSETRON HCL 4 MG PO TABS: 4.0000 mg | ORAL_TABLET | Freq: Four times a day (QID) | ORAL | Status: DC | PRN

## 2020-09-29 MED ORDER — ACETAMINOPHEN 325 MG PO TABS
650.0000 mg | ORAL_TABLET | Freq: Four times a day (QID) | ORAL | Status: DC | PRN
Start: 2020-09-29 — End: 2020-10-01

## 2020-09-29 MED ORDER — ROSUVASTATIN CALCIUM 20 MG PO TABS
40.0000 mg | ORAL_TABLET | Freq: Every day | ORAL | Status: DC
  Administered 2020-09-30 – 2020-10-02 (×3): 40 mg via ORAL
  Filled 2020-09-29 (×3): qty 2

## 2020-09-29 MED ORDER — CALCIUM GLUCONATE-NACL 1-0.675 GM/50ML-% IV SOLN
1.0000 g | Freq: Once | INTRAVENOUS | Status: AC
  Administered 2020-09-30: 1000 mg via INTRAVENOUS
  Filled 2020-09-29: qty 50

## 2020-09-29 MED ORDER — ATROPINE SULFATE 1 MG/10ML IJ SOSY
0.5000 mg | PREFILLED_SYRINGE | INTRAMUSCULAR | Status: AC | PRN
  Administered 2020-09-30 (×2): 0.5 mg via INTRAVENOUS
  Filled 2020-09-29 (×2): qty 10

## 2020-09-29 MED ORDER — NITROGLYCERIN 0.4 MG SL SUBL: 0.4000 mg | SUBLINGUAL_TABLET | SUBLINGUAL | Status: DC | PRN

## 2020-09-29 MED ORDER — ONDANSETRON HCL 4 MG/2ML IJ SOLN: 4.0000 mg | Freq: Four times a day (QID) | INTRAMUSCULAR | Status: DC | PRN

## 2020-09-29 MED ORDER — MONTELUKAST SODIUM 10 MG PO TABS
10.0000 mg | ORAL_TABLET | Freq: Every day | ORAL | Status: DC | PRN
Start: 2020-09-29 — End: 2020-10-02

## 2020-09-29 MED ORDER — LACTATED RINGERS IV BOLUS
500.0000 mL | Freq: Once | INTRAVENOUS | Status: AC
  Administered 2020-09-30: 500 mL via INTRAVENOUS

## 2020-09-29 MED ORDER — LORATADINE 10 MG PO TABS
10.0000 mg | ORAL_TABLET | Freq: Every day | ORAL | Status: DC
  Administered 2020-09-30 – 2020-10-02 (×3): 10 mg via ORAL
  Filled 2020-09-29 (×3): qty 1

## 2020-09-29 MED ORDER — ASPIRIN 81 MG PO CHEW
81.0000 mg | CHEWABLE_TABLET | Freq: Every day | ORAL | Status: DC
  Administered 2020-09-30 – 2020-10-02 (×3): 81 mg via ORAL
  Filled 2020-09-29 (×4): qty 1

## 2020-09-29 MED ORDER — GLUCAGON HCL RDNA (DIAGNOSTIC) 1 MG IJ SOLR
1.0000 mg | Freq: Once | INTRAMUSCULAR | Status: AC
  Administered 2020-09-30: 1 mg via INTRAVENOUS
  Filled 2020-09-29: qty 1

## 2020-09-29 NOTE — ED Notes (Signed)
Re-drew labs that were ordered earlier as they tubes were not found in lab

## 2020-09-29 NOTE — ED Triage Notes (Signed)
Dizzy since Friday that comes and goes states passed out on Friday but not since  States vision gets fuzzy

## 2020-09-29 NOTE — Telephone Encounter (Signed)
Advised to go to ED. Patient understood and decided to go.
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
## 2020-09-29 NOTE — H&P (Signed)
History and Physical    Anthony Chapman KYH:[id number] DOB: [birth year] DOA: 09/29/2020  PCP: Cassandria Anger, MD   Patient coming from: Home.   I have personally briefly reviewed patient's old medical records in [place]  Chief Complaint: Dizziness.  HPI: Anthony Chapman is a 70 y.o. male with medical history significant of seasonal allergies, CAD, history of MI, LBBB, osteoarthritis of multiple sites, ED, hypertension, GERD, hyperlipidemia, sleep apnea on CPAP who is coming from [facility] after presenting there with several days of dizziness.  He denies chest pain, palpitations, diaphoresis, PND, orthopnea or pitting edema of the lower extremities.  He takes carvedilol 25 mg p.o. twice daily.  He denies fever, chills, sore throat, rhinorrhea, dyspnea, wheezing or hemoptysis.  No abdominal pain, diarrhea, constipation, melena or hematochezia.  Denies dysuria, frequency hematuria.  Denies polyuria, polydipsia, polyphagia or blurred vision.  ED Course: Initial vital signs temperature 98.3 degrees, heart, pulse thirty-eight, respirations fifteen, BP 149/72 mmHg O2 sat 97% on room air.  No medications were given in the emergency department.  The ER provider contacted cardiology who recommended to observe the patient closely until the beta-blocker is eliminated.  Labwork: CBC showed a white count of 5.3, hemoglobin 13.4 g/dL and platelets 149.  Magnesium was 1.9 mg/dL.  Troponin was forty and then 33 ng/L.  BMP showed a potassium of 3.3 mmol/L.  Glucose 131 and calcium 8.3 mg/dL.  No albumin available for correction so we will recheck in the morning.  Imaging: One-view portable chest radiograph did not find any acute abnormalities.  Please see images in full radiology report for further detail.  Review of Systems: As per HPI otherwise all other systems reviewed and are negative.  Past Medical History:  Diagnosis Date   Allergy    Spring- mild    Anginal pain ([place])     Arthritis    "all over" (08/27/2016)   CAD (coronary artery disease)    a.  stent placement to the RCA in 2010 b. cath in 2015 with PCI to the RCA (2.58mm x45mm Resolute DES) and LCx (3.73mm x59mm Resolute DES).  c. 08/2016: cath showing 100% ISR of d-RCA with collaterals, 10% Ost LAD ISR, 90% mid-Cx stenosis (3.0x22mm Resolute DES placed)   ED (erectile dysfunction)    GERD (gastroesophageal reflux disease)    HTN (hypertension)    Hyperlipemia    LBBB (left bundle branch block)    Myocardial infarction ([place]) 2010; 2015   Sleep apnea    wears cpap     Past Surgical History:  Procedure Laterality Date   CARDIAC CATHETERIZATION  09/2013   CARDIAC CATHETERIZATION N/A 08/27/2016   Procedure: Left Heart Cath and Coronary Angiography;  Surgeon: Leonie Man, MD;  Location: [facility];  Service: Cardiovascular;  Laterality: N/A;   CARDIAC CATHETERIZATION N/A 08/27/2016   Procedure: Coronary Stent Intervention;  Surgeon: Leonie Man, MD;  Location: [facility];  Service: Cardiovascular;  Laterality: N/A;   COLONOSCOPY  2004   CORONARY ANGIOPLASTY WITH STENT PLACEMENT  2010; 1/192018   HERNIA REPAIR     INGUINAL HERNIA REPAIR Left    LEFT HEART CATH AND CORONARY ANGIOGRAPHY N/A 05/20/2020   Procedure: LEFT HEART CATH AND CORONARY ANGIOGRAPHY;  Surgeon: Martinique, Peter M, MD;  Location: [facility];  Service: Cardiovascular;  Laterality: N/A;   UMBILICAL HERNIA REPAIR      Social History  reports that he has been smoking cigarettes  and cigars. He has a 30.00 pack-year smoking history. He has never used smokeless tobacco. He reports current alcohol use. He reports that he does not use drugs.  Allergies  Allergen Reactions   Losartan Other (See Comments)    angioedema    Family History  Problem Relation Age of Onset   Hypertension Mother    CAD Mother    Hypertension Father    Arthritis Father    Hypertension Other    CAD Maternal Aunt     Colon cancer Neg Hx    Colon polyps Neg Hx    Esophageal cancer Neg Hx    Rectal cancer Neg Hx    Stomach cancer Neg Hx    Prior to Admission medications   Medication Sig Start Date End Date Taking? Authorizing Provider  amLODipine (NORVASC) 10 MG tablet TAKE 1 TABLET(10 MG) BY MOUTH DAILY Patient taking differently: Take 10 mg by mouth daily. 03/07/20  Yes Martinique, Peter M, MD  aspirin 81 MG EC tablet Take 1 tablet (81 mg total) by mouth daily. 10/06/16  Yes Almyra Deforest, PA  carvedilol (COREG) 25 MG tablet TAKE 1 TABLET(25 MG) BY MOUTH TWICE DAILY WITH A MEAL Patient taking differently: Take 25 mg by mouth 2 (two) times daily with a meal. 03/07/20  Yes Martinique, Peter M, MD  cetirizine (ZYRTEC) 10 MG tablet Take 10 mg by mouth daily as needed for allergies.   Yes [provider]  isosorbide mononitrate (IMDUR) 60 MG 24 hr tablet Take 1 tablet (60 mg total) by mouth daily. 09/10/19  Yes Martinique, Peter M, MD  montelukast (SINGULAIR) 10 MG tablet Take 10 mg by mouth daily as needed (allergies).   Yes [provider]  nitroGLYCERIN (NITROSTAT) 0.4 MG SL tablet DISSOLVE 1 TABLET UNDER THE TONGUE EVERY 5 MINUTES AS  NEEDED FOR CHEST PAIN. MAX  OF 3 TABLETS IN 15 MINUTES. CALL 911 IF PAIN PERSISTS. Patient taking differently: Place under the tongue See admin instructions. Dissolve one tablet under the tongues every 5 minutes as needed for chest pain. Max of 3 tablets in 15 minutes. Call 911 if pain persists. 06/30/20  Yes Martinique, Peter M, MD  potassium chloride (KLOR-CON) 8 MEQ tablet Take 8 mEq by mouth at bedtime.   Yes [provider]  rosuvastatin (CRESTOR) 40 MG tablet Take 40 mg by mouth daily.   Yes [provider]  VITAMIN D, CHOLECALCIFEROL, PO Take 1 tablet by mouth daily as needed (vitamin d supplement).   Yes [provider]    Physical Exam: Vitals:   09/29/20 1630 09/29/20 1930 09/29/20 2000 09/29/20 2055  BP: (!) 150/86 (!) 144/77 (!)  150/73   Pulse: (!) 38 (!) 40 (!) 41 (!) 40  Resp: 11 (!) 26 14 18   Temp:  98.3 F (36.8 C) 98.3 F (36.8 C) (!) 97.3 F (36.3 C)  TempSrc:  Oral  Oral  SpO2: 94% 96% 95% 97%  Weight:    111.2 kg  Height:    5\' 9"  (1.753 m)    Constitutional: NAD, calm, comfortable Eyes: PERRL, lids and conjunctivae normal ENMT: Mucous membranes are moist. Posterior pharynx clear of any exudate or lesions.Normal dentition.  Neck: normal, supple, no masses, no thyromegaly Respiratory: clear to auscultation bilaterally, no wheezing, no crackles. Normal respiratory effort. No accessory muscle use.  Cardiovascular: Sinus bradycardic in the 30s and low 40s, no murmurs / rubs / gallops. No extremity edema. 2+ pedal pulses. No carotid bruits.  Abdomen: Obese, no  distention.  Bowel sounds positive.  Soft, no tenderness, no masses palpated. No hepatosplenomegaly. Bowel sounds positive.  Musculoskeletal: no clubbing / cyanosis.  Good ROM, no contractures. Normal muscle tone.  Skin: no rashes, lesions, ulcers on very limited dermatological examination. Neurologic: CN 2-12 grossly intact. Sensation intact, DTR normal. Strength 5/5 in all 4.  Psychiatric: Normal judgment and insight. Alert and oriented x 3. Normal mood.   Labs on Admission: I have personally reviewed following labs and imaging studies  CBC: Recent Labs  Lab 09/29/20 1200  WBC 5.3  NEUTROABS 3.0  HGB 13.4  HCT 42.4  MCV 81.4  PLT 149*    Basic Metabolic Panel: Recent Labs  Lab 09/29/20 1200  NA 136  K 3.3*  CL 104  CO2 23  GLUCOSE 131*  BUN 19  CREATININE 0.90  CALCIUM 8.3*  MG 1.9    GFR: Estimated Creatinine Clearance: 95.2 mL/min (by C-G formula based on SCr of 0.9 mg/dL).  Liver Function Tests: No results for input(s): AST, ALT, ALKPHOS, BILITOT, PROT, ALBUMIN in the last 168 hours.  Urine analysis:    Component Value Date/Time   COLORURINE YELLOW 07/09/2016 1013   APPEARANCEUR CLEAR 07/09/2016 1013   LABSPEC  1.020 07/09/2016 1013   PHURINE 6.0 07/09/2016 1013   GLUCOSEU NEGATIVE 07/09/2016 1013   HGBUR NEGATIVE 07/09/2016 1013   BILIRUBINUR NEGATIVE 07/09/2016 1013   KETONESUR NEGATIVE 07/09/2016 1013   UROBILINOGEN 2.0 (A) 07/09/2016 1013   NITRITE NEGATIVE 07/09/2016 1013   LEUKOCYTESUR NEGATIVE 07/09/2016 1013    Radiological Exams on Admission: DG Chest Portable 1 View  Result Date: 09/29/2020 CLINICAL DATA:  Near syncope, dizziness EXAM: PORTABLE CHEST 1 VIEW COMPARISON:  None. FINDINGS: Normal heart size. Normal mediastinal contour. No pneumothorax. No pleural effusion. Lungs appear clear, with no acute consolidative airspace disease and no pulmonary edema. IMPRESSION: No active disease. Electronically Signed   By: Ilona Sorrel M.D.   On: 09/29/2020 11:21    EKG: Independently reviewed.  Vent. rate 39 BPM PR interval * ms QRS duration 145 ms QT/QTc 525/423 ms P-R-T axes 67 60 52 Sinus bradycardia Prolonged PR interval Right bundle branch block  Principal Problem:   Symptomatic bradycardia Admitted to progressive unit/inpatient. Continue holding carvedilol. Glucagon 1 mg IVP x1 (given). LR 1000 mL bolus (given). Calcium gluconate 1 g IVPB x1 given. Atropine 0.5 mg IVP as needed. Keep pacemaker pads on the patient. Monitor electrolytes.  Active Problems:   Essential hypertension Continue amlodipine 10 mg p.o. daily. Beta-blocker being held. Monitor blood pressure and heart rate.    CAD S/P percutaneous coronary angioplasty - 2015 PCI to RCA (2.5 x 14 Resolute DES) & Cx (3 x 12 Resolute DES) Holding beta-blocker. Continue statin. Continue aspirin 81 mg p.o. daily. Continue Imdur 60 mg p.o. daily. Nitroglycerin as needed.    Hyperlipidemia LDL goal <70 Continue rosuvastatin 40 mg p.o. daily.    Aortic atherosclerosis (HCC) Continue statin.   DVT prophylaxis: Lovenox SQ. Code Status:   Full code. Family Communication: Disposition Plan:   Patient is  from:  Home.  Anticipated DC to:  Home.  Anticipated DC date:  09/30/2020.  Anticipated DC barriers: Clinical status.  Consults called: Admission status:  Inpatient/progressive unit.   Severity of Illness: High due to beta-blocker induced symptomatic bradycardia.  The patient will need close cardiac monitoring and has received atropine, glucagon and IV fluids.  He will need to remain hospitalized until his heart rate is at a higher and safer number.  Reubin Milan MD [facility]  How to contact the [facility] Attending or Consulting provider [place] or covering provider during after hours [place], for this patient?   1. Check the care team in [facility] and look for a) attending/consulting [facility] provider listed and b) the [facility] team listed 2. Log into [email or web address] and use [place]'s universal password to access. If you do not have the password, please contact the hospital operator. 3. Locate the [facility] provider you are looking for under [facility] and page to a number that you can be directly reached. 4. If you still have difficulty reaching the provider, please page the [facility] (Director on Call) for the Hospitalists listed on amion for assistance.  09/29/2020, 10:16 PM   This document was prepared using Dragon voice recognition software and may contain some unintended transcription errors.

## 2020-09-29 NOTE — ED Notes (Addendum)
Trop for 14:33

## 2020-09-29 NOTE — ED Notes (Signed)
Paged Dr. Acie Fredrickson

## 2020-09-29 NOTE — Telephone Encounter (Signed)
Patients wife Anthony Chapman calling states the patient is experiencing some dizziness, nausea, and feels like he is going to pass out. States he is also having some issues with his blood sugar. Transferred to team health for further evaluation.

## 2020-09-29 NOTE — Progress Notes (Signed)
   09/29/20 2324  Assess: MEWS Score  Temp 98.2 F (36.8 C)  BP 123/69  Pulse Rate (!) 39  ECG Heart Rate (!) 39  Resp 17  Level of Consciousness Alert  SpO2 92 %  O2 Device Room Air  Patient Activity (if Appropriate) In bed  Assess: MEWS Score  MEWS Temp 0  MEWS Systolic 0  MEWS Pulse 2  MEWS RR 0  MEWS LOC 0  MEWS Score 2  MEWS Score Color Yellow  Assess: if the MEWS score is Yellow or Red  Were vital signs taken at a resting state? Yes  Focused Assessment No change from prior assessment  Early Detection of Sepsis Score *See Row Information* Low  MEWS guidelines implemented *See Row Information* Yes  Treat  MEWS Interventions Escalated (See documentation below)  Pain Scale 0-10  Pain Score 0  Take Vital Signs  Increase Vital Sign Frequency  Yellow: Q 2hr X 2 then Q 4hr X 2, if remains yellow, continue Q 4hrs  Escalate  MEWS: Escalate Yellow: discuss with charge nurse/RN and consider discussing with provider and RRT  Notify: Charge Nurse/RN  Name of Charge Nurse/RN Notified Janett Billow RN  Date Charge Nurse/RN Notified 09/29/20  Time Charge Nurse/RN Notified 2330  Notify: Provider  Provider Name/Title Dr. Olevia Bowens  Date Provider Notified 09/29/20  Time Provider Notified 2340  Notification Type Page  Notification Reason Other (Comment) (Bradycardia HR 20s)  Provider response See new orders  Date of Provider Response 09/29/20  Time of Provider Response 2341  Document  Progress note created (see row info) Yes   Pt with HR as low as 24. Dr. Olevia Bowens paged. Patient is asymptomatic. Zoll pads placed. PRN atropine and calcium gluconate ordered. Will continue to monitor the patient.

## 2020-09-29 NOTE — Plan of Care (Signed)
  Problem: Education: Goal: Knowledge of General Education information will improve Description: Including pain rating scale, medication(s)/side effects and non-pharmacologic comfort measures Outcome: Progressing   Problem: Clinical Measurements: Goal: Ability to maintain clinical measurements within normal limits will improve Outcome: Progressing   Problem: Clinical Measurements: Goal: Cardiovascular complication will be avoided Outcome: Progressing   Problem: Activity: Goal: Risk for activity intolerance will decrease Outcome: Progressing   Problem: Nutrition: Goal: Adequate nutrition will be maintained Outcome: Progressing   Problem: Pain Managment: Goal: General experience of comfort will improve Outcome: Progressing

## 2020-09-30 ENCOUNTER — Inpatient Hospital Stay (HOSPITAL_COMMUNITY): Payer: No Typology Code available for payment source

## 2020-09-30 ENCOUNTER — Ambulatory Visit: Payer: Medicare Other | Admitting: Internal Medicine

## 2020-09-30 DIAGNOSIS — I442 Atrioventricular block, complete: Secondary | ICD-10-CM

## 2020-09-30 LAB — COMPREHENSIVE METABOLIC PANEL
ALT: 39 U/L (ref 0–44)
AST: 26 U/L (ref 15–41)
Albumin: 2.8 g/dL — ABNORMAL LOW (ref 3.5–5.0)
Alkaline Phosphatase: 95 U/L (ref 38–126)
Anion gap: 8 (ref 5–15)
BUN: 16 mg/dL (ref 8–23)
CO2: 23 mmol/L (ref 22–32)
Calcium: 8.3 mg/dL — ABNORMAL LOW (ref 8.9–10.3)
Chloride: 108 mmol/L (ref 98–111)
Creatinine, Ser: 0.86 mg/dL (ref 0.61–1.24)
GFR, Estimated: >60 mL/min (ref >60)
Glucose, Bld: 140 mg/dL — ABNORMAL HIGH (ref 70–99)
Potassium: 3.5 mmol/L (ref 3.5–5.1)
Sodium: 139 mmol/L (ref 135–145)
Total Bilirubin: 0.7 mg/dL (ref 0.3–1.2)
Total Protein: 6.9 g/dL (ref 6.5–8.1)

## 2020-09-30 LAB — ECHOCARDIOGRAM COMPLETE
Area-P 1/2: 4.15 cm2
Calc EF: 63 %
Height: 69 in
S' Lateral: 3.3 cm
Single Plane A2C EF: 64.1 %
Single Plane A4C EF: 60.3 %
Weight: 3922.42 oz

## 2020-09-30 LAB — CBC
HCT: 40.2 % (ref 39.0–52.0)
Hemoglobin: 13 g/dL (ref 13.0–17.0)
MCH: 26 pg (ref 26.0–34.0)
MCHC: 32.3 g/dL (ref 30.0–36.0)
MCV: 80.4 fL (ref 80.0–100.0)
Platelets: 150 10*3/uL (ref 150–400)
RBC: 5 MIL/uL (ref 4.22–5.81)
RDW: 15.5 % (ref 11.5–15.5)
WBC: 4.6 10*3/uL (ref 4.0–10.5)
nRBC: 0 % (ref 0.0–0.2)

## 2020-09-30 LAB — GLUCOSE, CAPILLARY
Glucose-Capillary: 93 mg/dL (ref 70–99)
Glucose-Capillary: 102 mg/dL — ABNORMAL HIGH (ref 70–99)
Glucose-Capillary: 95 mg/dL (ref 70–99)
Glucose-Capillary: 95 mg/dL (ref 70–99)

## 2020-09-30 LAB — PHOSPHORUS: Phosphorus: 2.5 mg/dL (ref 2.5–4.6)

## 2020-09-30 LAB — HEMOGLOBIN A1C
Hgb A1c MFr Bld: 6.6 % — ABNORMAL HIGH (ref 4.8–5.6)
Mean Plasma Glucose: 142.72 mg/dL

## 2020-09-30 LAB — TSH: TSH: 2.619 u[IU]/mL (ref 0.350–4.500)

## 2020-09-30 LAB — SURGICAL PCR SCREEN
MRSA, PCR: NEGATIVE
Staphylococcus aureus: NEGATIVE

## 2020-09-30 LAB — HIV ANTIBODY (ROUTINE TESTING W REFLEX): HIV Screen 4th Generation wRfx: NONREACTIVE

## 2020-09-30 MED ORDER — YOU HAVE A PACEMAKER BOOK
Freq: Once | Status: AC
  Filled 2020-09-30: qty 1

## 2020-09-30 MED ORDER — CHLORHEXIDINE GLUCONATE 4 % EX LIQD
60.0000 mL | Freq: Once | CUTANEOUS | Status: AC
  Administered 2020-09-30 – 2020-10-01 (×2): 4 via TOPICAL
  Filled 2020-09-30: qty 60

## 2020-09-30 MED ORDER — LACTATED RINGERS IV SOLN: INTRAVENOUS | Status: DC

## 2020-09-30 MED ORDER — POTASSIUM CHLORIDE CRYS ER 10 MEQ PO TBCR: 10.0000 meq | EXTENDED_RELEASE_TABLET | Freq: Two times a day (BID) | ORAL | Status: DC

## 2020-09-30 MED ORDER — CEFAZOLIN SODIUM-DEXTROSE 2-4 GM/100ML-% IV SOLN
2.0000 g | INTRAVENOUS | Status: AC
  Administered 2020-10-01: 2 g via INTRAVENOUS
  Filled 2020-09-30: qty 100

## 2020-09-30 MED ORDER — HYDRALAZINE HCL 50 MG PO TABS
50.0000 mg | ORAL_TABLET | Freq: Three times a day (TID) | ORAL | Status: DC
  Administered 2020-09-30 – 2020-10-02 (×6): 50 mg via ORAL
  Filled 2020-09-30 (×7): qty 1

## 2020-09-30 MED ORDER — POTASSIUM CHLORIDE CRYS ER 20 MEQ PO TBCR
20.0000 meq | EXTENDED_RELEASE_TABLET | Freq: Once | ORAL | Status: AC
  Administered 2020-09-30: 20 meq via ORAL
  Filled 2020-09-30: qty 1

## 2020-09-30 MED ORDER — CHLORHEXIDINE GLUCONATE 4 % EX LIQD
60.0000 mL | Freq: Once | CUTANEOUS | Status: AC
Start: 2020-09-30 — End: 2020-10-01
  Filled 2020-09-30: qty 60

## 2020-09-30 MED ORDER — ATROPINE SULFATE 1 MG/10ML IJ SOSY
0.5000 mg | PREFILLED_SYRINGE | INTRAMUSCULAR | Status: AC | PRN
  Administered 2020-09-30 (×2): 0.5 mg via INTRAVENOUS
  Filled 2020-09-30 (×2): qty 10

## 2020-09-30 MED ORDER — SODIUM CHLORIDE 0.9 % IV SOLN
80.0000 mg | INTRAVENOUS | Status: AC
  Administered 2020-10-01: 80 mg
  Filled 2020-09-30: qty 2

## 2020-09-30 MED ORDER — SODIUM CHLORIDE 0.9 % IV SOLN: INTRAVENOUS | Status: DC

## 2020-09-30 NOTE — Progress Notes (Signed)
  Echocardiogram 2D Echocardiogram has been performed.  Anthony Chapman 09/30/2020, 1:27 PM

## 2020-09-30 NOTE — Progress Notes (Addendum)
Attempted to walk patient per order, patient HR 40 and BP 162/65 while sitting. No dizziness or lightheadness. Patient asked to go to bathroom before we walked. After walking to the bathroom patient's HR dropped to 24 with BP of 125/62, patient also complained of being lightheaded so RN put patient back in bed. HR varying from 31 to 44, lightheadedness has gone away. RN will continue to monitor.    Update 0954: Pt HR sustaining from 37 to 80, RN will continue to monitor

## 2020-09-30 NOTE — Progress Notes (Addendum)
PROGRESS NOTE                                                                                                                                                                                                             Patient Demographics:    Anthony Chapman, is a 70 y.o. male, DOB - [birth year], JSH:[id number]  Outpatient Primary MD for the patient is Plotnikov, Evie Lacks, MD    LOS - 1  Admit date - 09/29/2020    Chief Complaint  Patient presents with  . Dizziness       Brief Narrative (HPI from H&P) - Anthony Chapman is a 70 y.o. male with medical history significant of seasonal allergies, CAD, history of MI, LBBB, osteoarthritis of multiple sites, ED, hypertension, GERD, hyperlipidemia, sleep apnea on CPAP who is coming from [facility] after presenting there with several days of dizziness, he had no other symptoms, he was found to be profoundly bradycardic, cardiology was consulted and he was admitted to the hospital for further care.  Note he takes extremely high doses of Coreg at home.   Subjective:    Anthony Chapman today has, No headache, No chest pain, No abdominal pain - No Nausea, No new weakness tingling or numbness, no SOB.   Assessment  & Plan :     1.  Symptomatic bradycardia causing dizziness in a patient who takes 25 mg of Coreg twice daily with stable TSH and history of CAD with multiple PCI's in the past. - his TSH appears to be stable, Coreg held, blood pressure currently stable and at rest he is not dizzy, cardiology has been consulted will monitor with supportive care.  He is already received calcium gluconate and glucagon.  Defer further management to cardiology, will monitor chronotropic response.  2.  CAD.  Beta-blocker held, continue aspirin, statin and Imdur for secondary prevention.  Chest pain-free, mild non-ACS pattern borderline troponin rise.  Insignificant.  3.  Dyslipidemia.  On  statin.  4.  Hypertension currently on combination of Norvasc or add hydralazine for better control as Coreg on hold.  5. OSA - QHS CPAP.        Condition - Extremely Guarded  Family Communication  : Wife Enid Derry [phone] on 09/30/2020.  Code Status :  Full  Consults  :  Cards  PUD Prophylaxis :  None   Procedures  :            Disposition Plan  :    Status is: Inpatient  Remains inpatient appropriate because:IV treatments appropriate due to intensity of illness or inability to take PO   Dispo: The patient is from: Home              Anticipated d/c is to: Home              Anticipated d/c date is: 3 days              Patient currently is not medically stable to d/c.   Difficult to place patient No  DVT Prophylaxis  :  Lovenox    Lab Results  Component Value Date   PLT 150 09/30/2020    Diet :  Diet Order            Diet heart healthy/carb modified Room service appropriate? Yes; Fluid consistency: Thin  Diet effective now                  Inpatient Medications  Scheduled Meds: . amLODipine  10 mg Oral Daily  . aspirin  81 mg Oral Daily  . enoxaparin (LOVENOX) injection  40 mg Subcutaneous Q24H  . hydrALAZINE  50 mg Oral Q8H  . isosorbide mononitrate  60 mg Oral Daily  . loratadine  10 mg Oral Daily  . rosuvastatin  40 mg Oral Daily   Continuous Infusions:  PRN Meds:.acetaminophen **OR** acetaminophen, montelukast, nitroGLYCERIN, ondansetron **OR** ondansetron (ZOFRAN) IV  Antibiotics  :    Anti-infectives (From admission, onward)   None       Time Spent in minutes  30   Lala Lund M.D on 09/30/2020 at 12:15 PM  To page go to [email or web address]   [facility] -  Office  [phone]     See all Orders from today for further details    Objective:   Vitals:   09/30/20 0200 09/30/20 0300 09/30/20 0718 09/30/20 0922  BP: 131/69 133/60 (!) 143/96 (!) 162/65  Pulse: (!) 32 (!) 33 (!) 40   Resp: 15 19 14    Temp: 98.3 F  (36.8 C) 98.2 F (36.8 C) 98.3 F (36.8 C)   TempSrc: Oral Oral Oral   SpO2: 91% 92% 91%   Weight:      Height:        Wt Readings from Last 3 Encounters:  09/29/20 111.2 kg  09/03/20 111.6 kg  08/21/20 111.6 kg     Intake/Output Summary (Last 24 hours) at 09/30/2020 1215 Last data filed at 09/30/2020 0810 Gross per 24 hour  Intake 1195.58 ml  Output 225 ml  Net 970.58 ml     Physical Exam  Awake Alert, No new F.N deficits, Normal affect [place].AT,PERRAL Supple Neck,No JVD, No cervical lymphadenopathy appriciated.  Symmetrical Chest wall movement, Good air movement bilaterally, CTAB RRR,No Gallops,Rubs or new Murmurs, No Parasternal Heave +ve B.Sounds, Abd Soft, No tenderness, No organomegaly appriciated, No rebound - guarding or rigidity. No Cyanosis, Clubbing or edema, No new Rash or bruise       Data Review:    CBC Recent Labs  Lab 09/29/20 1200 09/30/20 0045  WBC 5.3 4.6  HGB 13.4 13.0  HCT 42.4 40.2  PLT 149* 150  MCV 81.4 80.4  MCH 25.7* 26.0  MCHC 31.6 32.3  RDW 15.4 15.5  LYMPHSABS 1.4  --   MONOABS 0.6  --   EOSABS 0.2  --  BASOSABS 0.0  --     Recent Labs  Lab 09/29/20 1200 09/30/20 0045  NA 136 139  K 3.3* 3.5  CL 104 108  CO2 23 23  GLUCOSE 131* 140*  BUN 19 16  CREATININE 0.90 0.86  CALCIUM 8.3* 8.3*  AST  --  26  ALT  --  39  ALKPHOS  --  95  BILITOT  --  0.7  ALBUMIN  --  2.8*  MG 1.9  --   TSH  --  2.619  HGBA1C  --  6.6*    ------------------------------------------------------------------------------------------------------------------ No results for input(s): CHOL, HDL, LDLCALC, TRIG, CHOLHDL, LDLDIRECT in the last 72 hours.  Lab Results  Component Value Date   HGBA1C 6.6 (H) 09/30/2020   ------------------------------------------------------------------------------------------------------------------ Recent Labs    09/30/20 0045  TSH 2.619    Cardiac Enzymes No results for input(s): CKMB, TROPONINI,  MYOGLOBIN in the last 168 hours.  Invalid input(s): CK ------------------------------------------------------------------------------------------------------------------ No results found for: BNP  Micro Results Recent Results (from the past 240 hour(s))  Resp Panel by RT-PCR (Flu A&B, Covid) Nasopharyngeal Swab     Status: None   Collection Time: 09/29/20 10:32 AM   Specimen: Nasopharyngeal Swab; Nasopharyngeal(NP) swabs in vial transport medium  Result Value Ref Range Status   SARS Coronavirus 2 by RT PCR NEGATIVE NEGATIVE Final    Comment: (NOTE) SARS-CoV-2 target nucleic acids are NOT DETECTED.  The SARS-CoV-2 RNA is generally detectable in upper respiratory specimens during the acute phase of infection. The lowest concentration of SARS-CoV-2 viral copies this assay can detect is 138 copies/mL. A negative result does not preclude SARS-Cov-2 infection and should not be used as the sole basis for treatment or other patient management decisions. A negative result may occur with  improper specimen collection/handling, submission of specimen other than nasopharyngeal swab, presence of viral mutation(s) within the areas targeted by this assay, and inadequate number of viral copies(<138 copies/mL). A negative result must be combined with clinical observations, patient history, and epidemiological information. The expected result is Negative.  Fact Sheet for Patients:  EntrepreneurPulse.com.au  Fact Sheet for Healthcare Providers:  IncredibleEmployment.be  This test is no t yet approved or cleared by the [place] FDA and  has been authorized for detection and/or diagnosis of SARS-CoV-2 by FDA under an Emergency Use Authorization (EUA). This EUA will remain  in effect (meaning this test can be used) for the duration of the COVID-19 declaration under Section 564(b)(1) of the Act, 21 U.S.C.section 360bbb-3(b)(1), unless the authorization is  terminated  or revoked sooner.       Influenza A by PCR NEGATIVE NEGATIVE Final   Influenza B by PCR NEGATIVE NEGATIVE Final    Comment: (NOTE) The Xpert Xpress SARS-CoV-2/FLU/RSV plus assay is intended as an aid in the diagnosis of influenza from Nasopharyngeal swab specimens and should not be used as a sole basis for treatment. Nasal washings and aspirates are unacceptable for Xpert Xpress SARS-CoV-2/FLU/RSV testing.  Fact Sheet for Patients: EntrepreneurPulse.com.au  Fact Sheet for Healthcare Providers: IncredibleEmployment.be  This test is not yet approved or cleared by the [place] FDA and has been authorized for detection and/or diagnosis of SARS-CoV-2 by FDA under an Emergency Use Authorization (EUA). This EUA will remain in effect (meaning this test can be used) for the duration of the COVID-19 declaration under Section 564(b)(1) of the Act, 21 U.S.C. section 360bbb-3(b)(1), unless the authorization is terminated or revoked.  Performed at [facility], [place],  [place], [place] [address]   MRSA PCR Screening     Status: None   Collection Time: 09/29/20  8:58 PM   Specimen: Nasopharyngeal  Result Value Ref Range Status   MRSA by PCR NEGATIVE NEGATIVE Final    Comment:        The GeneXpert MRSA Assay (FDA approved for NASAL specimens only), is one component of a comprehensive MRSA colonization surveillance program. It is not intended to diagnose MRSA infection nor to guide or monitor treatment for MRSA infections. Performed at [facility], [place] [address] [place], [place] [address]     Radiology Reports DG Chest Portable 1 View  Result Date: 09/29/2020 CLINICAL DATA:  Near syncope, dizziness EXAM: PORTABLE CHEST 1 VIEW COMPARISON:  None. FINDINGS: Normal heart size. Normal mediastinal contour. No pneumothorax. No pleural effusion. Lungs appear clear, with no acute consolidative airspace disease  and no pulmonary edema. IMPRESSION: No active disease. Electronically Signed   By: Ilona Sorrel M.D.   On: 09/29/2020 11:21

## 2020-09-30 NOTE — Progress Notes (Signed)
OT Cancellation Note  Patient Details Name: Anthony Chapman MRN: [id number] DOB: [birth year]   Cancelled Treatment:    Reason Eval/Treat Not Completed: Medical issues which prohibited therapy. RN attempted to walk and HR decreased to 24 and + for orthostatics. Please hold 2/22 per RN.  [place] 09/30/2020, 1:12 PM   Jesse Sans OTR/L Acute Rehabilitation Services Pager: [phone] Office: [phone]

## 2020-09-30 NOTE — Progress Notes (Signed)
Patient's HR got as low as 22 on the monitor. Patient states he felt dizziness when it occurred. No other symptoms and all other vital signs stable at this time. Dr. Olevia Bowens with [facility] paged. PRN Atropine ordered. Will continue to monitor the patient.

## 2020-09-30 NOTE — Telephone Encounter (Signed)
Noted. Agree. Thanks.

## 2020-09-30 NOTE — Consult Note (Addendum)
ELECTROPHYSIOLOGY CONSULT NOTE    Patient ID: Anthony Chapman MRN: [id number], DOB/AGE: Dec 05, 1950 70 y.o.  Admit date: 09/29/2020 Date of Consult: 09/30/2020  Primary Physician: Cassandria Anger, MD Primary Cardiologist: Peter Martinique, MD  Electrophysiologist: New (Previously seen by Dr. Rayann Heman but as gen cards round)  Referring Provider: Dr. Candiss Norse  Patient Profile: Anthony Chapman is a 70 y.o. male with a history of seasonal allergies, CAD, history of MI, LBBB, osteoarthritis of multiple sites, ED, HTN, GERD, HLD, sleep apnea on CPAP who is being seen today for the evaluation of CHB at the request of Dr. Candiss Norse and Dr. Martinique.  HPI:  Anthony Chapman is a 70 y.o. male with medical history as above. He presented with several days of dizziness, and at least one syncopal episode and was found to be in CHB. He has had multiple pauses > 4 seconds and HRs in the 20s at times requiring atropine.  Last dose of coreg 2/21 am dose.   His symptoms began Friday when he had a period of lightheadedness. His wife told him his eyes rolled back in his head and he didn't respond to voice for a moment. He had another episode of near syncope Sunday.   He presented to [facility] yesterday evening with continued symptoms and found to be in CHB on arrival.   Pertinent labs include K 3.5 (supped), Cr 0.86, WBC 4.6, Hgb 13.0, Mg 1.9 (supped). COVID negative.   He is sitting on the edge of the bed on my arrival. He continues to have intermittent lightheadedness, especially when he is ambulating. He repeats store above of syncope Friday. None prior to that, and no frank syncope since.   LHC 05/2020 Prox RCA lesion is 30% stenosed.  Dist RCA lesion is 40% stenosed.  RPAV lesion is 100% stenosed.  Prox LAD to Mid LAD lesion is 25% stenosed.  Ramus lesion is 50% stenosed.  Ost Cx to Dist Cx lesion is 10% stenosed.  The left ventricular systolic function is normal.  LV end diastolic pressure is  normal.  The left ventricular ejection fraction is 55-65% by visual estimate.   1. Single vessel occlusive CAD involving the PL branch of the RCA. This is well collateralized. 2. Patent stents in the LCx 3. Good LV function 4. Normal LVEDP   Past Medical History:  Diagnosis Date  . Allergy    Spring- mild   . Anginal pain ([place])   . Arthritis    "all over" (08/27/2016)  . CAD (coronary artery disease)    a.  stent placement to the RCA in 2010 b. cath in 2015 with PCI to the RCA (2.75mm x67mm Resolute DES) and LCx (3.20mm x26mm Resolute DES).  c. 08/2016: cath showing 100% ISR of d-RCA with collaterals, 10% Ost LAD ISR, 90% mid-Cx stenosis (3.0x53mm Resolute DES placed)  . ED (erectile dysfunction)   . GERD (gastroesophageal reflux disease)   . HTN (hypertension)   . Hyperlipemia   . LBBB (left bundle branch block)   . Myocardial infarction ([place]) 2010; 2015  . Sleep apnea    wears cpap      Surgical History:  Past Surgical History:  Procedure Laterality Date  . CARDIAC CATHETERIZATION  09/2013  . CARDIAC CATHETERIZATION N/A 08/27/2016   Procedure: Left Heart Cath and Coronary Angiography;  Surgeon: Leonie Man, MD;  Location: [place] [facility];  Service: Cardiovascular;  Laterality: N/A;  . CARDIAC CATHETERIZATION N/A 08/27/2016   Procedure: Coronary Stent Intervention;  Surgeon: Leonie Man, MD;  Location: [place] [facility];  Service: Cardiovascular;  Laterality: N/A;  . COLONOSCOPY  2004  . CORONARY ANGIOPLASTY WITH STENT PLACEMENT  2010; 1/192018  . HERNIA REPAIR    . INGUINAL HERNIA REPAIR Left   . LEFT HEART CATH AND CORONARY ANGIOGRAPHY N/A 05/20/2020   Procedure: LEFT HEART CATH AND CORONARY ANGIOGRAPHY;  Surgeon: Martinique, Peter M, MD;  Location: [place] [facility];  Service: Cardiovascular;  Laterality: N/A;  . UMBILICAL HERNIA REPAIR       Medications Prior to Admission  Medication Sig Dispense Refill Last Dose  . amLODipine (NORVASC) 10 MG tablet TAKE 1  TABLET(10 MG) BY MOUTH DAILY (Patient taking differently: Take 10 mg by mouth daily.) 90 tablet 1 09/29/2020 at Unknown time  . aspirin 81 MG EC tablet Take 1 tablet (81 mg total) by mouth daily. 30 tablet 0 09/29/2020 at Unknown time  . carvedilol (COREG) 25 MG tablet TAKE 1 TABLET(25 MG) BY MOUTH TWICE DAILY WITH A MEAL (Patient taking differently: Take 25 mg by mouth 2 (two) times daily with a meal.) 60 tablet 2 09/29/2020 at 0830  . cetirizine (ZYRTEC) 10 MG tablet Take 10 mg by mouth daily as needed for allergies.   Past Week at Unknown time  . isosorbide mononitrate (IMDUR) 60 MG 24 hr tablet Take 1 tablet (60 mg total) by mouth daily. 90 tablet 3 09/28/2020 at Unknown time  . montelukast (SINGULAIR) 10 MG tablet Take 10 mg by mouth daily as needed (allergies).   Past Week at Unknown time  . nitroGLYCERIN (NITROSTAT) 0.4 MG SL tablet DISSOLVE 1 TABLET UNDER THE TONGUE EVERY 5 MINUTES AS  NEEDED FOR CHEST PAIN. MAX  OF 3 TABLETS IN 15 MINUTES. CALL 911 IF PAIN PERSISTS. (Patient taking differently: Place under the tongue See admin instructions. Dissolve one tablet under the tongues every 5 minutes as needed for chest pain. Max of 3 tablets in 15 minutes. Call 911 if pain persists.) 100 tablet 3 Past Week at Unknown time  . potassium chloride (KLOR-CON) 8 MEQ tablet Take 8 mEq by mouth at bedtime.   09/28/2020 at Unknown time  . rosuvastatin (CRESTOR) 40 MG tablet Take 40 mg by mouth daily.   09/29/2020 at Unknown time  . VITAMIN D, CHOLECALCIFEROL, PO Take 1 tablet by mouth daily as needed (vitamin d supplement).   Past Month at Unknown time    Inpatient Medications:  . amLODipine  10 mg Oral Daily  . aspirin  81 mg Oral Daily  . enoxaparin (LOVENOX) injection  40 mg Subcutaneous Q24H  . hydrALAZINE  50 mg Oral Q8H  . isosorbide mononitrate  60 mg Oral Daily  . loratadine  10 mg Oral Daily  . rosuvastatin  40 mg Oral Daily    Allergies:  Allergies  Allergen Reactions  . Losartan Other (See  Comments)    angioedema    Social History   Socioeconomic History  . Marital status: Married    Spouse name: Not on file  . Number of children: Not on file  . Years of education: Not on file  . Highest education level: Not on file  Occupational History  . 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 Smoking status: Current Some Day Smoker    Packs/day: 1.00    Years: 30.00    Pack years: 30.00    Types: Cigarettes, Cigars    Last attempt to quit: 08/16/1998  Years since quitting: 22.1  . Smokeless tobacco: Never Used  Substance and Sexual Activity  . Alcohol use: Yes    Comment: rare beer   . Drug use: No  . Sexual activity: Yes  Other Topics Concern  . Not on file  Social History Narrative   Occupation: 2nd shift   Married.         Social Determinants of Health   Financial Resource Strain: Not on file  Food Insecurity: Not on file  Transportation Needs: Not on file  Physical Activity: Not on file  Stress: Not on file  Social Connections: Not on file  Intimate Partner Violence: Not on file     Family History  Problem Relation Age of Onset  . Hypertension Mother   . CAD Mother   . Hypertension Father   . Arthritis Father   . Hypertension Other   . CAD Maternal Aunt   . Colon cancer Neg Hx   . Colon polyps Neg Hx   . Esophageal cancer Neg Hx   . Rectal cancer Neg Hx   . Stomach cancer Neg Hx      Review of Systems: All other systems reviewed and are otherwise negative except as noted above.  Physical Exam: Vitals:   09/30/20 0200 09/30/20 0300 09/30/20 0718 09/30/20 0922  BP: 131/69 133/60 (!) 143/96 (!) 162/65  Pulse: (!) 32 (!) 33 (!) 40   Resp: 15 19 14    Temp: 98.3 F (36.8 C) 98.2 F (36.8 C) 98.3 F (36.8 C)   TempSrc: Oral Oral Oral   SpO2: 91% 92% 91%   Weight:      Height:        GEN- The patient is well appearing, alert and oriented x 3 today.   HEENT: normocephalic, atraumatic; sclera clear, conjunctiva pink;  hearing intact; oropharynx clear; neck supple Lungs- Clear to ausculation bilaterally, normal work of breathing.  No wheezes, rales, rhonchi Heart- Regular rate and rhythm, no murmurs, rubs or gallops GI- soft, non-tender, non-distended, bowel sounds present Extremities- no clubbing, cyanosis, or edema; DP/PT/radial pulses 2+ bilaterally MS- no significant deformity or atrophy Skin- warm and dry, no rash or lesion Psych- euthymic mood, full affect Neuro- strength and sensation are intact  Labs:   Lab Results  Component Value Date   WBC 4.6 09/30/2020   HGB 13.0 09/30/2020   HCT 40.2 09/30/2020   MCV 80.4 09/30/2020   PLT 150 09/30/2020    Recent Labs  Lab 09/30/20 0045  NA 139  K 3.5  CL 108  CO2 23  BUN 16  CREATININE 0.86  CALCIUM 8.3*  PROT 6.9  BILITOT 0.7  ALKPHOS 95  ALT 39  AST 26  GLUCOSE 140*      Radiology/Studies: DG Chest Portable 1 View  Result Date: 09/29/2020 CLINICAL DATA:  Near syncope, dizziness EXAM: PORTABLE CHEST 1 VIEW COMPARISON:  None. FINDINGS: Normal heart size. Normal mediastinal contour. No pneumothorax. No pleural effusion. Lungs appear clear, with no acute consolidative airspace disease and no pulmonary edema. IMPRESSION: No active disease. Electronically Signed   By: Ilona Sorrel M.D.   On: 09/29/2020 11:21    EKG: on admission shows CHB at 38 bpm with wide QRS (personally reviewed) Baseline EKG 05/19/2020 shows NSR at 76 with LBBB at 154 ms  TELEMETRY: shows CHB in 30s with occasional pauses as long as 4.5 seconds (personally reviewed)  Assessment/Plan: 1.  Complete Heart Block Underlying LBBB in setting of CAD No ischemic symptoms. HS  trop 40 -> 33. Last dose of coreg 25 mg BID yesterday am.  He has had syncope and continue to have high grade HB.  Explained risks, benefits, and alternatives to PPM implantation, including but not limited to bleeding, infection, pneumothorax, pericardial effusion, lead dislodgement, heart attack,  stroke, or death.  Pt verbalized understanding and agrees to proceed pending timing discussion with MD No formal Echo. LV gram 05/2020 with normal EF.   2. CAD Non ischemic HS troponin pattern Patent stent by cath 05/2020 LBBB at baseline.  Formal echo pending, LV gram with normal EF as above.   3. HTN Will likely need coreg added back once paced/if paced. Would add hydral/imdur especially if EF down.   Suspect patient will need pacing given chronic LBBB. If EF down he may need CRT-P.   ADDENDUM Dr. Rayann Heman has seen the patient. Patient has eaten lunch and is currently hemodynamically stable despite ongoing CHB. Will plan for pacing tomorrow. Dr Harrell Gave has read Echo as 60-65% (report pending)  For questions or updates, please contact [place] Please consult [email or web address] for contact info under Cardiology/STEMI.  Signed, Shirley Friar, PA-C  09/30/2020 12:43 PM   I have seen, examined the patient, and reviewed the above assessment and plan.  Changes to above are made where necessary.  On exam, bradycardic rhythm. AV block persists despite coreg washout.  EF is preserved.  Unfortunately, he was given lunch today.  We will therefore plan PPM implant tomorrow.  Risks, benefits, alternatives to pacemaker implantation were discussed in detail with the patient today. The patient understands that the risks include but are not limited to bleeding, infection, pneumothorax, perforation, tamponade, vascular damage, renal failure, MI, stroke, death,  and lead dislodgement and wishes to proceed. We will therefore schedule the procedure tomorrow afternoon.  Co Sign: [name], MD 09/30/2020

## 2020-10-01 ENCOUNTER — Telehealth: Payer: Self-pay

## 2020-10-01 ENCOUNTER — Encounter (HOSPITAL_COMMUNITY)
Admission: EM | Disposition: A | Discharge status: Home / Self Care | Payer: Self-pay | Source: Home / Self Care | Attending: Internal Medicine

## 2020-10-01 HISTORY — PX: PACEMAKER IMPLANT: EP1218

## 2020-10-01 LAB — COMPREHENSIVE METABOLIC PANEL
ALT: 34 U/L (ref 0–44)
AST: 22 U/L (ref 15–41)
Albumin: 2.9 g/dL — ABNORMAL LOW (ref 3.5–5.0)
Alkaline Phosphatase: 92 U/L (ref 38–126)
Anion gap: 8 (ref 5–15)
BUN: 16 mg/dL (ref 8–23)
CO2: 25 mmol/L (ref 22–32)
Calcium: 8.4 mg/dL — ABNORMAL LOW (ref 8.9–10.3)
Chloride: 106 mmol/L (ref 98–111)
Creatinine, Ser: 0.93 mg/dL (ref 0.61–1.24)
GFR, Estimated: >60 mL/min (ref >60)
Glucose, Bld: 128 mg/dL — ABNORMAL HIGH (ref 70–99)
Potassium: 3.7 mmol/L (ref 3.5–5.1)
Sodium: 139 mmol/L (ref 135–145)
Total Bilirubin: 0.9 mg/dL (ref 0.3–1.2)
Total Protein: 7 g/dL (ref 6.5–8.1)

## 2020-10-01 LAB — CBC WITH DIFFERENTIAL/PLATELET
Abs Immature Granulocytes: 0 10*3/uL (ref 0.00–0.07)
Basophils Absolute: 0 10*3/uL (ref 0.0–0.1)
Basophils Relative: 1 %
Eosinophils Absolute: 0.4 10*3/uL (ref 0.0–0.5)
Eosinophils Relative: 8 %
HCT: 40 % (ref 39.0–52.0)
Hemoglobin: 12.9 g/dL — ABNORMAL LOW (ref 13.0–17.0)
Immature Granulocytes: 0 %
Lymphocytes Relative: 25 %
Lymphs Abs: 1.2 10*3/uL (ref 0.7–4.0)
MCH: 26.4 pg (ref 26.0–34.0)
MCHC: 32.3 g/dL (ref 30.0–36.0)
MCV: 81.8 fL (ref 80.0–100.0)
Monocytes Absolute: 0.5 10*3/uL (ref 0.1–1.0)
Monocytes Relative: 11 %
Neutro Abs: 2.6 10*3/uL (ref 1.7–7.7)
Neutrophils Relative %: 55 %
Platelets: 139 10*3/uL — ABNORMAL LOW (ref 150–400)
RBC: 4.89 MIL/uL (ref 4.22–5.81)
RDW: 15.6 % — ABNORMAL HIGH (ref 11.5–15.5)
WBC: 4.6 10*3/uL (ref 4.0–10.5)
nRBC: 0 % (ref 0.0–0.2)

## 2020-10-01 LAB — GLUCOSE, CAPILLARY
Glucose-Capillary: 101 mg/dL — ABNORMAL HIGH (ref 70–99)
Glucose-Capillary: 100 mg/dL — ABNORMAL HIGH (ref 70–99)
Glucose-Capillary: 143 mg/dL — ABNORMAL HIGH (ref 70–99)

## 2020-10-01 LAB — MAGNESIUM: Magnesium: 2 mg/dL (ref 1.7–2.4)

## 2020-10-01 LAB — BRAIN NATRIURETIC PEPTIDE: B Natriuretic Peptide: 75.4 pg/mL (ref 0.0–100.0)

## 2020-10-01 SURGERY — PACEMAKER IMPLANT
Anesthesia: LOCAL

## 2020-10-01 MED ORDER — HYDROCODONE-ACETAMINOPHEN 5-325 MG PO TABS
1.0000 | ORAL_TABLET | ORAL | Status: DC | PRN
  Administered 2020-10-01 – 2020-10-02 (×2): 1 via ORAL
  Filled 2020-10-01: qty 2
  Filled 2020-10-01: qty 1

## 2020-10-01 MED ORDER — ACETAMINOPHEN 325 MG PO TABS
325.0000 mg | ORAL_TABLET | ORAL | Status: DC | PRN
  Administered 2020-10-02: 650 mg via ORAL
  Filled 2020-10-01: qty 2

## 2020-10-01 MED ORDER — CHLORHEXIDINE GLUCONATE 4 % EX LIQD
CUTANEOUS | Status: AC
  Administered 2020-10-01: 4 via TOPICAL
  Filled 2020-10-01: qty 15

## 2020-10-01 MED ORDER — LIDOCAINE HCL (PF) 1 % IJ SOLN
INTRAMUSCULAR | Status: DC | PRN
  Administered 2020-10-01: 60 mL

## 2020-10-01 MED ORDER — HEPARIN (PORCINE) IN NACL 1000-0.9 UT/500ML-% IV SOLN
INTRAVENOUS | Status: AC
  Filled 2020-10-01: qty 500

## 2020-10-01 MED ORDER — CEFAZOLIN SODIUM-DEXTROSE 2-4 GM/100ML-% IV SOLN
INTRAVENOUS | Status: AC
  Filled 2020-10-01: qty 100

## 2020-10-01 MED ORDER — LIDOCAINE HCL (PF) 1 % IJ SOLN
INTRAMUSCULAR | Status: AC
  Filled 2020-10-01: qty 60

## 2020-10-01 MED ORDER — MIDAZOLAM HCL 5 MG/5ML IJ SOLN
INTRAMUSCULAR | Status: AC
  Filled 2020-10-01: qty 5

## 2020-10-01 MED ORDER — ONDANSETRON HCL 4 MG/2ML IJ SOLN: 4.0000 mg | Freq: Four times a day (QID) | INTRAMUSCULAR | Status: DC | PRN

## 2020-10-01 MED ORDER — SODIUM CHLORIDE 0.9 % IV SOLN
INTRAVENOUS | Status: AC
  Filled 2020-10-01: qty 2

## 2020-10-01 MED ORDER — CEFAZOLIN SODIUM-DEXTROSE 1-4 GM/50ML-% IV SOLN
1.0000 g | Freq: Four times a day (QID) | INTRAVENOUS | Status: AC
  Administered 2020-10-01 – 2020-10-02 (×3): 1 g via INTRAVENOUS
  Filled 2020-10-01 (×3): qty 50

## 2020-10-01 MED ORDER — HEPARIN (PORCINE) IN NACL 1000-0.9 UT/500ML-% IV SOLN
INTRAVENOUS | Status: DC | PRN
  Administered 2020-10-01: 500 mL

## 2020-10-01 MED ORDER — SODIUM CHLORIDE 0.9% FLUSH: 3.0000 mL | INTRAVENOUS | Status: DC | PRN

## 2020-10-01 MED ORDER — MIDAZOLAM HCL 5 MG/5ML IJ SOLN
INTRAMUSCULAR | Status: DC | PRN
  Administered 2020-10-01 (×3): 1 mg via INTRAVENOUS

## 2020-10-01 MED ORDER — SODIUM CHLORIDE 0.9 % IV SOLN: 250.0000 mL | INTRAVENOUS | Status: DC | PRN

## 2020-10-01 MED ORDER — SODIUM CHLORIDE 0.9% FLUSH
3.0000 mL | Freq: Two times a day (BID) | INTRAVENOUS | Status: DC
  Administered 2020-10-01 – 2020-10-02 (×2): 3 mL via INTRAVENOUS

## 2020-10-01 MED ORDER — FENTANYL CITRATE (PF) 100 MCG/2ML IJ SOLN
INTRAMUSCULAR | Status: AC
  Filled 2020-10-01: qty 2

## 2020-10-01 MED ORDER — FENTANYL CITRATE (PF) 100 MCG/2ML IJ SOLN
INTRAMUSCULAR | Status: DC | PRN
  Administered 2020-10-01 (×2): 25 ug via INTRAVENOUS

## 2020-10-01 MED ORDER — IOHEXOL 350 MG/ML SOLN
INTRAVENOUS | Status: DC | PRN
  Administered 2020-10-01: 15 mL

## 2020-10-01 MED ORDER — LIDOCAINE HCL (PF) 1 % IJ SOLN
INTRAMUSCULAR | Status: AC
  Filled 2020-10-01: qty 30

## 2020-10-01 SURGICAL SUPPLY — 9 items
CABLE SURGICAL S-101-97-12 (CABLE) ×2 IMPLANT
LEAD TENDRIL MRI 52CM LPA1200M (Lead) ×1 IMPLANT
LEAD TENDRIL MRI 58CM LPA1200M (Lead) ×1 IMPLANT
MAT PREVALON FULL STRYKER (MISCELLANEOUS) ×1 IMPLANT
PACEMAKER ASSURITY DR-RF (Pacemaker) ×1 IMPLANT
PAD PRO RADIOLUCENT 2001M-C (PAD) ×2 IMPLANT
SHEATH 8FR PRELUDE SNAP 13 (SHEATH) ×2 IMPLANT
SHEATH PINNACLE 6F 10CM (SHEATH) ×1 IMPLANT
TRAY PACEMAKER INSERTION (PACKS) ×2 IMPLANT

## 2020-10-01 NOTE — H&P (View-Only) (Signed)
Electrophysiology Rounding Note  Patient Name: Anthony Chapman Date of Encounter: 10/01/2020  Primary Cardiologist: Peter Martinique, MD Electrophysiologist: New  Subjective   No new complaints this am. Still mild lightheadedness at times. "I didn't touch my (clear) breakfast yet just in case".    Inpatient Medications    Scheduled Meds: . amLODipine  10 mg Oral Daily  . aspirin  81 mg Oral Daily  . chlorhexidine  60 mL Topical Once  . gentamicin irrigation  80 mg Irrigation On Call  . hydrALAZINE  50 mg Oral Q8H  . isosorbide mononitrate  60 mg Oral Daily  . loratadine  10 mg Oral Daily  . rosuvastatin  40 mg Oral Daily   Continuous Infusions: . sodium chloride 50 mL/hr at 09/30/20 2208  .  ceFAZolin (ANCEF) IV     PRN Meds: acetaminophen **OR** acetaminophen, montelukast, nitroGLYCERIN, ondansetron **OR** ondansetron (ZOFRAN) IV   Vital Signs    Vitals:   09/30/20 1900 09/30/20 2100 09/30/20 2200 10/01/20 0612  BP: (!) 150/65 (!) 147/71  (!) 161/85  Pulse: (!) 36 (!) 40 (!) 37 (!) 40  Resp: 18 19 14 17   Temp:    (!) 97.3 F (36.3 C)  TempSrc:    Axillary  SpO2: 92% 92% 94% 97%  Weight:    111.5 kg  Height:        Intake/Output Summary (Last 24 hours) at 10/01/2020 0717 Last data filed at 09/30/2020 1500 Gross per 24 hour  Intake 404.47 ml  Output -  Net 404.47 ml   Filed Weights   09/29/20 1021 09/29/20 2055 10/01/20 0612  Weight: 111.6 kg 111.2 kg 111.5 kg    Physical Exam    GEN- The patient is well appearing, alert and oriented x 3 today.   Head- normocephalic, atraumatic Eyes-  Sclera clear, conjunctiva pink Ears- hearing intact Oropharynx- clear Neck- supple Lungs- Clear to ausculation bilaterally, normal work of breathing Heart- Slow and regular rate and rhythm, no murmurs, rubs or gallops GI- soft, NT, ND, + BS Extremities- no clubbing or cyanosis. No edema Skin- no rash or lesion Psych- euthymic mood, full affect Neuro- strength and  sensation are intact  Labs    CBC Recent Labs    09/29/20 1200 09/30/20 0045 10/01/20 0035  WBC 5.3 4.6 4.6  NEUTROABS 3.0  --  2.6  HGB 13.4 13.0 12.9*  HCT 42.4 40.2 40.0  MCV 81.4 80.4 81.8  PLT 149* 150 500*   Basic Metabolic Panel Recent Labs    09/29/20 1200 09/30/20 0045 10/01/20 0035  NA 136 139 139  K 3.3* 3.5 3.7  CL 104 108 106  CO2 23 23 25   GLUCOSE 131* 140* 128*  BUN 19 16 16   CREATININE 0.90 0.86 0.93  CALCIUM 8.3* 8.3* 8.4*  MG 1.9  --  2.0  PHOS  --  2.5  --    Liver Function Tests Recent Labs    09/30/20 0045 10/01/20 0035  AST 26 22  ALT 39 34  ALKPHOS 95 92  BILITOT 0.7 0.9  PROT 6.9 7.0  ALBUMIN 2.8* 2.9*   No results for input(s): LIPASE, AMYLASE in the last 72 hours. Cardiac Enzymes No results for input(s): CKTOTAL, CKMB, CKMBINDEX, TROPONINI in the last 72 hours.   Telemetry    Still in at least Mob[place] II HB with periods of clear CHB, rates in 30s lower 40s. (personally reviewed)  Radiology    DG Chest Portable 1 View  Result Date:  09/29/2020 CLINICAL DATA:  Near syncope, dizziness EXAM: PORTABLE CHEST 1 VIEW COMPARISON:  None. FINDINGS: Normal heart size. Normal mediastinal contour. No pneumothorax. No pleural effusion. Lungs appear clear, with no acute consolidative airspace disease and no pulmonary edema. IMPRESSION: No active disease. Electronically Signed   By: Ilona Sorrel M.D.   On: 09/29/2020 11:21   ECHOCARDIOGRAM COMPLETE  Result Date: 09/30/2020    ECHOCARDIOGRAM REPORT   Patient Name:   Anthony Chapman Date of Exam: 09/30/2020 Medical Rec #:  [id number]       Height:       69.0 in Accession #:    [id number]      Weight:       245.1 lb Date of Birth:  12-01-1950        BSA:          2.253 m Patient Age:    70 years        BP:           162/65 mmHg Patient Gender: M               HR:           38 bpm. Exam Location:  Inpatient Procedure: 2D Echo, 3D Echo, Cardiac Doppler and Color Doppler                          STAT ECHO   Findings communicated with Oda Kilts by phone at 2:26 PM. Indications:    I44.2 Complete heart block  History:        Patient has no prior history of Echocardiogram examinations.                 CAD, Abnormal ECG and Bradycardia, Signs/Symptoms:Syncope; Risk                 Factors:Hypertension and Diabetes.  Sonographer:    Roseanna Rainbow RDCS Referring Phys: [id number] [place]  Sonographer Comments: Technically difficult study due to poor echo windows and suboptimal parasternal window. IMPRESSIONS  1. Left ventricular ejection fraction, by estimation, is 60 to 65%. The left ventricle has normal function. The left ventricle has no regional wall motion abnormalities. There is mild concentric left ventricular hypertrophy. Left ventricular diastolic parameters were normal.  2. Right ventricular systolic function is mildly reduced. The right ventricular size is mildly enlarged.  3. The mitral valve is normal in structure. Trivial mitral valve regurgitation. No evidence of mitral stenosis.  4. The aortic valve is tricuspid. Aortic valve regurgitation is not visualized. No aortic stenosis is present.  5. The inferior vena cava is normal in size with <50% respiratory variability, suggesting right atrial pressure of 8 mmHg. Comparison(s): No prior Echocardiogram. Conclusion(s)/Recommendation(s): Normal biventricular function without evidence of hemodynamically significant valvular heart disease. Bradycardic throughout study. FINDINGS  Left Ventricle: Left ventricular ejection fraction, by estimation, is 60 to 65%. The left ventricle has normal function. The left ventricle has no regional wall motion abnormalities. The left ventricular internal cavity size was normal in size. There is  mild concentric left ventricular hypertrophy. Left ventricular diastolic parameters were normal. Right Ventricle: The right ventricular size is mildly enlarged. Right vetricular wall thickness was not well visualized. Right  ventricular systolic function is mildly reduced. Left Atrium: Left atrial size was normal in size. Right Atrium: Right atrial size was normal in size. Pericardium: There is no evidence of pericardial effusion. Mitral Valve: The mitral valve is normal in structure.  Trivial mitral valve regurgitation. No evidence of mitral valve stenosis. Tricuspid Valve: The tricuspid valve is normal in structure. Tricuspid valve regurgitation is not demonstrated. No evidence of tricuspid stenosis. Aortic Valve: The aortic valve is tricuspid. Aortic valve regurgitation is not visualized. No aortic stenosis is present. Pulmonic Valve: The pulmonic valve was not well visualized. Pulmonic valve regurgitation is not visualized. Aorta: The aortic root, ascending aorta, aortic arch and descending aorta are all structurally normal, with no evidence of dilitation or obstruction. Venous: The inferior vena cava is normal in size with less than 50% respiratory variability, suggesting right atrial pressure of 8 mmHg. IAS/Shunts: The atrial septum is grossly normal.  LEFT VENTRICLE PLAX 2D LVIDd:         5.20 cm LVIDs:         3.30 cm LV PW:         1.40 cm LV IVS:        1.30 cm LVOT diam:     2.40 cm LV SV:         113 LV SV Index:   50 LVOT Area:     4.52 cm  LV Volumes (MOD) LV vol d, MOD A2C: 134.0 ml LV vol d, MOD A4C: 149.0 ml LV vol s, MOD A2C: 48.1 ml LV vol s, MOD A4C: 59.2 ml LV SV MOD A2C:     85.9 ml LV SV MOD A4C:     149.0 ml LV SV MOD BP:      91.5 ml RIGHT VENTRICLE             IVC RV S prime:     15.10 cm/s  IVC diam: 1.80 cm TAPSE (M-mode): 1.6 cm LEFT ATRIUM             Index       RIGHT ATRIUM           Index LA diam:        3.60 cm 1.60 cm/m  RA Area:     18.00 cm LA Vol (A2C):   46.7 ml 20.73 ml/m RA Volume:   50.30 ml  22.33 ml/m LA Vol (A4C):   31.0 ml 13.76 ml/m LA Biplane Vol: 39.8 ml 17.67 ml/m  AORTIC VALVE LVOT Vmax:   122.00 cm/s LVOT Vmean:  93.600 cm/s LVOT VTI:    0.249 m  AORTA Ao Root diam: 3.80 cm Ao  Asc diam:  3.40 cm MITRAL VALVE MV Area (PHT): 4.15 cm     SHUNTS MV Decel Time: 183 msec     Systemic VTI:  0.25 m MV E velocity: 141.00 cm/s  Systemic Diam: 2.40 cm MV A velocity: 96.80 cm/s MV E/A ratio:  1.46 Buford Dresser MD Electronically signed by Buford Dresser MD Signature Date/Time: 09/30/2020/2:27:08 PM    Final     Patient Profile     Anthony Chapman is a 70 y.o. male with a history of seasonal allergies, CAD, history of MI, LBBB, osteoarthritis of multiple sites, ED, HTN, GERD, HLD, sleep apnea on CPAP who is being seen today for the evaluation of CHB at the request of Dr. Candiss Norse and Dr. Martinique.  Assessment & Plan    1.  Complete Heart Block Underlying LBBB in setting of CAD No ischemic symptoms. HS trop 40 -> 33. Last dose of coreg 25 mg BID 2/21 am.  He has had syncope and continues to have high grade HB.  Explained risks, benefits, and alternatives to PPM implantation, including but not  limited to bleeding, infection, pneumothorax, pericardial effusion, lead dislodgement, heart attack, stroke, or death.  Pt verbalized understanding and agrees to proceed. Plan on pacing this afternoon.  Clear breakfast. NPO after 0800. Echo 2/22 with EF 60-65%   For questions or updates, please contact [place] Please consult [email or web address] for contact info under Cardiology/STEMI.  Signed, Shirley Friar, PA-C  10/01/2020, 7:17 AM    I have seen, examined the patient, and reviewed the above assessment and plan.  Changes to above are made where necessary.  On exam, bradycardic rhythm.  The patient has symptomatic bradycardia.  His EF is preserved.   I would therefore recommend pacemaker implantation at this time.  Risks, benefits, alternatives to pacemaker implantation were discussed in detail with the patient and his wife  today. The patient and his wife understand that the risks include but are not limited to bleeding, infection, pneumothorax, perforation,  tamponade, vascular damage, renal failure, MI, stroke, death,  and lead dislodgement and wishes to proceed.     Co Sign: [name], MD 10/01/2020 1:59 PM

## 2020-10-01 NOTE — Progress Notes (Addendum)
Electrophysiology Rounding Note  Patient Name: Anthony Chapman Date of Encounter: 10/01/2020  Primary Cardiologist: Peter Martinique, MD Electrophysiologist: New  Subjective   No new complaints this am. Still mild lightheadedness at times. "I didn't touch my (clear) breakfast yet just in case".    Inpatient Medications    Scheduled Meds: . amLODipine  10 mg Oral Daily  . aspirin  81 mg Oral Daily  . chlorhexidine  60 mL Topical Once  . gentamicin irrigation  80 mg Irrigation On Call  . hydrALAZINE  50 mg Oral Q8H  . isosorbide mononitrate  60 mg Oral Daily  . loratadine  10 mg Oral Daily  . rosuvastatin  40 mg Oral Daily   Continuous Infusions: . sodium chloride 50 mL/hr at 09/30/20 2208  .  ceFAZolin (ANCEF) IV     PRN Meds: acetaminophen **OR** acetaminophen, montelukast, nitroGLYCERIN, ondansetron **OR** ondansetron (ZOFRAN) IV   Vital Signs    Vitals:   09/30/20 1900 09/30/20 2100 09/30/20 2200 10/01/20 0612  BP: (!) 150/65 (!) 147/71  (!) 161/85  Pulse: (!) 36 (!) 40 (!) 37 (!) 40  Resp: 18 19 14 17   Temp:    (!) 97.3 F (36.3 C)  TempSrc:    Axillary  SpO2: 92% 92% 94% 97%  Weight:    111.5 kg  Height:        Intake/Output Summary (Last 24 hours) at 10/01/2020 0717 Last data filed at 09/30/2020 1500 Gross per 24 hour  Intake 404.47 ml  Output -  Net 404.47 ml   Filed Weights   09/29/20 1021 09/29/20 2055 10/01/20 0612  Weight: 111.6 kg 111.2 kg 111.5 kg    Physical Exam    GEN- The patient is well appearing, alert and oriented x 3 today.   Head- normocephalic, atraumatic Eyes-  Sclera clear, conjunctiva pink Ears- hearing intact Oropharynx- clear Neck- supple Lungs- Clear to ausculation bilaterally, normal work of breathing Heart- Slow and regular rate and rhythm, no murmurs, rubs or gallops GI- soft, NT, ND, + BS Extremities- no clubbing or cyanosis. No edema Skin- no rash or lesion Psych- euthymic mood, full affect Neuro- strength and  sensation are intact  Labs    CBC Recent Labs    09/29/20 1200 09/30/20 0045 10/01/20 0035  WBC 5.3 4.6 4.6  NEUTROABS 3.0  --  2.6  HGB 13.4 13.0 12.9*  HCT 42.4 40.2 40.0  MCV 81.4 80.4 81.8  PLT 149* 150 628*   Basic Metabolic Panel Recent Labs    09/29/20 1200 09/30/20 0045 10/01/20 0035  NA 136 139 139  K 3.3* 3.5 3.7  CL 104 108 106  CO2 23 23 25   GLUCOSE 131* 140* 128*  BUN 19 16 16   CREATININE 0.90 0.86 0.93  CALCIUM 8.3* 8.3* 8.4*  MG 1.9  --  2.0  PHOS  --  2.5  --    Liver Function Tests Recent Labs    09/30/20 0045 10/01/20 0035  AST 26 22  ALT 39 34  ALKPHOS 95 92  BILITOT 0.7 0.9  PROT 6.9 7.0  ALBUMIN 2.8* 2.9*   No results for input(s): LIPASE, AMYLASE in the last 72 hours. Cardiac Enzymes No results for input(s): CKTOTAL, CKMB, CKMBINDEX, TROPONINI in the last 72 hours.   Telemetry    Still in at least Mobitz II HB with periods of clear CHB, rates in 30s lower 40s. (personally reviewed)  Radiology    DG Chest Portable 1 View  Result Date:  09/29/2020 CLINICAL DATA:  Near syncope, dizziness EXAM: PORTABLE CHEST 1 VIEW COMPARISON:  None. FINDINGS: Normal heart size. Normal mediastinal contour. No pneumothorax. No pleural effusion. Lungs appear clear, with no acute consolidative airspace disease and no pulmonary edema. IMPRESSION: No active disease. Electronically Signed   By: Ilona Sorrel M.D.   On: 09/29/2020 11:21   ECHOCARDIOGRAM COMPLETE  Result Date: 09/30/2020    ECHOCARDIOGRAM REPORT   Patient Name:   Anthony Chapman Date of Exam: 09/30/2020 Medical Rec #:  [id number]       Height:       69.0 in Accession #:    [id number]      Weight:       245.1 lb Date of Birth:  1950-12-10        BSA:          2.253 m Patient Age:    70 years        BP:           162/65 mmHg Patient Gender: M               HR:           38 bpm. Exam Location:  Inpatient Procedure: 2D Echo, 3D Echo, Cardiac Doppler and Color Doppler                          STAT ECHO   Findings communicated with Oda Kilts by phone at 2:26 PM. Indications:    I44.2 Complete heart block  History:        Patient has no prior history of Echocardiogram examinations.                 CAD, Abnormal ECG and Bradycardia, Signs/Symptoms:Syncope; Risk                 Factors:Hypertension and Diabetes.  Sonographer:    Roseanna Rainbow RDCS Referring Phys: [id number] [place]  Sonographer Comments: Technically difficult study due to poor echo windows and suboptimal parasternal window. IMPRESSIONS  1. Left ventricular ejection fraction, by estimation, is 60 to 65%. The left ventricle has normal function. The left ventricle has no regional wall motion abnormalities. There is mild concentric left ventricular hypertrophy. Left ventricular diastolic parameters were normal.  2. Right ventricular systolic function is mildly reduced. The right ventricular size is mildly enlarged.  3. The mitral valve is normal in structure. Trivial mitral valve regurgitation. No evidence of mitral stenosis.  4. The aortic valve is tricuspid. Aortic valve regurgitation is not visualized. No aortic stenosis is present.  5. The inferior vena cava is normal in size with <50% respiratory variability, suggesting right atrial pressure of 8 mmHg. Comparison(s): No prior Echocardiogram. Conclusion(s)/Recommendation(s): Normal biventricular function without evidence of hemodynamically significant valvular heart disease. Bradycardic throughout study. FINDINGS  Left Ventricle: Left ventricular ejection fraction, by estimation, is 60 to 65%. The left ventricle has normal function. The left ventricle has no regional wall motion abnormalities. The left ventricular internal cavity size was normal in size. There is  mild concentric left ventricular hypertrophy. Left ventricular diastolic parameters were normal. Right Ventricle: The right ventricular size is mildly enlarged. Right vetricular wall thickness was not well visualized. Right  ventricular systolic function is mildly reduced. Left Atrium: Left atrial size was normal in size. Right Atrium: Right atrial size was normal in size. Pericardium: There is no evidence of pericardial effusion. Mitral Valve: The mitral valve is normal in structure.  Trivial mitral valve regurgitation. No evidence of mitral valve stenosis. Tricuspid Valve: The tricuspid valve is normal in structure. Tricuspid valve regurgitation is not demonstrated. No evidence of tricuspid stenosis. Aortic Valve: The aortic valve is tricuspid. Aortic valve regurgitation is not visualized. No aortic stenosis is present. Pulmonic Valve: The pulmonic valve was not well visualized. Pulmonic valve regurgitation is not visualized. Aorta: The aortic root, ascending aorta, aortic arch and descending aorta are all structurally normal, with no evidence of dilitation or obstruction. Venous: The inferior vena cava is normal in size with less than 50% respiratory variability, suggesting right atrial pressure of 8 mmHg. IAS/Shunts: The atrial septum is grossly normal.  LEFT VENTRICLE PLAX 2D LVIDd:         5.20 cm LVIDs:         3.30 cm LV PW:         1.40 cm LV IVS:        1.30 cm LVOT diam:     2.40 cm LV SV:         113 LV SV Index:   50 LVOT Area:     4.52 cm  LV Volumes (MOD) LV vol d, MOD A2C: 134.0 ml LV vol d, MOD A4C: 149.0 ml LV vol s, MOD A2C: 48.1 ml LV vol s, MOD A4C: 59.2 ml LV SV MOD A2C:     85.9 ml LV SV MOD A4C:     149.0 ml LV SV MOD BP:      91.5 ml RIGHT VENTRICLE             IVC RV S prime:     15.10 cm/s  IVC diam: 1.80 cm TAPSE (M-mode): 1.6 cm LEFT ATRIUM             Index       RIGHT ATRIUM           Index LA diam:        3.60 cm 1.60 cm/m  RA Area:     18.00 cm LA Vol (A2C):   46.7 ml 20.73 ml/m RA Volume:   50.30 ml  22.33 ml/m LA Vol (A4C):   31.0 ml 13.76 ml/m LA Biplane Vol: 39.8 ml 17.67 ml/m  AORTIC VALVE LVOT Vmax:   122.00 cm/s LVOT Vmean:  93.600 cm/s LVOT VTI:    0.249 m  AORTA Ao Root diam: 3.80 cm Ao  Asc diam:  3.40 cm MITRAL VALVE MV Area (PHT): 4.15 cm     SHUNTS MV Decel Time: 183 msec     Systemic VTI:  0.25 m MV E velocity: 141.00 cm/s  Systemic Diam: 2.40 cm MV A velocity: 96.80 cm/s MV E/A ratio:  1.46 Buford Dresser MD Electronically signed by Buford Dresser MD Signature Date/Time: 09/30/2020/2:27:08 PM    Final     Patient Profile     Anthony Chapman is a 70 y.o. male with a history of seasonal allergies, CAD, history of MI, LBBB, osteoarthritis of multiple sites, ED, HTN, GERD, HLD, sleep apnea on CPAP who is being seen today for the evaluation of CHB at the request of Dr. Candiss Norse and Dr. Martinique.  Assessment & Plan    1.  Complete Heart Block Underlying LBBB in setting of CAD No ischemic symptoms. HS trop 40 -> 33. Last dose of coreg 25 mg BID 2/21 am.  He has had syncope and continues to have high grade HB.  Explained risks, benefits, and alternatives to PPM implantation, including but not  limited to bleeding, infection, pneumothorax, pericardial effusion, lead dislodgement, heart attack, stroke, or death.  Pt verbalized understanding and agrees to proceed. Plan on pacing this afternoon.  Clear breakfast. NPO after 0800. Echo 2/22 with EF 60-65%   For questions or updates, please contact [place] Please consult [email or web address] for contact info under Cardiology/STEMI.  Signed, Shirley Friar, PA-C  10/01/2020, 7:17 AM    I have seen, examined the patient, and reviewed the above assessment and plan.  Changes to above are made where necessary.  On exam, bradycardic rhythm.  The patient has symptomatic bradycardia.  His EF is preserved.   I would therefore recommend pacemaker implantation at this time.  Risks, benefits, alternatives to pacemaker implantation were discussed in detail with the patient and his wife  today. The patient and his wife understand that the risks include but are not limited to bleeding, infection, pneumothorax, perforation,  tamponade, vascular damage, renal failure, MI, stroke, death,  and lead dislodgement and wishes to proceed.     Co Sign: [name], MD 10/01/2020 1:59 PM

## 2020-10-01 NOTE — Telephone Encounter (Signed)
Called and left a detailed message for the patient reminding him of his overdue labs ordered by Rosaria Ferries, PA-C. I will try calling patient again.

## 2020-10-01 NOTE — Plan of Care (Signed)

## 2020-10-01 NOTE — Progress Notes (Signed)
RT placed pt on CPAP dreamstation for the night on auto titrate of 12 mx 8 min w/no oxygen bled into the system. Pt respiratory status stable w/no distress noted at this time. RT will continue to monitor.

## 2020-10-01 NOTE — Progress Notes (Signed)
OT Cancellation Note  Patient Details Name: REAL CONA MRN: [id number] DOB: 09/03/1950   Cancelled Treatment:    Reason Eval/Treat Not Completed: Patient at procedure or test/ unavailable (going for pacemaker). Pt continues to be bradycardic scheduled for pacemaker placement this afternoon. OT will continue to follow for eval tomorrow  [facility] 10/01/2020, 7:50 AM   Jesse Sans OTR/L Acute Rehabilitation Services Pager: [phone] Office: [phone]

## 2020-10-01 NOTE — Progress Notes (Signed)
PT Cancellation Note  Patient Details Name: Anthony Chapman MRN: [id number] DOB: [birth year]   Cancelled Treatment:    Reason Eval/Treat Not Completed: (P) Medical issues which prohibited therapy Pt continues to be bradycardic scheduled for pacemaker placement this afternoon. PT will follow back for Evaluation tomorrow.   Elizabeth B. Migdalia Dk PT, DPT Acute Rehabilitation Services Pager [phone] Office [phone]    [place] 10/01/2020, 7:40 AM

## 2020-10-01 NOTE — Progress Notes (Signed)
Progress Note    Anthony Chapman  TLX:[id number] DOB: 1950-11-08  DOA: 09/29/2020 PCP: Cassandria Anger, MD    Brief Narrative:     Medical records reviewed and are as summarized below:  Anthony Chapman is an 70 y.o. male with medical history significant ofseasonal allergies, CAD, history of MI, LBBB, osteoarthritis of multiple sites, ED, hypertension, GERD, hyperlipidemia, sleep apnea on CPAP who is coming from [facility] after presenting there with several days of dizziness, he had no other symptoms, he was found to be profoundly bradycardic, cardiology was consulted and he was admitted to the hospital for further care.  Note he takes extremely high doses of Coreg at home.  Assessment/Plan:   Principal Problem:   Symptomatic bradycardia Active Problems:   Hyperlipidemia LDL goal <70   Essential hypertension   CAD S/P percutaneous coronary angioplasty - 2015 PCI to RCA (2.5 x 14 Resolute DES) & Cx (3 x 12 Resolute DES)   Aortic atherosclerosis (HCC)   Symptomatic bradycardia causing dizziness in a patient who takes 25 mg of Coreg twice daily with stable TSH and history of CAD with multiple PCI's in the past. -Coreg held -planned pacemaker placement 2/23  CAD.  Beta-blocker held for bradycardia, continue aspirin, statin and Imdur for secondary prevention.  Chest pain-free, mild non-ACS pattern borderline troponin rise.  Insignificant.  Dyslipidemia.  On statin.   Hypertension currently on combination of Norvasc  Plus hydralazine for better control as Coreg on hold.  OSA - QHS CPAP.  obesity Body mass index is 36.3 kg/m.   Family Communication/Anticipated D/C date and plan/Code Status   DVT prophylaxis: scd Code Status: Full Code.  Disposition Plan: Status is: Inpatient  Remains inpatient appropriate because:Inpatient level of care appropriate due to severity of illness   Dispo: The patient is from: Home              Anticipated d/c is to:  Home              Anticipated d/c date is: 2 days              Patient currently is not medically stable to d/c.- needs pacemaker   Difficult to place patient No         Medical Consultants:    EP  Subjective:   Ready for procedure  Objective:    Vitals:   09/30/20 2100 09/30/20 2200 10/01/20 0612 10/01/20 0727  BP: (!) 147/71  (!) 161/85 132/73  Pulse: (!) 40 (!) 37 (!) 40 (!) 40  Resp: 19 14 17 16   Temp:   (!) 97.3 F (36.3 C)   TempSrc:   Axillary   SpO2: 92% 94% 97% 93%  Weight:   111.5 kg   Height:        Intake/Output Summary (Last 24 hours) at 10/01/2020 0946 Last data filed at 09/30/2020 1500 Gross per 24 hour  Intake 284.47 ml  Output --  Net 284.47 ml   Filed Weights   09/29/20 1021 09/29/20 2055 10/01/20 0612  Weight: 111.6 kg 111.2 kg 111.5 kg    Exam:  General: Appearance:    Obese male in no acute distress     Lungs:     Clear to auscultation bilaterally, respirations unlabored  Heart:    Bradycardic. Normal rhythm. No murmurs, rubs, or gallops.      Neurologic:   Awake, alert, oriented x 3    Data Reviewed:   I have  personally reviewed following labs and imaging studies:  Labs: Labs show the following:   Basic Metabolic Panel: Recent Labs  Lab 09/29/20 1200 09/30/20 0045 10/01/20 0035  NA 136 139 139  K 3.3* 3.5 3.7  CL 104 108 106  CO2 23 23 25   GLUCOSE 131* 140* 128*  BUN 19 16 16   CREATININE 0.90 0.86 0.93  CALCIUM 8.3* 8.3* 8.4*  MG 1.9  --  2.0  PHOS  --  2.5  --    GFR Estimated Creatinine Clearance: 92.2 mL/min (by C-G formula based on SCr of 0.93 mg/dL). Liver Function Tests: Recent Labs  Lab 09/30/20 0045 10/01/20 0035  AST 26 22  ALT 39 34  ALKPHOS 95 92  BILITOT 0.7 0.9  PROT 6.9 7.0  ALBUMIN 2.8* 2.9*   No results for input(s): LIPASE, AMYLASE in the last 168 hours. No results for input(s): AMMONIA in the last 168 hours. Coagulation profile No results for input(s): INR, PROTIME in the last 168  hours.  CBC: Recent Labs  Lab 09/29/20 1200 09/30/20 0045 10/01/20 0035  WBC 5.3 4.6 4.6  NEUTROABS 3.0  --  2.6  HGB 13.4 13.0 12.9*  HCT 42.4 40.2 40.0  MCV 81.4 80.4 81.8  PLT 149* 150 139*   Cardiac Enzymes: No results for input(s): CKTOTAL, CKMB, CKMBINDEX, TROPONINI in the last 168 hours. BNP (last 3 results) No results for input(s): PROBNP in the last 8760 hours. CBG: Recent Labs  Lab 09/30/20 0559 09/30/20 1104 09/30/20 1656 09/30/20 2142 10/01/20 0646  GLUCAP 93 102* 95 95 101*   D-Dimer: No results for input(s): DDIMER in the last 72 hours. Hgb A1c: Recent Labs    09/30/20 0045  HGBA1C 6.6*   Lipid Profile: No results for input(s): CHOL, HDL, LDLCALC, TRIG, CHOLHDL, LDLDIRECT in the last 72 hours. Thyroid function studies: Recent Labs    09/30/20 0045  TSH 2.619   Anemia work up: No results for input(s): VITAMINB12, FOLATE, FERRITIN, TIBC, IRON, RETICCTPCT in the last 72 hours. Sepsis Labs: Recent Labs  Lab 09/29/20 1200 09/30/20 0045 10/01/20 0035  WBC 5.3 4.6 4.6    Microbiology Recent Results (from the past 240 hour(s))  Resp Panel by RT-PCR (Flu A&B, Covid) Nasopharyngeal Swab     Status: None   Collection Time: 09/29/20 10:32 AM   Specimen: Nasopharyngeal Swab; Nasopharyngeal(NP) swabs in vial transport medium  Result Value Ref Range Status   SARS Coronavirus 2 by RT PCR NEGATIVE NEGATIVE Final    Comment: (NOTE) SARS-CoV-2 target nucleic acids are NOT DETECTED.  The SARS-CoV-2 RNA is generally detectable in upper respiratory specimens during the acute phase of infection. The lowest concentration of SARS-CoV-2 viral copies this assay can detect is 138 copies/mL. A negative result does not preclude SARS-Cov-2 infection and should not be used as the sole basis for treatment or other patient management decisions. A negative result may occur with  improper specimen collection/handling, submission of specimen other than nasopharyngeal  swab, presence of viral mutation(s) within the areas targeted by this assay, and inadequate number of viral copies(<138 copies/mL). A negative result must be combined with clinical observations, patient history, and epidemiological information. The expected result is Negative.  Fact Sheet for Patients:  EntrepreneurPulse.com.au  Fact Sheet for Healthcare Providers:  IncredibleEmployment.be  This test is no t yet approved or cleared by the [place] FDA and  has been authorized for detection and/or diagnosis of SARS-CoV-2 by FDA under an Emergency Use Authorization (EUA). This EUA will  remain  in effect (meaning this test can be used) for the duration of the COVID-19 declaration under Section 564(b)(1) of the Act, 21 U.S.C.section 360bbb-3(b)(1), unless the authorization is terminated  or revoked sooner.       Influenza A by PCR NEGATIVE NEGATIVE Final   Influenza B by PCR NEGATIVE NEGATIVE Final    Comment: (NOTE) The Xpert Xpress SARS-CoV-2/FLU/RSV plus assay is intended as an aid in the diagnosis of influenza from Nasopharyngeal swab specimens and should not be used as a sole basis for treatment. Nasal washings and aspirates are unacceptable for Xpert Xpress SARS-CoV-2/FLU/RSV testing.  Fact Sheet for Patients: EntrepreneurPulse.com.au  Fact Sheet for Healthcare Providers: IncredibleEmployment.be  This test is not yet approved or cleared by the [place] FDA and has been authorized for detection and/or diagnosis of SARS-CoV-2 by FDA under an Emergency Use Authorization (EUA). This EUA will remain in effect (meaning this test can be used) for the duration of the COVID-19 declaration under Section 564(b)(1) of the Act, 21 U.S.C. section 360bbb-3(b)(1), unless the authorization is terminated or revoked.  Performed at [facility], [address] [place], [place] [address]   MRSA  PCR Screening     Status: None   Collection Time: 09/29/20  8:58 PM   Specimen: Nasopharyngeal  Result Value Ref Range Status   MRSA by PCR NEGATIVE NEGATIVE Final    Comment:        The GeneXpert MRSA Assay (FDA approved for NASAL specimens only), is one component of a comprehensive MRSA colonization surveillance program. It is not intended to diagnose MRSA infection nor to guide or monitor treatment for MRSA infections. Performed at [facility], [place] [address], [place], [place] [address]   Surgical PCR screen     Status: None   Collection Time: 09/30/20  5:00 PM   Specimen: Nasal Mucosa; Nasal Swab  Result Value Ref Range Status   MRSA, PCR NEGATIVE NEGATIVE Final   Staphylococcus aureus NEGATIVE NEGATIVE Final    Comment: (NOTE) The Xpert SA Assay (FDA approved for NASAL specimens in patients 69 years of age and older), is one component of a comprehensive surveillance program. It is not intended to diagnose infection nor to guide or monitor treatment. Performed at [facility], [place] [address], [place], [place] [address]     Procedures and diagnostic studies:  DG Chest Portable 1 View  Result Date: 09/29/2020 CLINICAL DATA:  Near syncope, dizziness EXAM: PORTABLE CHEST 1 VIEW COMPARISON:  None. FINDINGS: Normal heart size. Normal mediastinal contour. No pneumothorax. No pleural effusion. Lungs appear clear, with no acute consolidative airspace disease and no pulmonary edema. IMPRESSION: No active disease. Electronically Signed   By: Ilona Sorrel M.D.   On: 09/29/2020 11:21   ECHOCARDIOGRAM COMPLETE  Result Date: 09/30/2020    ECHOCARDIOGRAM REPORT   Patient Name:   Anthony Chapman Date of Exam: 09/30/2020 Medical Rec #:  [id number]       Height:       69.0 in Accession #:    [id number]      Weight:       245.1 lb Date of Birth:  1951/06/13        BSA:          2.253 m Patient Age:    64 years        BP:           162/65 mmHg Patient Gender: M  HR:            38 bpm. Exam Location:  Inpatient Procedure: 2D Echo, 3D Echo, Cardiac Doppler and Color Doppler                          STAT ECHO  Findings communicated with Oda Kilts by phone at 2:26 PM. Indications:    I44.2 Complete heart block  History:        Patient has no prior history of Echocardiogram examinations.                 CAD, Abnormal ECG and Bradycardia, Signs/Symptoms:Syncope; Risk                 Factors:Hypertension and Diabetes.  Sonographer:    Roseanna Rainbow RDCS Referring Phys: [id number] [place]  Sonographer Comments: Technically difficult study due to poor echo windows and suboptimal parasternal window. IMPRESSIONS  1. Left ventricular ejection fraction, by estimation, is 60 to 65%. The left ventricle has normal function. The left ventricle has no regional wall motion abnormalities. There is mild concentric left ventricular hypertrophy. Left ventricular diastolic parameters were normal.  2. Right ventricular systolic function is mildly reduced. The right ventricular size is mildly enlarged.  3. The mitral valve is normal in structure. Trivial mitral valve regurgitation. No evidence of mitral stenosis.  4. The aortic valve is tricuspid. Aortic valve regurgitation is not visualized. No aortic stenosis is present.  5. The inferior vena cava is normal in size with <50% respiratory variability, suggesting right atrial pressure of 8 mmHg. Comparison(s): No prior Echocardiogram. Conclusion(s)/Recommendation(s): Normal biventricular function without evidence of hemodynamically significant valvular heart disease. Bradycardic throughout study. FINDINGS  Left Ventricle: Left ventricular ejection fraction, by estimation, is 60 to 65%. The left ventricle has normal function. The left ventricle has no regional wall motion abnormalities. The left ventricular internal cavity size was normal in size. There is  mild concentric left ventricular hypertrophy. Left ventricular diastolic parameters were  normal. Right Ventricle: The right ventricular size is mildly enlarged. Right vetricular wall thickness was not well visualized. Right ventricular systolic function is mildly reduced. Left Atrium: Left atrial size was normal in size. Right Atrium: Right atrial size was normal in size. Pericardium: There is no evidence of pericardial effusion. Mitral Valve: The mitral valve is normal in structure. Trivial mitral valve regurgitation. No evidence of mitral valve stenosis. Tricuspid Valve: The tricuspid valve is normal in structure. Tricuspid valve regurgitation is not demonstrated. No evidence of tricuspid stenosis. Aortic Valve: The aortic valve is tricuspid. Aortic valve regurgitation is not visualized. No aortic stenosis is present. Pulmonic Valve: The pulmonic valve was not well visualized. Pulmonic valve regurgitation is not visualized. Aorta: The aortic root, ascending aorta, aortic arch and descending aorta are all structurally normal, with no evidence of dilitation or obstruction. Venous: The inferior vena cava is normal in size with less than 50% respiratory variability, suggesting right atrial pressure of 8 mmHg. IAS/Shunts: The atrial septum is grossly normal.  LEFT VENTRICLE PLAX 2D LVIDd:         5.20 cm LVIDs:         3.30 cm LV PW:         1.40 cm LV IVS:        1.30 cm LVOT diam:     2.40 cm LV SV:         113 LV SV Index:   50 LVOT Area:  4.52 cm  LV Volumes (MOD) LV vol d, MOD A2C: 134.0 ml LV vol d, MOD A4C: 149.0 ml LV vol s, MOD A2C: 48.1 ml LV vol s, MOD A4C: 59.2 ml LV SV MOD A2C:     85.9 ml LV SV MOD A4C:     149.0 ml LV SV MOD BP:      91.5 ml RIGHT VENTRICLE             IVC RV S prime:     15.10 cm/s  IVC diam: 1.80 cm TAPSE (M-mode): 1.6 cm LEFT ATRIUM             Index       RIGHT ATRIUM           Index LA diam:        3.60 cm 1.60 cm/m  RA Area:     18.00 cm LA Vol (A2C):   46.7 ml 20.73 ml/m RA Volume:   50.30 ml  22.33 ml/m LA Vol (A4C):   31.0 ml 13.76 ml/m LA Biplane Vol: 39.8  ml 17.67 ml/m  AORTIC VALVE LVOT Vmax:   122.00 cm/s LVOT Vmean:  93.600 cm/s LVOT VTI:    0.249 m  AORTA Ao Root diam: 3.80 cm Ao Asc diam:  3.40 cm MITRAL VALVE MV Area (PHT): 4.15 cm     SHUNTS MV Decel Time: 183 msec     Systemic VTI:  0.25 m MV E velocity: 141.00 cm/s  Systemic Diam: 2.40 cm MV A velocity: 96.80 cm/s MV E/A ratio:  1.46 Buford Dresser MD Electronically signed by Buford Dresser MD Signature Date/Time: 09/30/2020/2:27:08 PM    Final     Medications:   . amLODipine  10 mg Oral Daily  . aspirin  81 mg Oral Daily  . chlorhexidine  60 mL Topical Once  . gentamicin irrigation  80 mg Irrigation On Call  . hydrALAZINE  50 mg Oral Q8H  . isosorbide mononitrate  60 mg Oral Daily  . loratadine  10 mg Oral Daily  . rosuvastatin  40 mg Oral Daily   Continuous Infusions: . sodium chloride 50 mL/hr at 09/30/20 2208  .  ceFAZolin (ANCEF) IV       LOS: 2 days   Geradine Girt  [facility]   How to contact the [facility] Attending or Consulting provider [place] or covering provider during after hours [facility], for this patient?  1. Check the care team in [facility] and look for a) attending/consulting [facility] provider listed and b) the [facility] team listed 2. Log into [email or web address] and use [place]'s universal password to access. If you do not have the password, please contact the hospital operator. 3. Locate the [facility] provider you are looking for under [facility] and page to a number that you can be directly reached. 4. If you still have difficulty reaching the provider, please page the [facility] (Director on Call) for the Hospitalists listed on amion for assistance.  10/01/2020, 9:46 AM

## 2020-10-01 NOTE — Interval H&P Note (Signed)
History and Physical Interval Note:  10/01/2020 2:01 PM  Anthony Chapman  has presented today for surgery, with the diagnosis of complete heart block.  The various methods of treatment have been discussed with the patient and family. After consideration of risks, benefits and other options for treatment, the patient has consented to  Procedure(s): PACEMAKER IMPLANT (N/A) as a surgical intervention.  The patient's history has been reviewed, patient examined, no change in status, stable for surgery.  I have reviewed the patient's chart and labs.  Questions were answered to the patient's satisfaction.     Anthony Chapman

## 2020-10-02 ENCOUNTER — Encounter (HOSPITAL_COMMUNITY): Payer: Self-pay | Admitting: Internal Medicine

## 2020-10-02 ENCOUNTER — Inpatient Hospital Stay (HOSPITAL_COMMUNITY): Payer: No Typology Code available for payment source

## 2020-10-02 LAB — GLUCOSE, CAPILLARY
Glucose-Capillary: 123 mg/dL — ABNORMAL HIGH (ref 70–99)
Glucose-Capillary: 117 mg/dL — ABNORMAL HIGH (ref 70–99)

## 2020-10-02 LAB — MAGNESIUM: Magnesium: 2.1 mg/dL (ref 1.7–2.4)

## 2020-10-02 MED ORDER — CLONIDINE HCL 0.1 MG PO TABS
0.2000 mg | ORAL_TABLET | Freq: Once | ORAL | Status: DC
  Filled 2020-10-02: qty 2

## 2020-10-02 MED ORDER — CARVEDILOL 25 MG PO TABS
25.0000 mg | ORAL_TABLET | Freq: Two times a day (BID) | ORAL | Status: DC
Start: 2020-10-02 — End: 2020-10-02

## 2020-10-02 MED ORDER — HYDRALAZINE HCL 50 MG PO TABS: 50.0000 mg | ORAL_TABLET | Freq: Three times a day (TID) | ORAL | 1 refills | Status: DC

## 2020-10-02 NOTE — Discharge Summary (Signed)
Physician Discharge Summary  Anthony Chapman:[id number] DOB: 1950/08/13 DOA: 09/29/2020  PCP: Cassandria Anger, MD  Admit date: 09/29/2020 Discharge date: 10/02/2020  Admitted From: home Discharge disposition: home   Recommendations for Outpatient Follow-Up:   1. EP follow up s/p pacemaker 2. Titrate BP meds for better control   Discharge Diagnosis:   Principal Problem:   Symptomatic bradycardia Active Problems:   Hyperlipidemia LDL goal <70   Essential hypertension   CAD S/P percutaneous coronary angioplasty - 2015 PCI to RCA (2.5 x 14 Resolute DES) & Cx (3 x 12 Resolute DES)   Aortic atherosclerosis ([place])    Discharge Condition: Improved.  Diet recommendation: Low sodium, heart healthy. Wound care: EP  Code status: Full.   History of Present Illness:   DONYEA Chapman is a 70 y.o. male with medical history significant of seasonal allergies, CAD, history of MI, LBBB, osteoarthritis of multiple sites, ED, hypertension, GERD, hyperlipidemia, sleep apnea on CPAP who is coming from [facility] after presenting there with several days of dizziness.  He denies chest pain, palpitations, diaphoresis, PND, orthopnea or pitting edema of the lower extremities.  He takes carvedilol 25 mg p.o. twice daily.  He denies fever, chills, sore throat, rhinorrhea, dyspnea, wheezing or hemoptysis.  No abdominal pain, diarrhea, constipation, melena or hematochezia.  Denies dysuria, frequency hematuria.  Denies polyuria, polydipsia, polyphagia or blurred vision.   Hospital Course by Problem:   Symptomatic bradycardia causing dizziness in a patient who takes 25 mg of Coreg twice daily with stable TSH and history of CAD with multiple PCI's in the past. -Coreg resumed -s/p pacemaker placement 2/23  CAD. Beta-blocker resumed s/p pacemaker, continue aspirin, statin and Imdur for secondary prevention. Chest pain-free, mild non-ACS pattern borderline troponin rise.  Insignificant.  Dyslipidemia. On statin.  Hypertension currently on combination of Norvasc  Plus hydralazine for better control  -resume coreg  OSA - QHS CPAP.  obesity Body mass index is 36.3 kg/m.    Medical Consultants:   cards   Discharge Exam:   Vitals:   10/02/20 1000 10/02/20 1101  BP: (!) 160/104 (!) 151/93  Pulse: 85 98  Resp: (!) 22 (!) 22  Temp:    SpO2:  92%   Vitals:   10/02/20 0822 10/02/20 0900 10/02/20 1000 10/02/20 1101  BP: (!) 161/90 (!) 159/97 (!) 160/104 (!) 151/93  Pulse: 83 90 85 98  Resp: 18 18 (!) 22 (!) 22  Temp: 97.8 F (36.6 C)     TempSrc: Oral     SpO2:    92%  Weight:      Height:        General exam: Appears calm and comfortable.     The results of significant diagnostics from this hospitalization (including imaging, microbiology, ancillary and laboratory) are listed below for reference.     Procedures and Diagnostic Studies:   DG Chest Portable 1 View  Result Date: 09/29/2020 CLINICAL DATA:  Near syncope, dizziness EXAM: PORTABLE CHEST 1 VIEW COMPARISON:  None. FINDINGS: Normal heart size. Normal mediastinal contour. No pneumothorax. No pleural effusion. Lungs appear clear, with no acute consolidative airspace disease and no pulmonary edema. IMPRESSION: No active disease. Electronically Signed   By: Ilona Sorrel M.D.   On: 09/29/2020 11:21   ECHOCARDIOGRAM COMPLETE  Result Date: 09/30/2020    ECHOCARDIOGRAM REPORT   Patient Name:   Anthony Chapman Date of Exam: 09/30/2020 Medical Rec #:  [id number]  Height:       69.0 in Accession #:    [id number]      Weight:       245.1 lb Date of Birth:  April 23, 1951        BSA:          2.253 m Patient Age:    70 years        BP:           162/65 mmHg Patient Gender: M               HR:           38 bpm. Exam Location:  Inpatient Procedure: 2D Echo, 3D Echo, Cardiac Doppler and Color Doppler                          STAT ECHO  Findings communicated with Oda Kilts by phone at 2:26  PM. Indications:    I44.2 Complete heart block  History:        Patient has no prior history of Echocardiogram examinations.                 CAD, Abnormal ECG and Bradycardia, Signs/Symptoms:Syncope; Risk                 Factors:Hypertension and Diabetes.  Sonographer:    Roseanna Rainbow RDCS Referring Phys: [id number] [place]  Sonographer Comments: Technically difficult study due to poor echo windows and suboptimal parasternal window. IMPRESSIONS  1. Left ventricular ejection fraction, by estimation, is 60 to 65%. The left ventricle has normal function. The left ventricle has no regional wall motion abnormalities. There is mild concentric left ventricular hypertrophy. Left ventricular diastolic parameters were normal.  2. Right ventricular systolic function is mildly reduced. The right ventricular size is mildly enlarged.  3. The mitral valve is normal in structure. Trivial mitral valve regurgitation. No evidence of mitral stenosis.  4. The aortic valve is tricuspid. Aortic valve regurgitation is not visualized. No aortic stenosis is present.  5. The inferior vena cava is normal in size with <50% respiratory variability, suggesting right atrial pressure of 8 mmHg. Comparison(s): No prior Echocardiogram. Conclusion(s)/Recommendation(s): Normal biventricular function without evidence of hemodynamically significant valvular heart disease. Bradycardic throughout study. FINDINGS  Left Ventricle: Left ventricular ejection fraction, by estimation, is 60 to 65%. The left ventricle has normal function. The left ventricle has no regional wall motion abnormalities. The left ventricular internal cavity size was normal in size. There is  mild concentric left ventricular hypertrophy. Left ventricular diastolic parameters were normal. Right Ventricle: The right ventricular size is mildly enlarged. Right vetricular wall thickness was not well visualized. Right ventricular systolic function is mildly reduced. Left Atrium:  Left atrial size was normal in size. Right Atrium: Right atrial size was normal in size. Pericardium: There is no evidence of pericardial effusion. Mitral Valve: The mitral valve is normal in structure. Trivial mitral valve regurgitation. No evidence of mitral valve stenosis. Tricuspid Valve: The tricuspid valve is normal in structure. Tricuspid valve regurgitation is not demonstrated. No evidence of tricuspid stenosis. Aortic Valve: The aortic valve is tricuspid. Aortic valve regurgitation is not visualized. No aortic stenosis is present. Pulmonic Valve: The pulmonic valve was not well visualized. Pulmonic valve regurgitation is not visualized. Aorta: The aortic root, ascending aorta, aortic arch and descending aorta are all structurally normal, with no evidence of dilitation or obstruction. Venous: The inferior vena cava is normal in size with  less than 50% respiratory variability, suggesting right atrial pressure of 8 mmHg. IAS/Shunts: The atrial septum is grossly normal.  LEFT VENTRICLE PLAX 2D LVIDd:         5.20 cm LVIDs:         3.30 cm LV PW:         1.40 cm LV IVS:        1.30 cm LVOT diam:     2.40 cm LV SV:         113 LV SV Index:   50 LVOT Area:     4.52 cm  LV Volumes (MOD) LV vol d, MOD A2C: 134.0 ml LV vol d, MOD A4C: 149.0 ml LV vol s, MOD A2C: 48.1 ml LV vol s, MOD A4C: 59.2 ml LV SV MOD A2C:     85.9 ml LV SV MOD A4C:     149.0 ml LV SV MOD BP:      91.5 ml RIGHT VENTRICLE             IVC RV S prime:     15.10 cm/s  IVC diam: 1.80 cm TAPSE (M-mode): 1.6 cm LEFT ATRIUM             Index       RIGHT ATRIUM           Index LA diam:        3.60 cm 1.60 cm/m  RA Area:     18.00 cm LA Vol (A2C):   46.7 ml 20.73 ml/m RA Volume:   50.30 ml  22.33 ml/m LA Vol (A4C):   31.0 ml 13.76 ml/m LA Biplane Vol: 39.8 ml 17.67 ml/m  AORTIC VALVE LVOT Vmax:   122.00 cm/s LVOT Vmean:  93.600 cm/s LVOT VTI:    0.249 m  AORTA Ao Root diam: 3.80 cm Ao Asc diam:  3.40 cm MITRAL VALVE MV Area (PHT): 4.15 cm      SHUNTS MV Decel Time: 183 msec     Systemic VTI:  0.25 m MV E velocity: 141.00 cm/s  Systemic Diam: 2.40 cm MV A velocity: 96.80 cm/s MV E/A ratio:  1.46 Buford Dresser MD Electronically signed by Buford Dresser MD Signature Date/Time: 09/30/2020/2:27:08 PM    Final      Labs:   Basic Metabolic Panel: Recent Labs  Lab 09/29/20 1200 09/30/20 0045 10/01/20 0035 10/02/20 0115  NA 136 139 139  --   K 3.3* 3.5 3.7  --   CL 104 108 106  --   CO2 23 23 25   --   GLUCOSE 131* 140* 128*  --   BUN 19 16 16   --   CREATININE 0.90 0.86 0.93  --   CALCIUM 8.3* 8.3* 8.4*  --   MG 1.9  --  2.0 2.1  PHOS  --  2.5  --   --    GFR Estimated Creatinine Clearance: 92.2 mL/min (by C-G formula based on SCr of 0.93 mg/dL). Liver Function Tests: Recent Labs  Lab 09/30/20 0045 10/01/20 0035  AST 26 22  ALT 39 34  ALKPHOS 95 92  BILITOT 0.7 0.9  PROT 6.9 7.0  ALBUMIN 2.8* 2.9*   No results for input(s): LIPASE, AMYLASE in the last 168 hours. No results for input(s): AMMONIA in the last 168 hours. Coagulation profile No results for input(s): INR, PROTIME in the last 168 hours.  CBC: Recent Labs  Lab 09/29/20 1200 09/30/20 0045 10/01/20 0035  WBC 5.3 4.6 4.6  NEUTROABS  3.0  --  2.6  HGB 13.4 13.0 12.9*  HCT 42.4 40.2 40.0  MCV 81.4 80.4 81.8  PLT 149* 150 139*   Cardiac Enzymes: No results for input(s): CKTOTAL, CKMB, CKMBINDEX, TROPONINI in the last 168 hours. BNP: Invalid input(s): POCBNP CBG: Recent Labs  Lab 10/01/20 0646 10/01/20 1113 10/01/20 2133 10/02/20 0542 10/02/20 1058  GLUCAP 101* 100* 143* 123* 117*   D-Dimer No results for input(s): DDIMER in the last 72 hours. Hgb A1c Recent Labs    09/30/20 0045  HGBA1C 6.6*   Lipid Profile No results for input(s): CHOL, HDL, LDLCALC, TRIG, CHOLHDL, LDLDIRECT in the last 72 hours. Thyroid function studies Recent Labs    09/30/20 0045  TSH 2.619   Anemia work up No results for input(s): VITAMINB12,  FOLATE, FERRITIN, TIBC, IRON, RETICCTPCT in the last 72 hours. Microbiology Recent Results (from the past 240 hour(s))  Resp Panel by RT-PCR (Flu A&B, Covid) Nasopharyngeal Swab     Status: None   Collection Time: 09/29/20 10:32 AM   Specimen: Nasopharyngeal Swab; Nasopharyngeal(NP) swabs in vial transport medium  Result Value Ref Range Status   SARS Coronavirus 2 by RT PCR NEGATIVE NEGATIVE Final    Comment: (NOTE) SARS-CoV-2 target nucleic acids are NOT DETECTED.  The SARS-CoV-2 RNA is generally detectable in upper respiratory specimens during the acute phase of infection. The lowest concentration of SARS-CoV-2 viral copies this assay can detect is 138 copies/mL. A negative result does not preclude SARS-Cov-2 infection and should not be used as the sole basis for treatment or other patient management decisions. A negative result may occur with  improper specimen collection/handling, submission of specimen other than nasopharyngeal swab, presence of viral mutation(s) within the areas targeted by this assay, and inadequate number of viral copies(<138 copies/mL). A negative result must be combined with clinical observations, patient history, and epidemiological information. The expected result is Negative.  Fact Sheet for Patients:  EntrepreneurPulse.com.au  Fact Sheet for Healthcare Providers:  IncredibleEmployment.be  This test is no t yet approved or cleared by the [place] FDA and  has been authorized for detection and/or diagnosis of SARS-CoV-2 by FDA under an Emergency Use Authorization (EUA). This EUA will remain  in effect (meaning this test can be used) for the duration of the COVID-19 declaration under Section 564(b)(1) of the Act, 21 U.S.C.section 360bbb-3(b)(1), unless the authorization is terminated  or revoked sooner.       Influenza A by PCR NEGATIVE NEGATIVE Final   Influenza B by PCR NEGATIVE NEGATIVE Final    Comment:  (NOTE) The Xpert Xpress SARS-CoV-2/FLU/RSV plus assay is intended as an aid in the diagnosis of influenza from Nasopharyngeal swab specimens and should not be used as a sole basis for treatment. Nasal washings and aspirates are unacceptable for Xpert Xpress SARS-CoV-2/FLU/RSV testing.  Fact Sheet for Patients: EntrepreneurPulse.com.au  Fact Sheet for Healthcare Providers: IncredibleEmployment.be  This test is not yet approved or cleared by the [place] FDA and has been authorized for detection and/or diagnosis of SARS-CoV-2 by FDA under an Emergency Use Authorization (EUA). This EUA will remain in effect (meaning this test can be used) for the duration of the COVID-19 declaration under Section 564(b)(1) of the Act, 21 U.S.C. section 360bbb-3(b)(1), unless the authorization is terminated or revoked.  Performed at [facility], [address] [place], [place] [address]   MRSA PCR Screening     Status: None   Collection Time: 09/29/20  8:58 PM  Specimen: Nasopharyngeal  Result Value Ref Range Status   MRSA by PCR NEGATIVE NEGATIVE Final    Comment:        The GeneXpert MRSA Assay (FDA approved for NASAL specimens only), is one component of a comprehensive MRSA colonization surveillance program. It is not intended to diagnose MRSA infection nor to guide or monitor treatment for MRSA infections. Performed at [facility], [place] [address] [place], [place] [address]   Surgical PCR screen     Status: None   Collection Time: 09/30/20  5:00 PM   Specimen: Nasal Mucosa; Nasal Swab  Result Value Ref Range Status   MRSA, PCR NEGATIVE NEGATIVE Final   Staphylococcus aureus NEGATIVE NEGATIVE Final    Comment: (NOTE) The Xpert SA Assay (FDA approved for NASAL specimens in patients 14 years of age and older), is one component of a comprehensive surveillance program. It is not intended to diagnose infection nor to guide or  monitor treatment. Performed at [facility], [place] [address], [place], [place] [address]      Discharge Instructions:   Discharge Instructions    Diet - low sodium heart healthy   Complete by: As directed    Increase activity slowly   Complete by: As directed      Allergies as of 10/02/2020      Reactions   Losartan Other (See Comments)   angioedema      Medication List    STOP taking these medications   potassium chloride 8 MEQ tablet Commonly known as: KLOR-CON     TAKE these medications   amLODipine 10 MG tablet Commonly known as: NORVASC TAKE 1 TABLET(10 MG) BY MOUTH DAILY What changed: See the new instructions.   aspirin 81 MG EC tablet Take 1 tablet (81 mg total) by mouth daily.   carvedilol 25 MG tablet Commonly known as: COREG TAKE 1 TABLET(25 MG) BY MOUTH TWICE DAILY WITH A MEAL What changed: See the new instructions.   cetirizine 10 MG tablet Commonly known as: ZYRTEC Take 10 mg by mouth daily as needed for allergies.   hydrALAZINE 50 MG tablet Commonly known as: APRESOLINE Take 1 tablet (50 mg total) by mouth every 8 (eight) hours.   isosorbide mononitrate 60 MG 24 hr tablet Commonly known as: IMDUR Take 1 tablet (60 mg total) by mouth daily.   montelukast 10 MG tablet Commonly known as: SINGULAIR Take 10 mg by mouth daily as needed (allergies).   nitroGLYCERIN 0.4 MG SL tablet Commonly known as: NITROSTAT DISSOLVE 1 TABLET UNDER THE TONGUE EVERY 5 MINUTES AS  NEEDED FOR CHEST PAIN. MAX  OF 3 TABLETS IN 15 MINUTES. CALL 911 IF PAIN PERSISTS. What changed:   how to take this  when to take this  additional instructions   rosuvastatin 40 MG tablet Commonly known as: CRESTOR Take 40 mg by mouth daily.   VITAMIN D (CHOLECALCIFEROL) PO Take 1 tablet by mouth daily as needed (vitamin d supplement).       Follow-up Information    [facility] Follow up.   Why: on 3/8 at 4 pm for  post pacemaker wound check Contact information: [place] [address] [phone]       Plotnikov, Evie Lacks, MD Follow up in 1 week(s).   Specialty: Internal Medicine Contact information: [place] [address] [phone]        [place], Peter M, MD .  Specialty: Cardiology Contact information: [address] [place] [address] [phone]        [name], MD .   Specialty: Cardiology Contact information: [place] [address] [phone]                Time coordinating discharge: 35 min  Signed:  Geradine Girt DO  [facility] 10/02/2020, 5:00 PM

## 2020-10-02 NOTE — Progress Notes (Signed)
Called by RN. BP 170-180/90-98. Given hydralazine earlier with no change in BP.  Had pacemaker placed yesterday. Pulse 70-80 now.  Given dose of clonidine 0.2 mg po once. Monitor BP

## 2020-10-02 NOTE — Discharge Instructions (Signed)
After Your Pacemaker   . You have a [employer] Pacemaker  ACTIVITY . Do not lift your arm above shoulder height for 1 week after your procedure. After 7 days, you may progress as below.  . You should remove your sling 24 hours after your procedure, unless otherwise instructed by your provider.     Thursday October 09, 2020  Friday October 10, 2020 Saturday October 11, 2020 Sunday October 12, 2020   . Do not lift, push, pull, or carry anything over 10 pounds with the affected arm until 6 weeks (Thursday November 13, 2020 ) after your procedure.   . Do NOT DRIVE until you have been seen for your wound check, or as long as instructed by your healthcare provider.   . Ask your healthcare provider when you can go back to work   INCISION/Dressing . If you are on a blood thinner such as Coumadin, Xarelto, Eliquis, Plavix, or Pradaxa please confirm with your provider when this should be resumed.   . If large square, outer bandage is left in place, this can be removed after 24 hours from your procedure. Do not remove steri-strips or glue as below.   . Monitor your Pacemaker site for redness, swelling, and drainage. Call the device clinic at [phone] if you experience these symptoms or fever/chills.  . If your incision is sealed with Steri-strips or staples, you may shower 10 days after your procedure or when told by your provider. Do not remove the steri-strips or let the shower hit directly on your site. You may wash around your site with soap and water.    Marland Kitchen Avoid lotions, ointments, or perfumes over your incision until it is well-healed.  . You may use a hot tub or a pool AFTER your wound check appointment if the incision is completely closed.  Marland Kitchen PAcemaker Alerts:  Some alerts are vibratory and others beep. These are NOT emergencies. Please call our office to let us know. If this occurs at night or on weekends, it can wait until the next business day. Send a remote transmission.  . If your device is  capable of reading fluid status (for heart failure), you will be offered monthly monitoring to review this with you.   DEVICE MANAGEMENT . Remote monitoring is used to monitor your pacemaker from home. This monitoring is scheduled every 91 days by our office. It allows [place] to keep an eye on the functioning of your device to ensure it is working properly. You will routinely see your Electrophysiologist annually (more often if necessary).   . You should receive your ID card for your new device in 4-8 weeks. Keep this card with you at all times once received. Consider wearing a medical alert bracelet or necklace.  . Your Pacemaker may be MRI compatible. This will be discussed at your next office visit/wound check.  You should avoid contact with strong electric or magnetic fields.    Do not use amateur (ham) radio equipment or electric (arc) welding torches. MP3 player headphones with magnets should not be used. Some devices are safe to use if held at least 12 inches (30 cm) from your Pacemaker. These include power tools, lawn mowers, and speakers. If you are unsure if something is safe to use, ask your health care provider.   When using your cell phone, hold it to the ear that is on the opposite side from the Pacemaker. Do not leave your cell phone in a pocket over the Pacemaker.  You may safely use electric blankets, heating pads, computers, and microwave ovens.  Call the office right away if:  You have chest pain.  You feel more short of breath than you have felt before.  You feel more light-headed than you have felt before.  Your incision starts to open up.  This information is not intended to replace advice given to you by your health care provider. Make sure you discuss any questions you have with your health care provider.

## 2020-10-02 NOTE — Progress Notes (Addendum)
Electrophysiology Rounding Note  Patient Name: Anthony Chapman Date of Encounter: 10/02/2020  Primary Cardiologist: Peter Martinique, MD Electrophysiologist: Dr. Rayann Heman   Subjective   "feels much better having a heart rate of 80" At this time, the patient denies chest pain, shortness of breath, or any new concerns.  S/p St Jude dual chamber pacer 10/01/20 CXR with stable lead placement and no pneumothorax  Inpatient Medications    Scheduled Meds:  amLODipine  10 mg Oral Daily   aspirin  81 mg Oral Daily   cloNIDine  0.2 mg Oral Once   hydrALAZINE  50 mg Oral Q8H   isosorbide mononitrate  60 mg Oral Daily   loratadine  10 mg Oral Daily   rosuvastatin  40 mg Oral Daily   sodium chloride flush  3 mL Intravenous Q12H   Continuous Infusions:  sodium chloride      ceFAZolin (ANCEF) IV Stopped (10/02/20 0214)   PRN Meds: sodium chloride, acetaminophen, HYDROcodone-acetaminophen, montelukast, nitroGLYCERIN, ondansetron (ZOFRAN) IV, sodium chloride flush   Vital Signs    Vitals:   10/01/20 2330 10/01/20 2348 10/02/20 0328 10/02/20 0637  BP: (!) 177/95  (!) 151/82 (!) 169/99  Pulse: 80 90 74   Resp: 17 18 20    Temp: 97.7 F (36.5 C)  97.8 F (36.6 C)   TempSrc: Oral  Oral   SpO2: 94% 95% 93%   Weight:      Height:        Intake/Output Summary (Last 24 hours) at 10/02/2020 0803 Last data filed at 10/02/2020 0400 Gross per 24 hour  Intake 1224.98 ml  Output 1800 ml  Net -575.02 ml   Filed Weights   09/29/20 1021 09/29/20 2055 10/01/20 0612  Weight: 111.6 kg 111.2 kg 111.5 kg    Physical Exam    GEN- The patient is well appearing, alert and oriented x 3 today.   Head- normocephalic, atraumatic Eyes-  Sclera clear, conjunctiva pink Ears- hearing intact Oropharynx- clear Neck- supple Lungs- Clear to ausculation bilaterally, normal work of breathing Heart- Regular rate and rhythm, no murmurs, rubs or gallops GI- soft, NT, ND, + BS Extremities- no  clubbing or cyanosis. No edema Skin- no rash or lesion Psych- euthymic mood, full affect Neuro- strength and sensation are intact  Labs    CBC Recent Labs    09/29/20 1200 09/30/20 0045 10/01/20 0035  WBC 5.3 4.6 4.6  NEUTROABS 3.0  --  2.6  HGB 13.4 13.0 12.9*  HCT 42.4 40.2 40.0  MCV 81.4 80.4 81.8  PLT 149* 150 768*   Basic Metabolic Panel Recent Labs    09/30/20 0045 10/01/20 0035 10/02/20 0115  NA 139 139  --   K 3.5 3.7  --   CL 108 106  --   CO2 23 25  --   GLUCOSE 140* 128*  --   BUN 16 16  --   CREATININE 0.86 0.93  --   CALCIUM 8.3* 8.4*  --   MG  --  2.0 2.1  PHOS 2.5  --   --    Liver Function Tests Recent Labs    09/30/20 0045 10/01/20 0035  AST 26 22  ALT 39 34  ALKPHOS 95 92  BILITOT 0.7 0.9  PROT 6.9 7.0  ALBUMIN 2.8* 2.9*   No results for input(s): LIPASE, AMYLASE in the last 72 hours. Cardiac Enzymes No results for input(s): CKTOTAL, CKMB, CKMBINDEX, TROPONINI in the last 72 hours.   Telemetry  NSR 70-80s (personally reviewed)  Radiology    DG Chest 2 View  Result Date: 10/02/2020 CLINICAL DATA:  Cardiac device in situ. EXAM: CHEST - 2 VIEW COMPARISON:  09/29/2020. FINDINGS: Mediastinum appears stable. Cardiac pacer noted with lead tips over the right atrium right ventricle. Stable mild cardiomegaly. No pulmonary venous congestion. Stable tortuosity of the thoracic aorta noted. Low lung volumes with mild bibasilar atelectasis. Tiny left pleural effusion cannot be excluded. No pneumothorax. IMPRESSION: 1. Cardiac pacer noted with lead tips over the right atrium and right ventricle. Stable mild cardiomegaly. No pulmonary venous congestion. 2. Low lung volumes with mild bibasilar atelectasis. Electronically Signed   By: Marcello Moores  Register   On: 10/02/2020 06:25   EP PPM/ICD IMPLANT  Result Date: 10/01/2020 SURGEON:  [name], MD   PREPROCEDURE DIAGNOSIS:  Symptomatic complete heart block   POSTPROCEDURE DIAGNOSIS:  Symptomatic  complete heart block    PROCEDURES:  1. Left upper extremity venography.  2. Pacemaker implantation.   INTRODUCTION:  Anthony Chapman is a 70 y.o. male with a history of symptomatic bradycardia who presents today for pacemaker implantation.  The patient reports intermittent episodes of dizziness over the past few months.  No reversible causes have been identified.  The patient therefore presents today for pacemaker implantation.   DESCRIPTION OF PROCEDURE:  Informed written consent was obtained, and  the patient was brought to the electrophysiology lab in a fasting state.   The patients left chest was prepped and draped in the usual sterile fashion by the EP lab staff. The skin overlying the left deltopectoral region was infiltrated with lidocaine for local analgesia.  A 4-cm incision was made over the left deltopectoral region.  A left subcutaneous pacemaker pocket was fashioned using a combination of sharp and blunt dissection. Electrocautery was required to assure hemostasis.  Left Upper Extremity Venography: A venogram of the left upper extremity was performed, which revealed a large left axillary vein, which emptied into a large left subclavian vein.  The left cephalic vein was small in size.  RA/RV Lead Placement: The left axillary vein was therefore cannulated. Through the left axillary vein, a St Jude Medical Tendril MRI model [id number]- 52 (serial number  [id number]) right atrial lead and a St Jude Medical Tendril MRI model [id number]  (serial number  [id number]) right ventricular lead were advanced with fluoroscopic visualization into the right atrial appendage and right ventricular apex positions respectively.  Initial atrial lead P- waves measured 2.6 mV with impedance of 576 ohms and a threshold of 1.2 V at 0.5 msec.  Right ventricular lead R-waves measured 10 mV with an impedance of 800 ohms and a threshold of 0.5 V at 0.5 msec.  Both leads were secured to the pectoralis fascia using #2-0 silk over the  suture sleeves. Device Placement:  The leads were then connected to a [place] MRI model [id number] (serial number  [id number]) pacemaker.  The pocket was irrigated with copious gentamicin solution.  The pacemaker was then placed into the pocket.  The pocket was then closed in 2 layers with 2.0 Vicryl suture for the subcutaneous and subcuticular layers.  Steri-  Strips and a sterile dressing were then applied. EBL<53ml.  There were no early apparent complications. Permanent Pacemaker Indication: Documented non-reversible symptomatic bradycardia due to second degree and/or third degree atrioventricular block.    CONCLUSIONS:  1. Successful implantation of a St Jude Medical Assurity MRI conditional  dual-chamber pacemaker for symptomatic complete heart block  2. No  early apparent complications.       [name], MD 10/01/2020 5:24 PM   ECHOCARDIOGRAM COMPLETE  Result Date: 09/30/2020    ECHOCARDIOGRAM REPORT   Patient Name:   Anthony Chapman Date of Exam: 09/30/2020 Medical Rec #:  [id number]       Height:       69.0 in Accession #:    [id number]      Weight:       245.1 lb Date of Birth:  30-Oct-1950        BSA:          2.253 m Patient Age:    70 years        BP:           162/65 mmHg Patient Gender: M               HR:           38 bpm. Exam Location:  Inpatient Procedure: 2D Echo, 3D Echo, Cardiac Doppler and Color Doppler                          STAT ECHO  Findings communicated with Oda Kilts by phone at 2:26 PM. Indications:    I44.2 Complete heart block  History:        Patient has no prior history of Echocardiogram examinations.                 CAD, Abnormal ECG and Bradycardia, Signs/Symptoms:Syncope; Risk                 Factors:Hypertension and Diabetes.  Sonographer:    Roseanna Rainbow RDCS Referring Phys: [id number] L[place]  Sonographer Comments: Technically difficult study due to poor echo windows and suboptimal parasternal window. IMPRESSIONS  1. Left ventricular ejection fraction,  by estimation, is 60 to 65%. The left ventricle has normal function. The left ventricle has no regional wall motion abnormalities. There is mild concentric left ventricular hypertrophy. Left ventricular diastolic parameters were normal.  2. Right ventricular systolic function is mildly reduced. The right ventricular size is mildly enlarged.  3. The mitral valve is normal in structure. Trivial mitral valve regurgitation. No evidence of mitral stenosis.  4. The aortic valve is tricuspid. Aortic valve regurgitation is not visualized. No aortic stenosis is present.  5. The inferior vena cava is normal in size with <50% respiratory variability, suggesting right atrial pressure of 8 mmHg. Comparison(s): No prior Echocardiogram. Conclusion(s)/Recommendation(s): Normal biventricular function without evidence of hemodynamically significant valvular heart disease. Bradycardic throughout study. FINDINGS  Left Ventricle: Left ventricular ejection fraction, by estimation, is 60 to 65%. The left ventricle has normal function. The left ventricle has no regional wall motion abnormalities. The left ventricular internal cavity size was normal in size. There is  mild concentric left ventricular hypertrophy. Left ventricular diastolic parameters were normal. Right Ventricle: The right ventricular size is mildly enlarged. Right vetricular wall thickness was not well visualized. Right ventricular systolic function is mildly reduced. Left Atrium: Left atrial size was normal in size. Right Atrium: Right atrial size was normal in size. Pericardium: There is no evidence of pericardial effusion. Mitral Valve: The mitral valve is normal in structure. Trivial mitral valve regurgitation. No evidence of mitral valve stenosis. Tricuspid Valve: The tricuspid valve is normal in structure. Tricuspid valve regurgitation is not demonstrated. No evidence of tricuspid stenosis. Aortic Valve: The aortic valve is tricuspid. Aortic valve regurgitation is  not visualized.  No aortic stenosis is present. Pulmonic Valve: The pulmonic valve was not well visualized. Pulmonic valve regurgitation is not visualized. Aorta: The aortic root, ascending aorta, aortic arch and descending aorta are all structurally normal, with no evidence of dilitation or obstruction. Venous: The inferior vena cava is normal in size with less than 50% respiratory variability, suggesting right atrial pressure of 8 mmHg. IAS/Shunts: The atrial septum is grossly normal.  LEFT VENTRICLE PLAX 2D LVIDd:         5.20 cm LVIDs:         3.30 cm LV PW:         1.40 cm LV IVS:        1.30 cm LVOT diam:     2.40 cm LV SV:         113 LV SV Index:   50 LVOT Area:     4.52 cm  LV Volumes (MOD) LV vol d, MOD A2C: 134.0 ml LV vol d, MOD A4C: 149.0 ml LV vol s, MOD A2C: 48.1 ml LV vol s, MOD A4C: 59.2 ml LV SV MOD A2C:     85.9 ml LV SV MOD A4C:     149.0 ml LV SV MOD BP:      91.5 ml RIGHT VENTRICLE             IVC RV S prime:     15.10 cm/s  IVC diam: 1.80 cm TAPSE (M-mode): 1.6 cm LEFT ATRIUM             Index       RIGHT ATRIUM           Index LA diam:        3.60 cm 1.60 cm/m  RA Area:     18.00 cm LA Vol (A2C):   46.7 ml 20.73 ml/m RA Volume:   50.30 ml  22.33 ml/m LA Vol (A4C):   31.0 ml 13.76 ml/m LA Biplane Vol: 39.8 ml 17.67 ml/m  AORTIC VALVE LVOT Vmax:   122.00 cm/s LVOT Vmean:  93.600 cm/s LVOT VTI:    0.249 m  AORTA Ao Root diam: 3.80 cm Ao Asc diam:  3.40 cm MITRAL VALVE MV Area (PHT): 4.15 cm     SHUNTS MV Decel Time: 183 msec     Systemic VTI:  0.25 m MV E velocity: 141.00 cm/s  Systemic Diam: 2.40 cm MV A velocity: 96.80 cm/s MV E/A ratio:  1.46 Buford Dresser MD Electronically signed by Buford Dresser MD Signature Date/Time: 09/30/2020/2:27:08 PM    Final     Patient Profile     Anthony Mccravy Lindsayis a 70 y.o.malewith a history of seasonal allergies, CAD, history of MI, LBBB, osteoarthritis of multiple sites, ED, HTN, GERD, HLD, sleep apnea on CPAPwho is being seen  today for the evaluation of CHBat the request of Dr. Candiss Norse and Dr. Martinique.  Assessment & Plan    1. Complete Heart Block s/p St. Jude dual chamber pacer 2/23/22b CXR and device interrogation personally reviewed and stable this am. Usual follow up in place. Sees device clinic 3/8, Dr. Rayann Heman in 3 months. Personally reviewed wound care and arm restrictions with patient.  Site stable with outer dressings removed. Leave Steri strips in place.   2. HTN He can resume his PTA beta blocker now s/p pacing.   EP to see as needed while remains here.   For questions or updates, please contact [place] Please consult [email or web address] for contact info under Cardiology/STEMI.  Signed,  Shirley Friar, PA-C  10/02/2020, 8:03 AM    Device interrogation reviewed and normal.  CXR reveals stable leads and no ptx.  Co Sign: [name], MD 10/02/2020 6:02 PM

## 2020-10-02 NOTE — Evaluation (Signed)
Occupational Therapy Evaluation Patient Details Name: Anthony Chapman MRN: [id number] DOB: 1951/01/23 Today's Date: 10/02/2020    History of Present Illness 70 y.o. male with medical history significant of seasonal allergies, CAD, history of MI, LBBB, osteoarthritis of multiple sites, ED, hypertension, GERD, hyperlipidemia, sleep apnea on CPAP who is coming from [facility] after presenting there with several days of dizziness. Admitted 09/29/20 for treatment of bradycardia. 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 Drives. Very independent. Pacemaker handout provided and reviewed in full including ROM limitations and daily progression with compensatory strategies for ADL. Today Pt is mod I for all aspects of ADL including hallway ambulation. HR 98-117. Education complete. Pt verbalized understanding and OT will sign off at this time.     Follow Up Recommendations  No OT follow up    Equipment Recommendations  None recommended by OT    Recommendations for Other Services       Precautions / Restrictions Precautions Precautions: ICD/Pacemaker Precaution Comments: handout provided and reviewed in full Required Braces or Orthoses: Sling (for comfort) Restrictions Weight Bearing Restrictions: Yes LUE Weight Bearing: Non weight bearing Other Position/Activity Restrictions: sling for now, educated in progression of movement and handout provided      Mobility Bed Mobility Overal bed mobility: Modified Independent             General bed mobility comments: no attempt to push/pull through LUE    Transfers Overall transfer level: Modified independent Equipment used: None (sling for comfort)                  Balance Overall balance assessment: No apparent balance deficits (not formally assessed)                                          ADL either performed or assessed with clinical judgement   ADL Overall ADL's : Modified independent                                       General ADL Comments: educated in compensatory strategies with pacemaker precautions, Pt able to don sling independently, perform transfers (NWB LUE) and peri care, sink level grooming. Will have wife at home should he need additional assist     Vision Baseline Vision/History: Wears glasses Wears Glasses: Reading only Patient Visual Report: No change from baseline       Perception     Praxis      Pertinent Vitals/Pain Pain Assessment: 0-10 Pain Score: 4  Pain Location: incision site Pain Descriptors / Indicators: Discomfort Pain Intervention(s): Limited activity within patient's tolerance;Monitored during session     Hand Dominance Right   Extremity/Trunk Assessment Upper Extremity Assessment Upper Extremity Assessment: LUE deficits/detail LUE Deficits / Details: limited by pacemaker precautions, use of sling for comfort   Lower Extremity Assessment Lower Extremity Assessment: Overall WFL for tasks assessed       Communication Communication Communication: No difficulties   Cognition Arousal/Alertness: Awake/alert Behavior During Therapy: WFL for tasks assessed/performed Overall Cognitive Status: Within Functional Limits for tasks assessed  General Comments       Exercises     Shoulder Instructions      Home Living Family/patient expects to be discharged to:: Private residence Living Arrangements: Spouse/significant other Available Help at Discharge: Family;Available 24 hours/day Type of Home: House Home Access: Stairs to enter [employer] of Steps: 1 Entrance Stairs-Rails: None Home Layout: One level     Bathroom Shower/Tub: Corporate investment banker: Handicapped height Bathroom  Accessibility: Yes How Accessible: Accessible via walker Home Equipment: None          Prior Functioning/Environment Level of Independence: Independent        Comments: works PRN as a Presenter, broadcasting at [employer] List: Decreased activity tolerance;Pain      OT Treatment/Interventions:      OT Goals(Current goals can be found in the care plan section) Acute Rehab OT Goals Patient Stated Goal: to get back to 100% OT Goal Formulation: With patient Time For Goal Achievement: 10/16/20 Potential to Achieve Goals: Good  OT Frequency:     Barriers to D/C:            Co-evaluation              AM-PAC OT "6 Clicks" Daily Activity     Outcome Measure Help from another person eating meals?: None Help from another person taking care of personal grooming?: None Help from another person toileting, which includes using toliet, bedpan, or urinal?: None Help from another person bathing (including washing, rinsing, drying)?: None Help from another person to put on and taking off regular upper body clothing?: None Help from another person to put on and taking off regular lower body clothing?: None 6 Click Score: 24   End of Session Equipment Utilized During Treatment: Other (comment) (sling for comfort) Nurse Communication: Mobility status  Activity Tolerance: Patient tolerated treatment well Patient left: in chair;with nursing/sitter in room  OT Visit Diagnosis: Pain;Muscle weakness (generalized) (M62.81) Pain - Right/Left: Left Pain - part of body: Shoulder (pacemaker incision site)                Time: 6283-1517 OT Time Calculation (min): 26 min Charges:  OT General Charges $OT Visit: 1 Visit OT Evaluation $OT Eval Moderate Complexity: 1 Mod OT Treatments $Self Care/Home Management : 8-22 mins  Jesse Sans OTR/L Acute Rehabilitation Services Pager: [phone] Office: [place] 10/02/2020, 1:04 PM

## 2020-10-02 NOTE — Progress Notes (Signed)
PT Cancellation Note  Patient Details Name: Anthony Chapman MRN: [id number] DOB: [birth year]   Cancelled Treatment:    Reason Eval/Treat Not Completed: (P) PT screened, no needs identified, will sign off Seen by OT and mod I for mobility including hallway ambulation. Pt to d/c home today.   Shamona Wirtz B. Migdalia Dk PT, DPT Acute Rehabilitation Services Pager [phone] Office [phone]    [place] 10/02/2020, 1:40 PM

## 2020-10-04 IMAGING — CT CT RENAL STONE PROTOCOL
2 of 4 series · 15 of 46 positions shown, 17 images · non-contrast
Comparison: None.

CLINICAL DATA: Right flank region pain

EXAM:
CT ABDOMEN AND PELVIS WITHOUT CONTRAST
TECHNIQUE: Multidetector CT imaging of the abdomen and pelvis was performed
following the standard protocol without oral or IV contrast.

[Series 2: stone study 5.0 i30f 1 · axial · 0.87mm/px · z∈[-526,-136]mm · 12 of 90 slices shown, 14 images]
[im 8/90  soft-tissue]
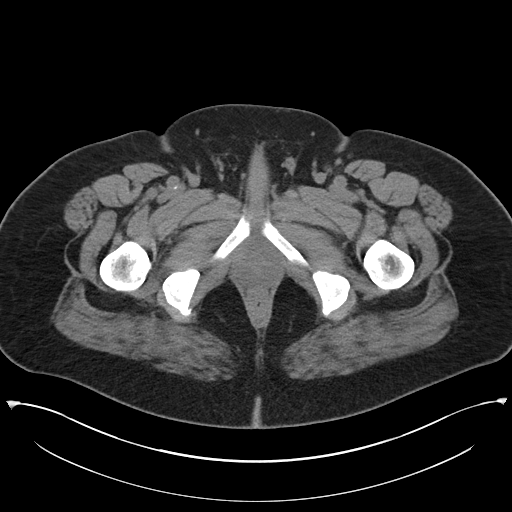
[im 8/90  bone]
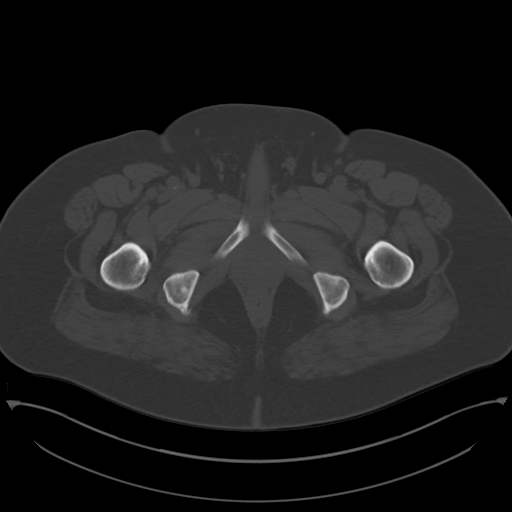
[im 15/90  soft-tissue]
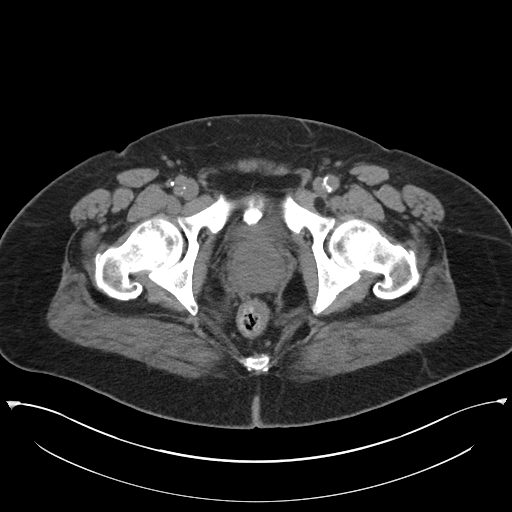
[im 22/90  soft-tissue]
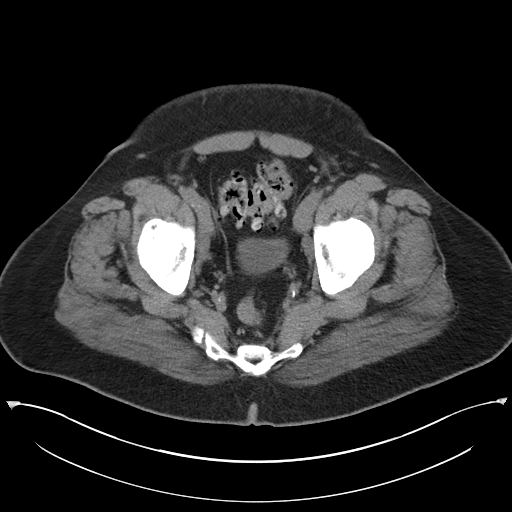
[im 29/90  soft-tissue]
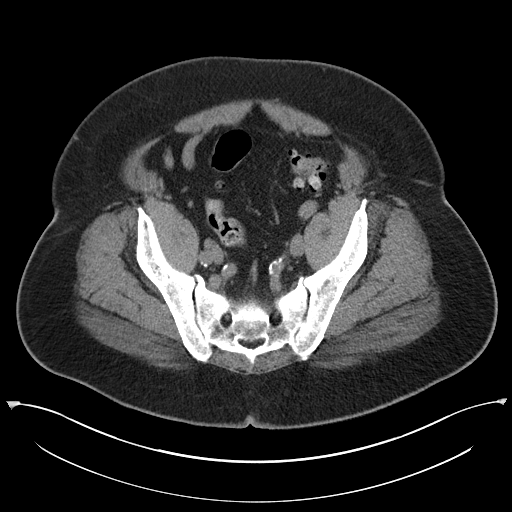
[im 36/90  soft-tissue]
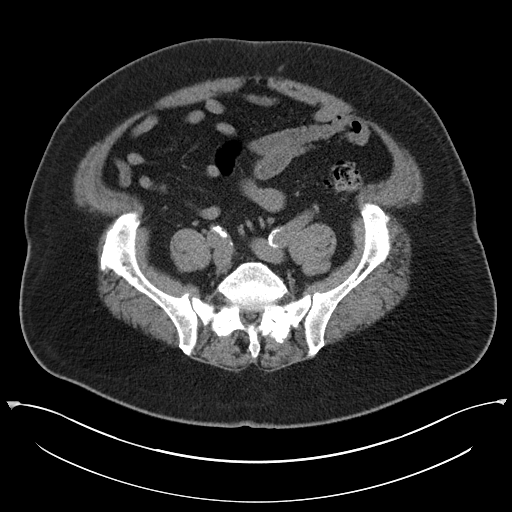
[im 43/90  soft-tissue]
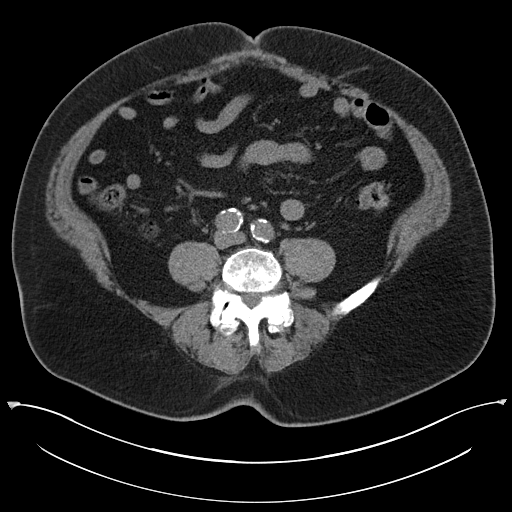
[im 50/90  soft-tissue]
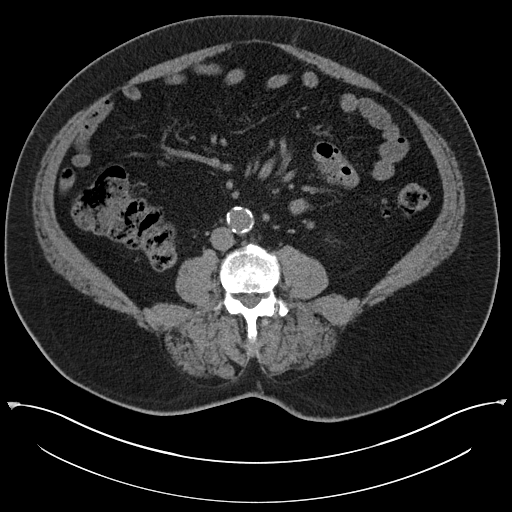
[im 57/90  soft-tissue]
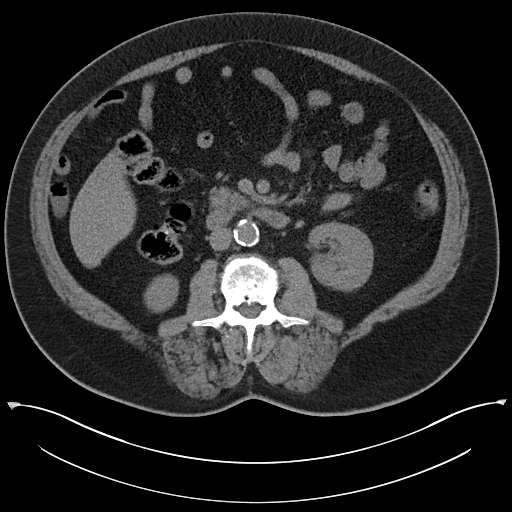
[im 65/90  soft-tissue]
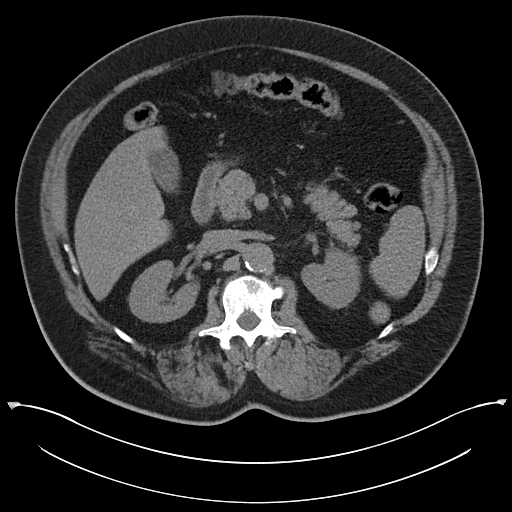
[im 65/90  bone]
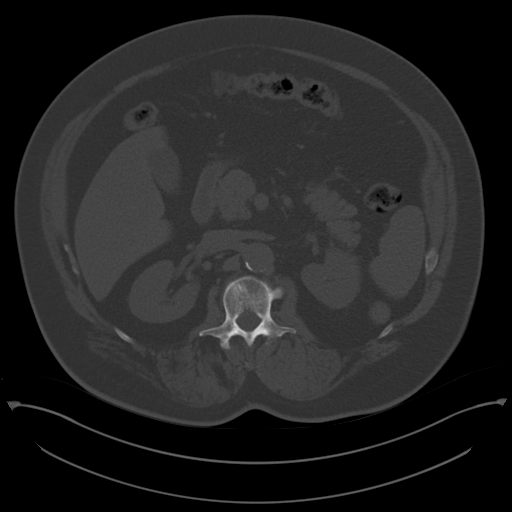
[im 72/90  soft-tissue]
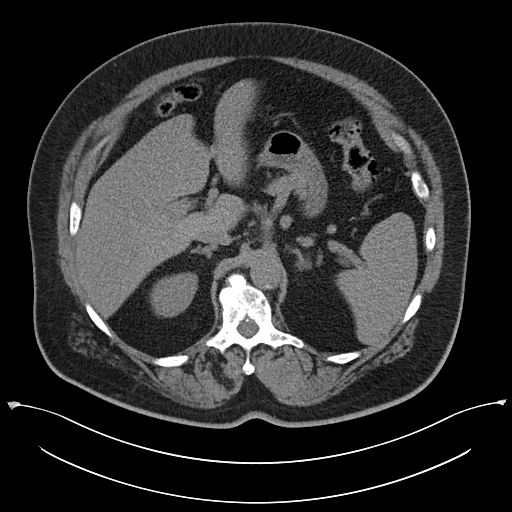
[im 79/90  soft-tissue]
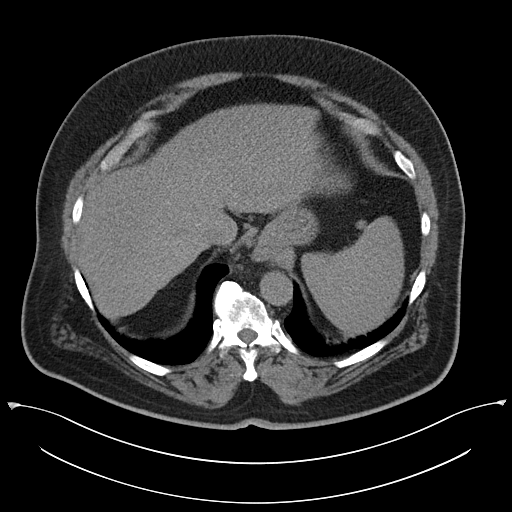
[im 86/90  soft-tissue]
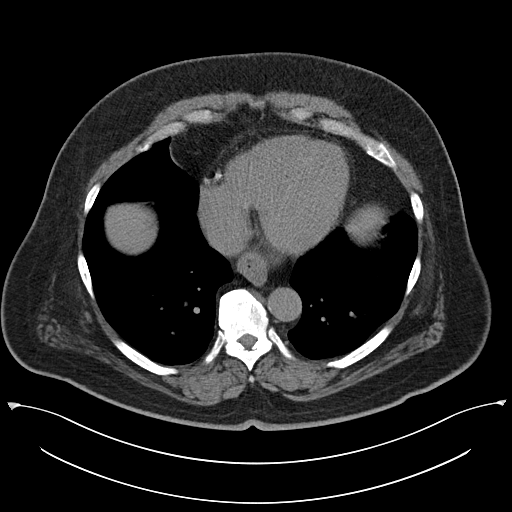

[Series 5: coronal soft tissue · coronal · 0.85mm/px · 3 of 118 slices shown]
[im 40/118  soft-tissue]
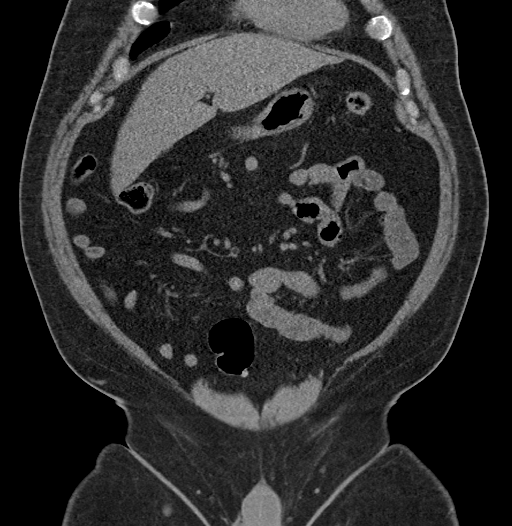
[im 53/118  soft-tissue]
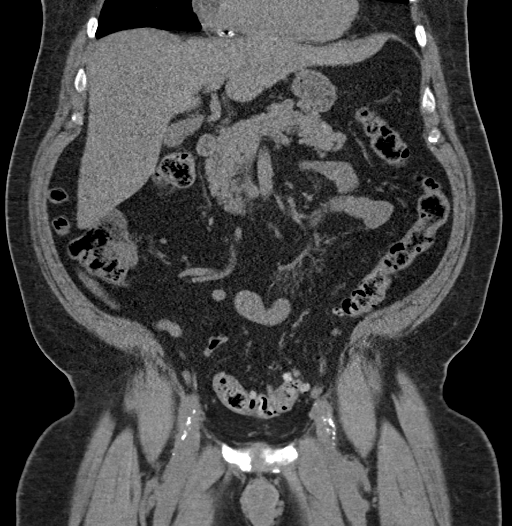
[im 66/118  soft-tissue]
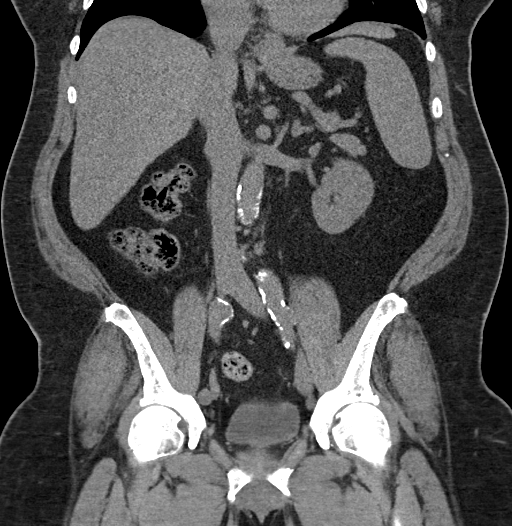

[15 of 46 positions shown; findings below may reference images not displayed]

FINDINGS: Lower chest: Lung bases are clear. There is a focal hiatal hernia.
There are foci of coronary artery calcification.

Hepatobiliary: There is a degree of hepatic steatosis. No focal
liver lesions are appreciable on this noncontrast enhanced study.
The gallbladder wall does not appear thickened. There is no biliary
duct dilatation.

Pancreas: No pancreatic mass or inflammatory focus.

Spleen: No splenic lesions are appreciable. There is a small
splenule slightly inferior to the posterior aspect of the spleen. A
second small splenule is noted more anteriorly.

Adrenals/Urinary Tract: Adrenals bilaterally appear normal. There is
no appreciable renal mass on either side. There is no evident renal
hydronephrosis on either side. There is a focal calculus in the mid
left kidney measuring 1.3 x 1.0 cm. No other renal calculi are
evident. There is no appreciable ureteral calculus on either side.
Urinary bladder is midline with wall thickness within normal limits
for degree of distention.

Stomach/Bowel: There are multiple sigmoid diverticula without
diverticulitis. There is no appreciable bowel wall or mesenteric
thickening. There is no demonstrable bowel obstruction. There is no
free air or portal venous air.

Vascular/Lymphatic: There is extensive aortic and iliac artery
atherosclerosis. There is slight dilatation of the distal aorta with
a maximum transverse diameter of 3.3 x 3.0 cm. No periaortic fluid.
No adenopathy is evident in the abdomen or pelvis.

Reproductive: The prostate is upper normal in size. Seminal vesicles
appear within normal limits. No pelvic mass evident.

Other: Appendix appears diminutive. There is no
appendiceal/periappendiceal region inflammation. There is no abscess
or ascites in the abdomen or pelvis.

Musculoskeletal: There is degenerative change throughout the lumbar
spine. There is severe spinal stenosis at L4-5 due to bony
hypertrophy and diffuse disc protrusion. There is milder stenosis
due to similar factors at L2-3. there are no blastic or lytic bone
lesions. There is no appreciable intramuscular or abdominal wall
lesion.
IMPRESSION: 1. There is a 1.3 x 1.0 cm nonobstructing calculus in the mid left
kidney. There is no hydronephrosis or ureteral calculus on either
side. Urinary bladder wall thickness is normal.

2. Extensive sigmoid diverticulosis without diverticulitis. No bowel
obstruction. No abscess in the abdomen or pelvis. No periappendiceal
region inflammation.

3.  Focal hiatal hernia.

4. There is aortic and iliac artery atherosclerosis. There are foci
of coronary artery calcification. There is mild dilatation of the
distal abdominal aorta 3.3 x 3.0 cm. Recommend followup by
ultrasound in 3 years. This recommendation follows ACR consensus
guidelines: White Paper of the ACR Incidental Findings Committee II
on Vascular Findings. [HOSPITAL] 4090; [DATE].

5. Severe spinal stenosis at L4-5 and moderate spinal stenosis at
L2-3, due to bony hypertrophy and diffuse disc protrusion.

## 2020-10-06 ENCOUNTER — Telehealth: Payer: Self-pay | Admitting: Internal Medicine

## 2020-10-06 NOTE — Telephone Encounter (Signed)
He was just admitted with severe bradycardia. Had Coreg held. Had to have a pacemaker inserted and Coreg was resumed. On amlodipine 10 mg daily, Imdur and hydralazine. If BP still elevated I would increase hydralazine to 50 mg tid. Should have follow up in pacer clinic this week and can assess BP then.  Sura Canul Martinique MD, [facility]

## 2020-10-06 NOTE — Telephone Encounter (Signed)
Patient reports when he wakes up in the morning and starts moving around he feels dizzy and has experienced chest pain. States he will sit down, if his pain is not resolved then he will take a NTG. Reports pain does improve after taking nitro. Denies any shortness of breath or palpitations. Last BP this am was 170/94. Patient states he feels the same way now as he did after having his stents placed in 2016. Remote transmission received and shows normal device function and no alerts logged. Pacemaker was implanted for CHB, presenting today is AS/VS 93 bpm. Advised patient his device does not show anything that would cause these symptoms but I will forward to triage and someone will reach out to him about further recommendations. ED precautions discussed with patient with verbal understanding.

## 2020-10-06 NOTE — Telephone Encounter (Signed)
Spoke to patient Dr.Jordan's advice given.Stated he is already taking Hydralazine 50 mg three times a day.Stated he just checked B/P 137/84.Advised to check B/P daily and keep a log.Advised to have B/P checked 3/8 at wound check appointment.Post hospital appointment scheduled with Dr.Jordan 10/20/20 at 10:20 am.Advised to bring B/P readings to appointment.Advised to call sooner if needed.

## 2020-10-06 NOTE — Telephone Encounter (Signed)
Pt of Dr. Martinique.  Will route to NL triage to address symptoms.

## 2020-10-06 NOTE — Telephone Encounter (Signed)
STAT if patient feels like he/she is going to faint   1) Are you dizzy now? no   2) Do you feel faint or have you passed out? no  3) Do you have any other symptoms? no  4) Have you checked your HR and BP (record if available)?   130/80 Average before pacemaker  169/88, 158/86, 163/88, 141/84 (all taken 10/05/20)  Patient is concerned because he gets dizzy every morning after he wakes up. He just started noticing it since he came home from the hospital after getting his pacemaker inserted

## 2020-10-06 NOTE — Telephone Encounter (Signed)
Spoke with pt, he reports the day after being discharged, when he gets up 1 st thing in the morning he will have dizziness and pressure. He checks his bp and it is usually 150's/80's. He takes 1 NTG and it will go away and not come back the rest of the day. No SOB or other symptoms. He reports walking during the day yesterday with no heaviness or problems. He reports this is similar to what he had prior to his last cath. Medications confirmed and forwarded to dr Martinique to review and advise.

## 2020-10-14 ENCOUNTER — Ambulatory Visit: Payer: No Typology Code available for payment source

## 2020-10-16 ENCOUNTER — Ambulatory Visit (INDEPENDENT_AMBULATORY_CARE_PROVIDER_SITE_OTHER): Payer: Medicare Other | Admitting: Emergency Medicine

## 2020-10-16 ENCOUNTER — Other Ambulatory Visit: Payer: Self-pay

## 2020-10-16 DIAGNOSIS — I442 Atrioventricular block, complete: Secondary | ICD-10-CM

## 2020-10-16 LAB — CUP PACEART INCLINIC DEVICE CHECK
Battery Remaining Longevity: 5.4
Brady Statistic RA Percent Paced: 3.4 %
Brady Statistic RV Percent Paced: 1 % — CL
Date Time Interrogation Session: 20220310113031
Implantable Lead Implant Date: 20220223
Implantable Lead Implant Date: 20220223
Implantable Lead Location: 753859
Implantable Lead Location: 753860
Implantable Pulse Generator Implant Date: 20220223
Lead Channel Impedance Value: 450 Ohm
Lead Channel Impedance Value: 590 Ohm
Lead Channel Pacing Threshold Amplitude: 0.75 V
Lead Channel Pacing Threshold Amplitude: 1 V
Lead Channel Pacing Threshold Pulse Width: 0.5 ms
Lead Channel Pacing Threshold Pulse Width: 0.5 ms
Lead Channel Sensing Intrinsic Amplitude: 12 mV
Lead Channel Sensing Intrinsic Amplitude: 2.8 mV
Lead Channel Setting Pacing Amplitude: 3.5 V
Lead Channel Setting Pacing Amplitude: 3.5 V
Lead Channel Setting Pacing Pulse Width: 0.5 ms
Lead Channel Setting Sensing Sensitivity: 2 mV
Pulse Gen Model: 2272
Pulse Gen Serial Number: 3922218

## 2020-10-16 NOTE — Progress Notes (Signed)
Wound check appointment. Steri-strips removed. Wound without redness or edema. Incision edges approximated, wound well healed. Normal device function. Thresholds, sensing, and impedances consistent with implant measurements. Device programmed at 3.5V for extra safety margin until 3 month visit. Histogram distribution appropriate for patient and level of activity. No mode switches or high ventricular rates noted. Patient educated about wound care, arm mobility, lifting restrictions. ROV with Dr. Rayann Heman on 01/14/21.  Patient is enrolled in remote monitoring, next scheduled check 01/06/21.

## 2020-10-17 NOTE — Progress Notes (Deleted)
Cardiology Office Note    Date:  10/17/2020   ID:  Anthony Chapman, DOB 10/12/1950, MRN [id number]  PCP:  Cassandria Anger, MD  Cardiologist:  Dr. Martinique   No chief complaint on file.   History of Present Illness:  Anthony Chapman is a 70 y.o. male with PMH of CAD (s/p DES to RCA in 2010, DES to RCA and LCx in 2015, DES to LCx 08/2016), HTN, HLD, prediabetes and chronic LBBB. Cardiac catheterization performed in January 2018 showed 100% ISR of distal RCA with left-to-right collaterals, 10% ostial LAD in-stent restenosis, 90% mid left circumflex stenosis treated with Resolute DES. He was last seen by Dr. Martinique on 12/07/2016, he was doing well at the time. The plan is to continue DAPT indefinitely.  He presented last fall with chest pain. Cardiac cath showed occlusion of a PL branch that was collateralized. No other significant disease. Continued on medical therapy.   He presented in February with symptoms of dizziness and syncope. He was severely bradycardic with HR at times in the 20s and pauses > 4 seconds. This did not improve with holding Coreg. He underwent placement of a St Jude's pacemaker on 10/01/20 by Dr Rayann Heman. Echo was normal.   Past Medical History:  Diagnosis Date  . Allergy    Spring- mild   . Anginal pain ([place])   . Arthritis    "all over" (08/27/2016)  . CAD (coronary artery disease)    a.  stent placement to the RCA in 2010 b. cath in 2015 with PCI to the RCA (2.26mm x33mm Resolute DES) and LCx (3.38mm x30mm Resolute DES).  c. 08/2016: cath showing 100% ISR of d-RCA with collaterals, 10% Ost LAD ISR, 90% mid-Cx stenosis (3.0x1mm Resolute DES placed)  . ED (erectile dysfunction)   . GERD (gastroesophageal reflux disease)   . HTN (hypertension)   . Hyperlipemia   . LBBB (left bundle branch block)   . Myocardial infarction ([place]) 2010; 2015  . Sleep apnea    wears cpap     Past Surgical History:  Procedure Laterality Date  . CARDIAC CATHETERIZATION  09/2013  .  CARDIAC CATHETERIZATION N/A 08/27/2016   Procedure: Left Heart Cath and Coronary Angiography;  Surgeon: Leonie Man, MD;  Location: [facility];  Service: Cardiovascular;  Laterality: N/A;  . CARDIAC CATHETERIZATION N/A 08/27/2016   Procedure: Coronary Stent Intervention;  Surgeon: Leonie Man, MD;  Location: [facility];  Service: Cardiovascular;  Laterality: N/A;  . COLONOSCOPY  2004  . CORONARY ANGIOPLASTY WITH STENT PLACEMENT  2010; 1/192018  . HERNIA REPAIR    . INGUINAL HERNIA REPAIR Left   . LEFT HEART CATH AND CORONARY ANGIOGRAPHY N/A 05/20/2020   Procedure: LEFT HEART CATH AND CORONARY ANGIOGRAPHY;  Surgeon: Martinique, Peter M, MD;  Location: [facility];  Service: Cardiovascular;  Laterality: N/A;  . PACEMAKER IMPLANT N/A 10/01/2020   Procedure: PACEMAKER IMPLANT;  Surgeon: [name], MD;  Location: [facility];  Service: Cardiovascular;  Laterality: N/A;  . UMBILICAL HERNIA REPAIR      Current Medications: Outpatient Medications Prior to Visit  Medication Sig Dispense Refill  . amLODipine (NORVASC) 10 MG tablet TAKE 1 TABLET(10 MG) BY MOUTH DAILY (Patient taking differently: Take 10 mg by mouth daily.) 90 tablet 1  . aspirin 81 MG EC tablet Take 1 tablet (81 mg total) by mouth daily. 30 tablet 0  . carvedilol (COREG) 25 MG tablet TAKE 1 TABLET(25  MG) BY MOUTH TWICE DAILY WITH A MEAL (Patient taking differently: Take 25 mg by mouth 2 (two) times daily with a meal.) 60 tablet 2  . cetirizine (ZYRTEC) 10 MG tablet Take 10 mg by mouth daily as needed for allergies.    . hydrALAZINE (APRESOLINE) 50 MG tablet Take 1 tablet (50 mg total) by mouth every 8 (eight) hours. 90 tablet 1  . isosorbide mononitrate (IMDUR) 60 MG 24 hr tablet Take 1 tablet (60 mg total) by mouth daily. 90 tablet 3  . montelukast (SINGULAIR) 10 MG tablet Take 10 mg by mouth daily as needed (allergies).    . nitroGLYCERIN (NITROSTAT) 0.4 MG SL tablet DISSOLVE 1 TABLET UNDER THE  TONGUE EVERY 5 MINUTES AS  NEEDED FOR CHEST PAIN. MAX  OF 3 TABLETS IN 15 MINUTES. CALL 911 IF PAIN PERSISTS. (Patient taking differently: Place under the tongue See admin instructions. Dissolve one tablet under the tongues every 5 minutes as needed for chest pain. Max of 3 tablets in 15 minutes. Call 911 if pain persists.) 100 tablet 3  . rosuvastatin (CRESTOR) 40 MG tablet Take 40 mg by mouth daily.    Marland Kitchen VITAMIN D, CHOLECALCIFEROL, PO Take 1 tablet by mouth daily as needed (vitamin d supplement).     No facility-administered medications prior to visit.     Allergies:   Losartan   Social History   Socioeconomic History  . Marital status: Married    Spouse name: Not on file  . Number of children: Not on file  . Years of education: Not on file  . Highest education level: Not on file  Occupational History  . 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 Smoking status: Current Some Day Smoker    Packs/day: 1.00    Years: 30.00    Pack years: 30.00    Types: Cigarettes, Cigars    Last attempt to quit: 08/16/1998    Years since quitting: 22.1  . Smokeless tobacco: Never Used  Substance and Sexual Activity  . Alcohol use: Yes    Comment: rare beer   . Drug use: No  . Sexual activity: Yes  Other Topics Concern  . Not on file  Social History Narrative   Occupation: 2nd shift   Married.         Social Determinants of Health   Financial Resource Strain: Not on file  Food Insecurity: Not on file  Transportation Needs: Not on file  Physical Activity: Not on file  Stress: Not on file  Social Connections: Not on file     Family History:  The patient's family history includes Arthritis in his father; CAD in his maternal aunt and mother; Hypertension in his father, mother, and another family member.   ROS:   Please see the history of present illness.    ROS All other systems reviewed and are negative.   PHYSICAL EXAM:   VS:  There were no vitals taken for this  visit.   GEN: Well nourished, well developed, in no acute distress  HEENT: normal  Neck: no JVD, carotid bruits, or masses Cardiac: RRR; no murmurs, rubs, or gallops,no edema  Respiratory:  clear to auscultation bilaterally, normal work of breathing GI: soft, nontender, nondistended, + BS MS: no deformity or atrophy  Skin: warm and dry, no rash Neuro:  Alert and Oriented x 3, Strength and sensation are intact Psych: euthymic mood, full affect  Wt Readings from Last 3 Encounters:  10/01/20 245  lb 13 oz (111.5 kg)  09/03/20 246 lb (111.6 kg)  08/21/20 246 lb (111.6 kg)      Studies/Labs Reviewed:   EKG:  EKG is ordered today.  The ekg ordered today demonstrates Normal sinus rhythm with left bundle branch block  Recent Labs: 09/30/2020: TSH 2.619 10/01/2020: ALT 34; B Natriuretic Peptide 75.4; BUN 16; Creatinine, Ser 0.93; Hemoglobin 12.9; Platelets 139; Potassium 3.7; Sodium 139 10/02/2020: Magnesium 2.1   Lipid Panel    Component Value Date/Time   CHOL 202 (H) 05/19/2020 1253   TRIG 172 (H) 05/19/2020 1253   HDL 28 (L) 05/19/2020 1253   CHOLHDL 7.2 (H) 05/19/2020 1253   CHOLHDL 4.7 12/07/2016 1516   VLDL 18 12/07/2016 1516   LDLCALC 143 (H) 05/19/2020 1253   LDLDIRECT 199.5 07/25/2012 0935    Additional studies/ records that were reviewed today include:   Cath 08/27/2016 Conclusion     Ost LAD to Prox LAD stent, 10 %stenosed.  Dist RCA Stent (Resolute DES 2.5 mg x 14 mm), 100 %stenosed. Fills via left to right collaterals  ____________CULPRIT______________________  Mid Cx lesion, 90 %stenosed after previous stent  A STENT RESOLUTE ONYX 3.0X18 drug eluting stent was successfully placed, and overlaps previously placed stent.  Post intervention, there is a 0% residual stenosis.  __________________________________________  The left ventricular ejection fraction is 50-55% by visual estimate.  LV end diastolic pressure is moderately elevated.   Assessment  difficult PCI of mid circumflex stent overlapping the previous he placed proximal stent. Chronically occluded distal RCA stent with brisk left left right collaterals. Patent LAD stent  Plan:  Overnight observation with anticipated discharge tomorrow morning.  TR band removal per protocol.  Continue aspirin plus Plavix, essentially lifelong.  He aggressively treating cardiac risk factors with statin and antihypertensives. (Beta Blocker and Calcium Channel Blocker as he is allergic to ace inhibitors)   Cardiac cath 05/20/20: LEFT HEART CATH AND CORONARY ANGIOGRAPHY    Conclusion    Prox RCA lesion is 30% stenosed.  Dist RCA lesion is 40% stenosed.  RPAV lesion is 100% stenosed.  Prox LAD to Mid LAD lesion is 25% stenosed.  Ramus lesion is 50% stenosed.  Ost Cx to Dist Cx lesion is 10% stenosed.  The left ventricular systolic function is normal.  LV end diastolic pressure is normal.  The left ventricular ejection fraction is 55-65% by visual estimate.   1. Single vessel occlusive CAD involving the PL branch of the RCA. This is well collateralized. 2. Patent stents in the LCx 3. Good LV function 4. Normal LVEDP  Plan: continue medical therapy  Echo 09/30/20: IMPRESSIONS    1. Left ventricular ejection fraction, by estimation, is 60 to 65%. The  left ventricle has normal function. The left ventricle has no regional  wall motion abnormalities. There is mild concentric left ventricular  hypertrophy. Left ventricular diastolic  parameters were normal.  2. Right ventricular systolic function is mildly reduced. The right  ventricular size is mildly enlarged.  3. The mitral valve is normal in structure. Trivial mitral valve  regurgitation. No evidence of mitral stenosis.  4. The aortic valve is tricuspid. Aortic valve regurgitation is not  visualized. No aortic stenosis is present.  5. The inferior vena cava is normal in size with <50% respiratory   variability, suggesting right atrial pressure of 8 mmHg.   Comparison(s): No prior Echocardiogram.   Conclusion(s)/Recommendation(s): Normal biventricular function without  evidence of hemodynamically significant valvular heart disease.  Bradycardic throughout study.  ASSESSMENT:    No diagnosis found.   PLAN:  In order of problems listed above:  1. CAD: Underwent DES to the left circumflex on 08/27/2016. Denying any significant chest pain with regular activity, however does notice occasional chest discomfort with extreme activity.   2. Hypertension: Blood pressure mildly elevated today, however he did not take his blood pressure medication yet. Normally it runs very well.  3. Hyperlipidemia: Last lipid panel back in May 2018 showed very well-controlled cholesterol, triglyceride, LDL 79, HDL 26. Recommend diet and exercise to further improve  4. Symptomatic bradycardia/heart block. Now s/p pacemaker implant.   5. Prediabetes: We'll defer to primary care provider    Medication Adjustments/Labs and Tests Ordered: Current medicines are reviewed at length with the patient today.  Concerns regarding medicines are outlined above.  Medication changes, Labs and Tests ordered today are listed in the Patient Instructions below. There are no Patient Instructions on file for this visit.   Signed, Peter Martinique, MD  10/17/2020 7:32 AM    [facility] [address], [place], [place]  [address] Phone: [phone]; Fax: [phone]

## 2020-10-20 ENCOUNTER — Ambulatory Visit: Payer: Medicare Other | Admitting: Cardiology

## 2020-11-07 IMAGING — US ULTRASOUND ABDOMEN LIMITED
1 series · 14 of 25 positions shown · non-contrast
Comparison: CT scan 12/01/2018

CLINICAL DATA: Abdominal discomfort with elevated liver enzymes

EXAM:
ULTRASOUND ABDOMEN LIMITED RIGHT UPPER QUADRANT

[Series 1: ultrasound abdomen limited · 14 of 70 slices shown]
[im 1/70]
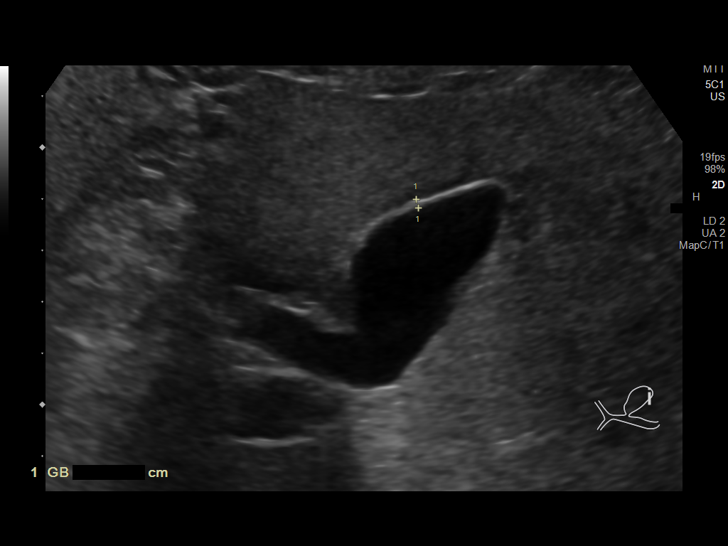
[im 6/70]
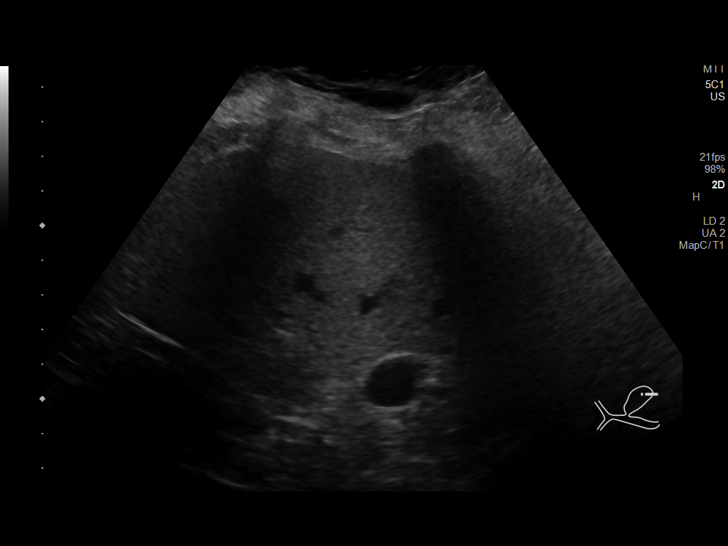
[im 12/70]
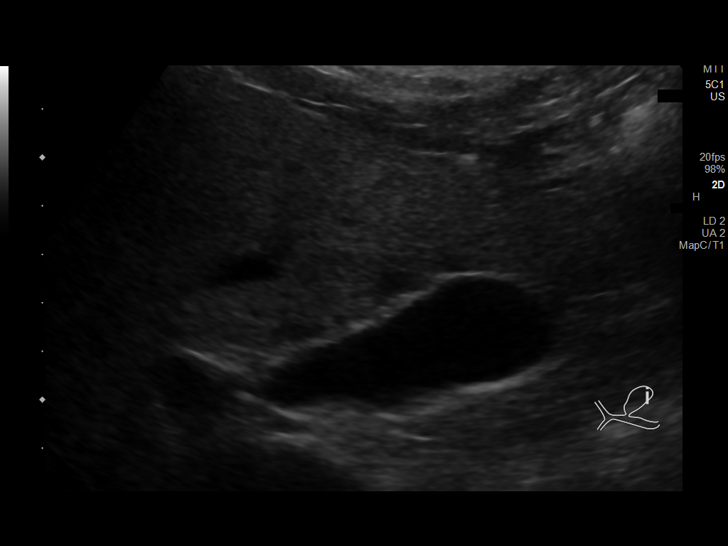
[im 18/70]
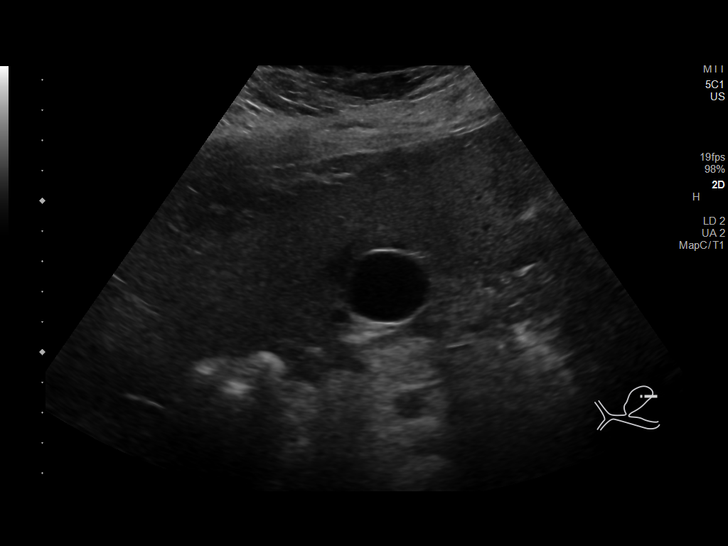
[im 24/70]
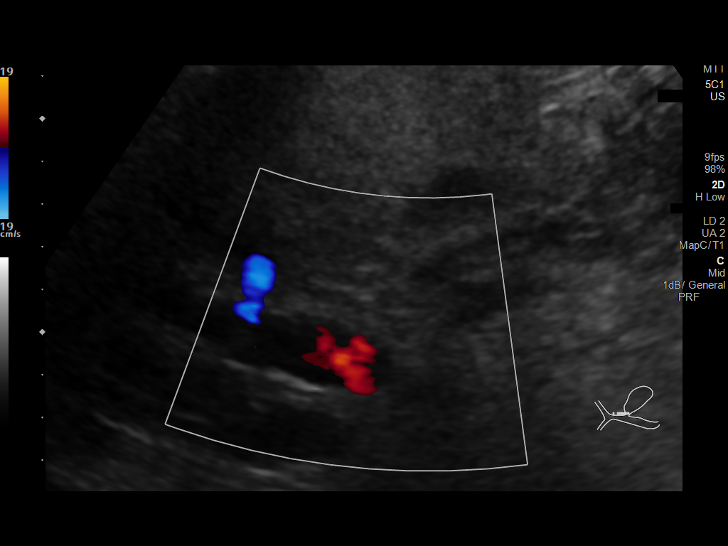
[im 26/70]
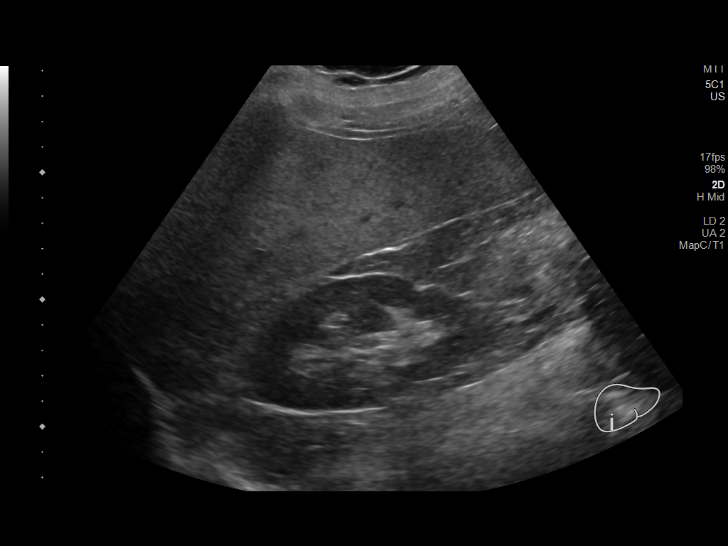
[im 32/70]
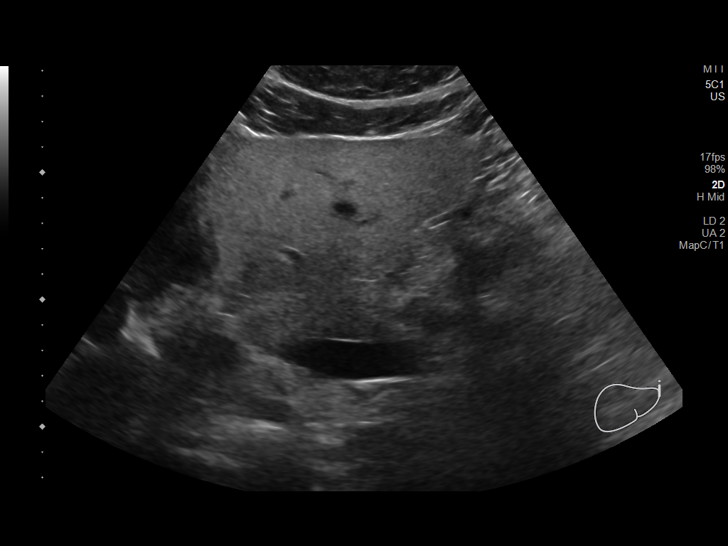
[im 38/70]
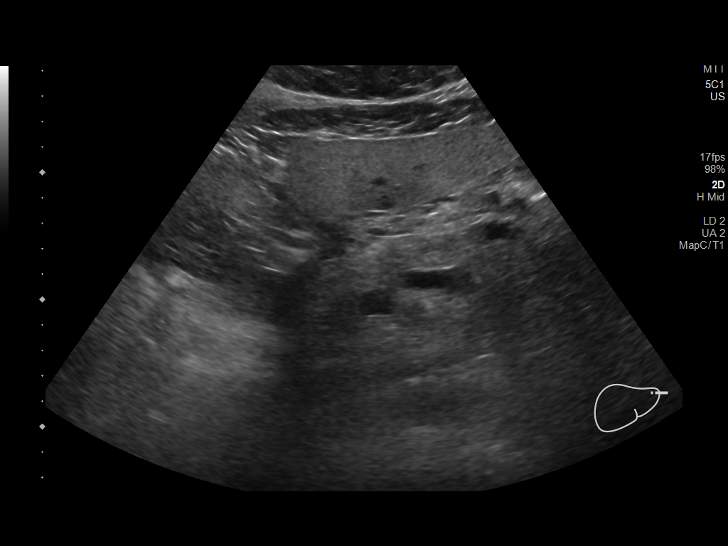
[im 44/70]
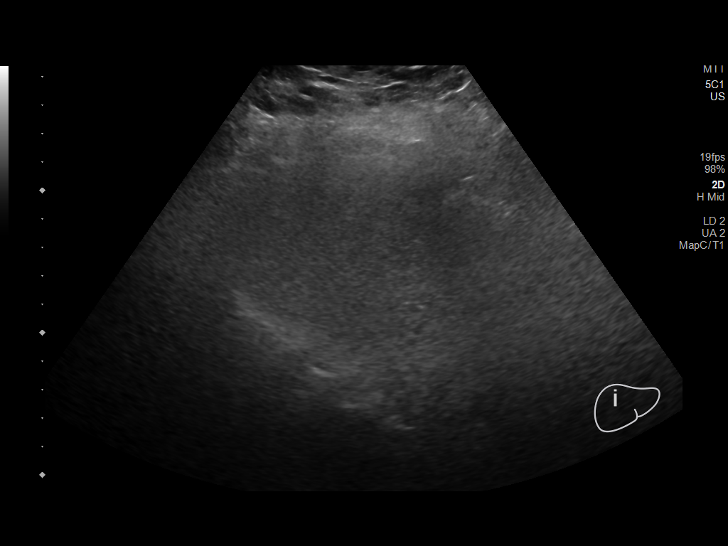
[im 47/70]
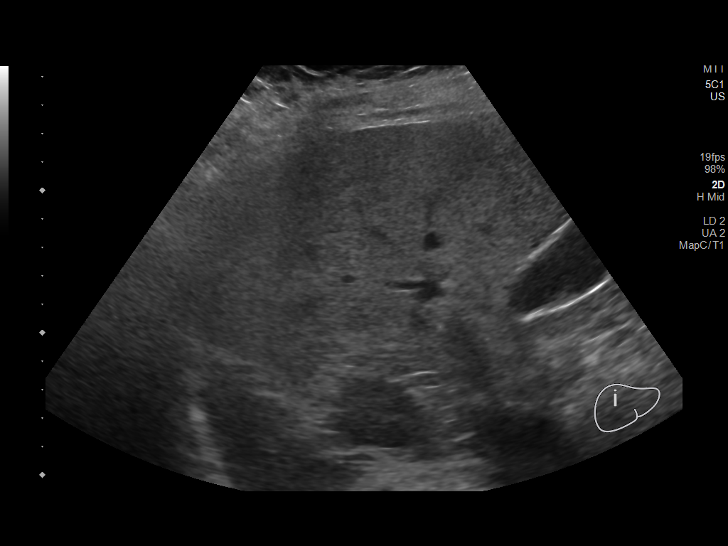
[im 52/70]
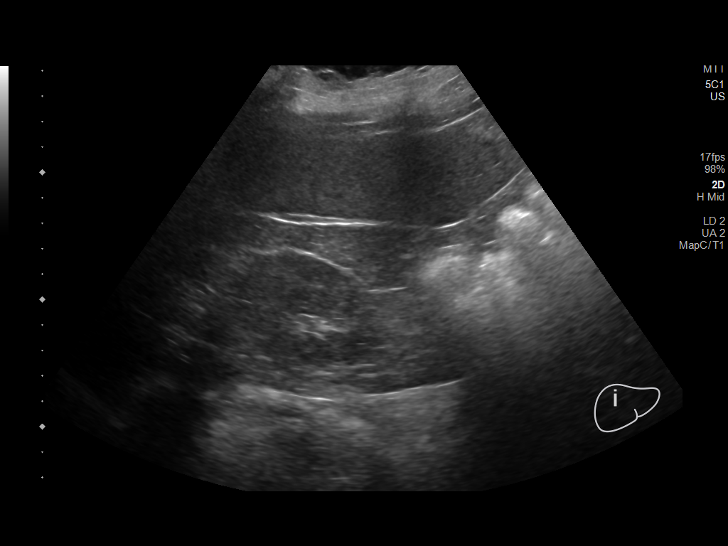
[im 58/70]
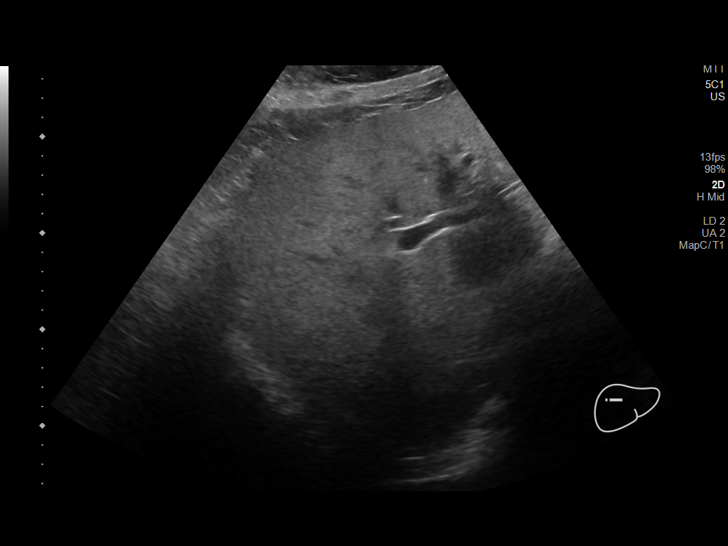
[im 64/70]
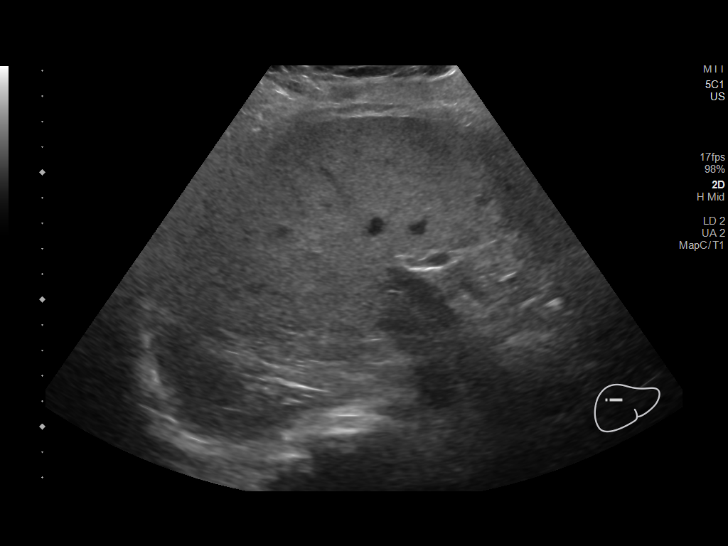
[im 70/70]
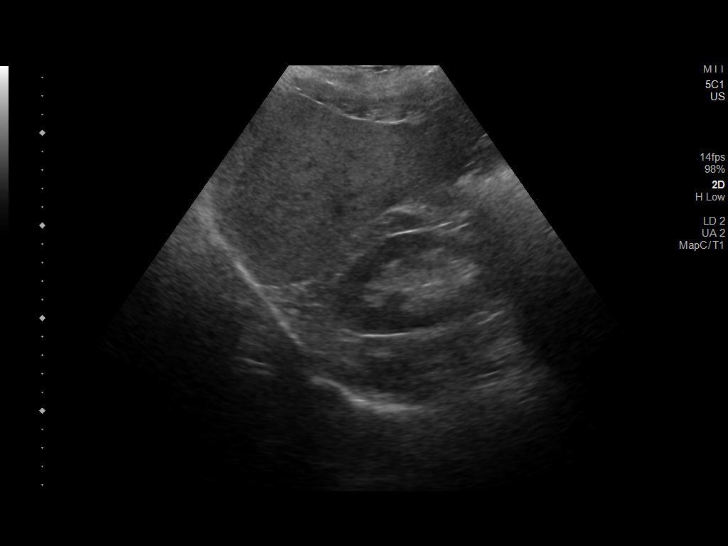

[14 of 25 positions shown; findings below may reference images not displayed]

FINDINGS: Gallbladder:

No gallstones or gallbladder wall thickening. No pericholecystic
fluid. The sonographer reports no sonographic Murphy's sign.

Common bile duct:

Diameter: 4 mm

Liver:

Increased echogenicity of the liver parenchyma suggests fatty
deposition. Portal vein is patent on color Doppler imaging with
normal direction of blood flow towards the liver.
IMPRESSION: 1. No intra or extrahepatic biliary duct dilatation.
2. Increased echogenicity of liver parenchyma suggests steatosis.

## 2020-11-26 ENCOUNTER — Other Ambulatory Visit: Payer: Self-pay | Admitting: Cardiology

## 2020-11-26 DIAGNOSIS — I10 Essential (primary) hypertension: Secondary | ICD-10-CM

## 2020-11-26 DIAGNOSIS — I2510 Atherosclerotic heart disease of native coronary artery without angina pectoris: Secondary | ICD-10-CM

## 2020-11-26 DIAGNOSIS — Z9861 Coronary angioplasty status: Secondary | ICD-10-CM

## 2021-01-06 ENCOUNTER — Ambulatory Visit (INDEPENDENT_AMBULATORY_CARE_PROVIDER_SITE_OTHER): Payer: Medicare Other

## 2021-01-06 DIAGNOSIS — I442 Atrioventricular block, complete: Secondary | ICD-10-CM

## 2021-01-07 LAB — CUP PACEART REMOTE DEVICE CHECK
Battery Remaining Longevity: 100 mo
Battery Remaining Percentage: 95.5 %
Battery Voltage: 3.04 V
Brady Statistic AP VP Percent: 1 %
Brady Statistic AP VS Percent: 2.8 %
Brady Statistic AS VP Percent: 1 %
Brady Statistic AS VS Percent: 97 %
Brady Statistic RA Percent Paced: 2.7 %
Brady Statistic RV Percent Paced: 1 %
Date Time Interrogation Session: 20220531033724
Implantable Lead Implant Date: 20220223
Implantable Lead Implant Date: 20220223
Implantable Lead Location: 753859
Implantable Lead Location: 753860
Implantable Pulse Generator Implant Date: 20220223
Lead Channel Impedance Value: 390 Ohm
Lead Channel Impedance Value: 580 Ohm
Lead Channel Pacing Threshold Amplitude: 0.75 V
Lead Channel Pacing Threshold Amplitude: 1 V
Lead Channel Pacing Threshold Pulse Width: 0.5 ms
Lead Channel Pacing Threshold Pulse Width: 0.5 ms
Lead Channel Sensing Intrinsic Amplitude: 12 mV
Lead Channel Sensing Intrinsic Amplitude: 3 mV
Lead Channel Setting Pacing Amplitude: 3.5 V
Lead Channel Setting Pacing Amplitude: 3.5 V
Lead Channel Setting Pacing Pulse Width: 0.5 ms
Lead Channel Setting Sensing Sensitivity: 2 mV
Pulse Gen Model: 2272
Pulse Gen Serial Number: 3922218

## 2021-01-14 ENCOUNTER — Other Ambulatory Visit: Payer: Self-pay

## 2021-01-14 ENCOUNTER — Ambulatory Visit (INDEPENDENT_AMBULATORY_CARE_PROVIDER_SITE_OTHER): Payer: Medicare Other | Admitting: Internal Medicine

## 2021-01-14 ENCOUNTER — Encounter: Payer: Self-pay | Admitting: Internal Medicine

## 2021-01-14 VITALS — BP 160/84 | HR 88 | Ht 69.0 in | Wt 242.8 lb

## 2021-01-14 DIAGNOSIS — I10 Essential (primary) hypertension: Secondary | ICD-10-CM

## 2021-01-14 DIAGNOSIS — I442 Atrioventricular block, complete: Secondary | ICD-10-CM | POA: Diagnosis not present

## 2021-01-14 DIAGNOSIS — I2510 Atherosclerotic heart disease of native coronary artery without angina pectoris: Secondary | ICD-10-CM | POA: Diagnosis not present

## 2021-01-14 DIAGNOSIS — Z9861 Coronary angioplasty status: Secondary | ICD-10-CM | POA: Diagnosis not present

## 2021-01-14 MED ORDER — SPIRONOLACTONE 25 MG PO TABS: 12.5000 mg | ORAL_TABLET | Freq: Every day | ORAL | 3 refills | Status: DC

## 2021-01-14 NOTE — Patient Instructions (Addendum)
Medication Instructions:  Spirolactone 12.5 mg daily   Your physician recommends that you continue on your current medications as directed. Please refer to the Current Medication list given to you today.  Labwork: None ordered.  Testing/Procedures: None ordered.  Follow-Up: Your physician wants you to follow-up in: Follow up with Dr. Doug Sou team next available. 12 months with      Tommye Standard, PA-C     You will receive a reminder letter in the mail two months in advance. If you don't receive a letter, please call our office to schedule the follow-up appointment.  Remote monitoring is used to monitor your Pacemaker from home. This monitoring reduces the number of office visits required to check your device to one time per year. It allows [place] to keep an eye on the functioning of your device to ensure it is working properly. You are scheduled for a device check from home on 04/07/21. You may send your transmission at any time that day. If you have a wireless device, the transmission will be sent automatically. After your physician reviews your transmission, you will receive a postcard with your next transmission date.  Any Other Special Instructions Will Be Listed Below (If Applicable).  If you need a refill on your cardiac medications before your next appointment, please call your pharmacy.

## 2021-01-14 NOTE — Progress Notes (Signed)
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 male who presents today for routine electrophysiology followup.  Since last being seen in our clinic, the patient reports doing reasonably well.  He continues to have angina with moderate activity. BP remains elevated.   Today, he denies symptoms of palpitations,   shortness of breath,  lower extremity edema, dizziness, presyncope, or syncope.  The patient is otherwise without complaint today.   Past Medical History:  Diagnosis Date  . Allergy    Spring- mild   . Anginal pain ([place])   . Arthritis    "all over" (08/27/2016)  . CAD (coronary artery disease)    a.  stent placement to the RCA in 2010 b. cath in 2015 with PCI to the RCA (2.104mm x64mm Resolute DES) and LCx (3.19mm x60mm Resolute DES).  c. 08/2016: cath showing 100% ISR of d-RCA with collaterals, 10% Ost LAD ISR, 90% mid-Cx stenosis (3.0x62mm Resolute DES placed)  . ED (erectile dysfunction)   . GERD (gastroesophageal reflux disease)   . HTN (hypertension)   . Hyperlipemia   . LBBB (left bundle branch block)   . Myocardial infarction ([place]) 2010; 2015  . Sleep apnea    wears cpap    Past Surgical History:  Procedure Laterality Date  . CARDIAC CATHETERIZATION  09/2013  . CARDIAC CATHETERIZATION N/A 08/27/2016   Procedure: Left Heart Cath and Coronary Angiography;  Surgeon: Leonie Man, MD;  Location: [facility];  Service: Cardiovascular;  Laterality: N/A;  . CARDIAC CATHETERIZATION N/A 08/27/2016   Procedure: Coronary Stent Intervention;  Surgeon: Leonie Man, MD;  Location: [facility];  Service: Cardiovascular;  Laterality: N/A;  . COLONOSCOPY  2004  . CORONARY ANGIOPLASTY WITH STENT PLACEMENT  2010; 1/192018  . HERNIA REPAIR    . INGUINAL HERNIA REPAIR Left   . LEFT HEART CATH AND CORONARY ANGIOGRAPHY N/A 05/20/2020   Procedure: LEFT HEART CATH AND CORONARY ANGIOGRAPHY;  Surgeon:  Martinique, Peter M, MD;  Location: [facility];  Service: Cardiovascular;  Laterality: N/A;  . PACEMAKER IMPLANT N/A 10/01/2020   Procedure: PACEMAKER IMPLANT;  Surgeon: [name], MD;  Location: [facility];  Service: Cardiovascular;  Laterality: N/A;  . UMBILICAL HERNIA REPAIR      ROS- all systems are reviewed and negative except as per HPI above  Current Outpatient Medications  Medication Sig Dispense Refill  . amLODipine (NORVASC) 10 MG tablet TAKE 1 TABLET BY MOUTH  DAILY 90 tablet 3  . aspirin 81 MG EC tablet Take 1 tablet (81 mg total) by mouth daily. 30 tablet 0  . carvedilol (COREG) 25 MG tablet Take 1 tablet (25 mg total) by mouth 2 (two) times daily with a meal. 180 tablet 3  . cetirizine (ZYRTEC) 10 MG tablet Take 10 mg by mouth daily as needed for allergies.    . hydrALAZINE (APRESOLINE) 50 MG tablet Take 1 tablet (50 mg total) by mouth every 8 (eight) hours. 90 tablet 1  . isosorbide mononitrate (IMDUR) 60 MG 24 hr tablet TAKE 1 TABLET BY MOUTH  DAILY 90 tablet 3  . montelukast (SINGULAIR) 10 MG tablet Take 10 mg by mouth daily as needed (allergies).    . nitroGLYCERIN (NITROSTAT) 0.4 MG SL tablet DISSOLVE 1 TABLET UNDER THE TONGUE EVERY 5 MINUTES AS  NEEDED FOR CHEST PAIN. MAX  OF 3 TABLETS IN 15 MINUTES. CALL  911 IF PAIN PERSISTS. (Patient taking differently: DISSOLVE 1 TABLET UNDER THE TONGUE EVERY 5 MINUTES AS  NEEDED FOR CHEST PAIN. MAX  OF 3 TABLETS IN 15 MINUTES. CALL 911 IF PAIN PERSISTS.) 100 tablet 3  . rosuvastatin (CRESTOR) 40 MG tablet TAKE 1 TABLET BY MOUTH  DAILY 90 tablet 3  . VITAMIN D, CHOLECALCIFEROL, PO Take 1 tablet by mouth daily as needed (vitamin d supplement).     No current facility-administered medications for this visit.    Physical Exam: Vitals:   01/14/21 1455  BP: (!) 160/84  Pulse: 88  SpO2: 96%  Weight: 242 lb 12.8 oz (110.1 kg)  Height: 5\' 9"  (1.753 m)    GEN- The patient is well appearing, alert and oriented x 3 today.    Head- normocephalic, atraumatic Eyes-  Sclera clear, conjunctiva pink Ears- hearing intact Oropharynx- clear Lungs-  normal work of breathing Chest- pacemaker pocket is well healed Heart- Regular rate and rhythm  GI- soft  Extremities- no clubbing, cyanosis, or edema  Pacemaker interrogation- reviewed in detail today,  See PACEART report  ekg tracing ordered today is personally reviewed and shows sinus with V pacing  Assessment and Plan:  1. Symptomatic complete heart block Normal pacemaker function See Pace Art report No changes today he is not device dependant today  2. HTN Elevated Add spironolactone 25mg  daily today  3. CAD Stable canadian class II angina Hopefully will improve with improved BP Consider ranexa Needs close follow-up with Dr Doug Sou team  Return to see Dr Martinique at the next available time Return to see EP APP annually  [name] MD, [facility] 01/14/2021 3:07 PM

## 2021-01-29 NOTE — Progress Notes (Signed)
Remote pacemaker transmission.   

## 2021-02-07 ENCOUNTER — Other Ambulatory Visit: Payer: Self-pay | Admitting: Cardiology

## 2021-02-19 ENCOUNTER — Other Ambulatory Visit: Payer: Self-pay | Admitting: Cardiology

## 2021-04-07 ENCOUNTER — Other Ambulatory Visit: Payer: Self-pay | Admitting: Physician Assistant

## 2021-04-07 ENCOUNTER — Other Ambulatory Visit: Payer: Self-pay

## 2021-04-07 ENCOUNTER — Ambulatory Visit (INDEPENDENT_AMBULATORY_CARE_PROVIDER_SITE_OTHER): Payer: Medicare Other

## 2021-04-07 ENCOUNTER — Ambulatory Visit (INDEPENDENT_AMBULATORY_CARE_PROVIDER_SITE_OTHER): Payer: Medicare Other | Admitting: Physician Assistant

## 2021-04-07 VITALS — BP 126/78 | HR 76 | Ht 69.0 in | Wt 245.4 lb

## 2021-04-07 DIAGNOSIS — I10 Essential (primary) hypertension: Secondary | ICD-10-CM | POA: Diagnosis not present

## 2021-04-07 DIAGNOSIS — E785 Hyperlipidemia, unspecified: Secondary | ICD-10-CM | POA: Diagnosis not present

## 2021-04-07 DIAGNOSIS — Z79899 Other long term (current) drug therapy: Secondary | ICD-10-CM

## 2021-04-07 DIAGNOSIS — Z950 Presence of cardiac pacemaker: Secondary | ICD-10-CM

## 2021-04-07 DIAGNOSIS — I442 Atrioventricular block, complete: Secondary | ICD-10-CM | POA: Diagnosis not present

## 2021-04-07 DIAGNOSIS — I25119 Atherosclerotic heart disease of native coronary artery with unspecified angina pectoris: Secondary | ICD-10-CM | POA: Diagnosis not present

## 2021-04-07 DIAGNOSIS — I2510 Atherosclerotic heart disease of native coronary artery without angina pectoris: Secondary | ICD-10-CM

## 2021-04-07 DIAGNOSIS — R7303 Prediabetes: Secondary | ICD-10-CM

## 2021-04-07 LAB — CUP PACEART REMOTE DEVICE CHECK
Battery Remaining Longevity: 121 mo
Battery Remaining Percentage: 95.5 %
Battery Voltage: 3.02 V
Brady Statistic AP VP Percent: 1 %
Brady Statistic AP VS Percent: 6.3 %
Brady Statistic AS VP Percent: 1 %
Brady Statistic AS VS Percent: 94 %
Brady Statistic RA Percent Paced: 6.1 %
Brady Statistic RV Percent Paced: 1 %
Date Time Interrogation Session: 20220830041255
Implantable Lead Implant Date: 20220223
Implantable Lead Implant Date: 20220223
Implantable Lead Location: 753859
Implantable Lead Location: 753860
Implantable Pulse Generator Implant Date: 20220223
Lead Channel Impedance Value: 450 Ohm
Lead Channel Impedance Value: 590 Ohm
Lead Channel Pacing Threshold Amplitude: 0.75 V
Lead Channel Pacing Threshold Amplitude: 0.75 V
Lead Channel Pacing Threshold Pulse Width: 0.5 ms
Lead Channel Pacing Threshold Pulse Width: 0.5 ms
Lead Channel Sensing Intrinsic Amplitude: 12 mV
Lead Channel Sensing Intrinsic Amplitude: 2.4 mV
Lead Channel Setting Pacing Amplitude: 2 V
Lead Channel Setting Pacing Amplitude: 2.5 V
Lead Channel Setting Pacing Pulse Width: 0.5 ms
Lead Channel Setting Sensing Sensitivity: 2 mV
Pulse Gen Model: 2272
Pulse Gen Serial Number: 3922218

## 2021-04-07 MED ORDER — ISOSORBIDE MONONITRATE ER 60 MG PO TB24: 60.0000 mg | ORAL_TABLET | Freq: Every day | ORAL | 3 refills | Status: DC

## 2021-04-07 MED ORDER — SPIRONOLACTONE 25 MG PO TABS: 12.5000 mg | ORAL_TABLET | Freq: Every day | ORAL | 3 refills | Status: DC

## 2021-04-07 MED ORDER — RANOLAZINE ER 500 MG PO TB12
ORAL_TABLET | ORAL | 0 refills | Status: DC
Start: 2021-04-07 — End: 2021-05-14

## 2021-04-07 MED ORDER — CARVEDILOL 25 MG PO TABS: 25.0000 mg | ORAL_TABLET | Freq: Two times a day (BID) | ORAL | 3 refills | Status: DC

## 2021-04-07 MED ORDER — ROSUVASTATIN CALCIUM 40 MG PO TABS: 40.0000 mg | ORAL_TABLET | Freq: Every day | ORAL | 3 refills | Status: DC

## 2021-04-07 MED ORDER — AMLODIPINE BESYLATE 10 MG PO TABS: ORAL_TABLET | ORAL | 3 refills | Status: DC

## 2021-04-07 NOTE — Patient Instructions (Addendum)
Medication Instructions:  START Ranexa 500 mg 2 times a day for 2 weeks, then INCREASE to 1000 mg (2 tablets) 2 times a day for 1 month.  INCREASE Imdur to 90 mg daily if Ranexa does not help with chest pain  TAKE Carvedilol (Coreg) 25 mg 2 times a day as prescribed.   *If you need a refill on your cardiac medications before your next appointment, please call your pharmacy*  Lab Work: Your physician recommends that you return for lab work TODAY:  BMET  If you have labs (blood work) drawn today and your tests are completely normal, you will receive your results only by: [place] (if you have MyChart) OR A paper copy in the mail If you have any lab test that is abnormal or we need to change your treatment, we will call you to review the results.  Testing/Procedures: NONE ordered at this time of appointment   Follow-Up: At [facility], you and your health needs are our priority.  As part of our continuing mission to provide you with exceptional heart care, we have created designated Provider Care Teams.  These Care Teams include your primary Cardiologist (physician) and Advanced Practice Providers (APPs -  Physician Assistants and Nurse Practitioners) who all work together to provide you with the care you need, when you need it.  Your next appointment:   4-5 month(s)  The format for your next appointment:   In Person  Provider:   Peter Martinique, MD  Other Instructions

## 2021-04-07 NOTE — Progress Notes (Signed)
Cardiology Office Note:    Date:  04/09/2021   ID:  [name], DOB May 06, 1951, MRN [id number]  PCP:  Cassandria Anger, MD   [facility] Providers Cardiologist:  Peter Martinique, MD Electrophysiologist:  [name], MD     Referring MD: Cassandria Anger, MD   Chief Complaint  Patient presents with   Follow-up    Seen for Dr. Martinique    History of Present Illness:    Anthony Chapman is a 70 y.o. male with a hx of CAD (s/p DES to RCA in 2010, DES to RCA and LCx in 2015, DES to LCx 08/2016), HTN, HLD, prediabetes and chronic LBBB. Cardiac catheterization performed in January 2018 showed 100% ISR of distal RCA with left-to-right collaterals, 10% ostial LAD in-stent restenosis, 90% mid left circumflex stenosis treated with Resolute DES.  I last saw the patient in November 2018 at which time she was doing well.  I increased his Crestor and Imdur at the time.  He developed unstable angina in October 2021 and underwent a repeat cath on 05/20/2020 that revealed 100% RPDA occlusion with distal collateralization, 50% ramus lesion, 10% ostial to distal left circumflex lesion, 30% proximal RCA, 40% distal RCA lesion, EF 55 to 65%. Medical therapy was recommended.  He developed symptomatic bradycardia in February 2022 and required implantation of [place] MRI dual-chamber pacemaker. Echocardiogram obtained on 09/30/2020 showed EF 60 to 65%, trivial MR.  He was last seen by Dr. Rayann Heman for post hospital follow-up on 01/14/2021 at which time his device is functioning normally.  He is not device dependent.  Blood pressure was elevated, spironolactone 12.5 mg daily was added to his medical regimen.   Patient presents today for follow-up.  For some reason he is taking carvedilol 25 mg daily instead of twice a day, I urged him to start taking it twice a day instead.  He has not taking hydralazine in over a month, blood pressure is normal even without hydralazine.  I will remove hydralazine  from his medication list.  After addition of low-dose spironolactone during the last visit, blood pressures completely normalized.  He will need a basic metabolic panel to check the potassium level.  Unfortunately he continues to have intermittent chest pain with physical exertion and emotional stress.  Dr. Rayann Heman recommended Ranexa.  I will start him on 500 mg twice a day of Ranexa, after 2 weeks, he will go up to 1000 mg twice a day.  If Ranexa does not change his chest pain, he may discontinue Ranexa after the first month and then try 90 mg daily of Imdur instead of 60 mg daily as a trial to see if it helps with the chest pain.  Otherwise he can see Dr. Martinique in 4 to 5 months.   Past Medical History:  Diagnosis Date   Allergy    Spring- mild    Anginal pain ([place])    Arthritis    "all over" (08/27/2016)   CAD (coronary artery disease)    a.  stent placement to the RCA in 2010 b. cath in 2015 with PCI to the RCA (2.911m x193mResolute DES) and LCx (3.11m46m59m38msolute DES).  c. 08/2016: cath showing 100% ISR of d-RCA with collaterals, 10% Ost LAD ISR, 90% mid-Cx stenosis (3.0x18mm72molute DES placed)   ED (erectile dysfunction)    GERD (gastroesophageal reflux disease)    HTN (hypertension)    Hyperlipemia    LBBB (left bundle branch block)  Myocardial infarction [facility]) 2010; 2015   Sleep apnea    wears cpap     Past Surgical History:  Procedure Laterality Date   CARDIAC CATHETERIZATION  09/2013   CARDIAC CATHETERIZATION N/A 08/27/2016   Procedure: Left Heart Cath and Coronary Angiography;  Surgeon: Leonie Man, MD;  Location: [place] [facility];  Service: Cardiovascular;  Laterality: N/A;   CARDIAC CATHETERIZATION N/A 08/27/2016   Procedure: Coronary Stent Intervention;  Surgeon: Leonie Man, MD;  Location: [place] [facility];  Service: Cardiovascular;  Laterality: N/A;   COLONOSCOPY  2004   CORONARY ANGIOPLASTY WITH STENT PLACEMENT  2010; 1/192018   HERNIA REPAIR      INGUINAL HERNIA REPAIR Left    LEFT HEART CATH AND CORONARY ANGIOGRAPHY N/A 05/20/2020   Procedure: LEFT HEART CATH AND CORONARY ANGIOGRAPHY;  Surgeon: Martinique, Peter M, MD;  Location: [place] [facility];  Service: Cardiovascular;  Laterality: N/A;   PACEMAKER IMPLANT N/A 10/01/2020   Procedure: PACEMAKER IMPLANT;  Surgeon: [name], MD;  Location: [place] [facility];  Service: Cardiovascular;  Laterality: N/A;   UMBILICAL HERNIA REPAIR      Current Medications: Current Meds  Medication Sig   aspirin 81 MG EC tablet Take 1 tablet (81 mg total) by mouth daily.   cetirizine (ZYRTEC) 10 MG tablet Take 10 mg by mouth daily as needed for allergies.   montelukast (SINGULAIR) 10 MG tablet Take 10 mg by mouth daily as needed (allergies).   nitroGLYCERIN (NITROSTAT) 0.4 MG SL tablet DISSOLVE 1 TABLET UNDER THE TONGUE EVERY 5 MINUTES AS  NEEDED FOR CHEST PAIN. MAX  OF 3 TABLETS IN 15 MINUTES. CALL 911 IF PAIN PERSISTS.   ranolazine (RANEXA) 500 MG 12 hr tablet Take 500 mg by mouth 2 times a day for 2 weeks, then INCREASE to 1000 mg 2 times a day for 1 month.   VITAMIN D, CHOLECALCIFEROL, PO Take 1 tablet by mouth daily as needed (vitamin d supplement).   [DISCONTINUED] amLODipine (NORVASC) 10 MG tablet TAKE 1 TABLET BY MOUTH  DAILY   [DISCONTINUED] carvedilol (COREG) 25 MG tablet Take 1 tablet (25 mg total) by mouth 2 (two) times daily with a meal. (Patient taking differently: Take 25 mg by mouth daily.)   [DISCONTINUED] isosorbide mononitrate (IMDUR) 60 MG 24 hr tablet TAKE 1 TABLET BY MOUTH  DAILY   [DISCONTINUED] rosuvastatin (CRESTOR) 40 MG tablet TAKE 1 TABLET BY MOUTH  DAILY   [DISCONTINUED] spironolactone (ALDACTONE) 25 MG tablet Take 0.5 tablets (12.5 mg total) by mouth daily.     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         Social Determinants of Health   Financial Resource Strain: Not on file  Food Insecurity: Not on file  Transportation Needs: Not on file  Physical Activity: Not on file  Stress: Not on file  Social Connections: Not on file     Family History: The patient's family history includes Arthritis in his father; CAD in his maternal aunt and mother; Hypertension in his father, mother, and another family member. There is no history of Colon cancer, Colon polyps, Esophageal cancer, Rectal cancer, or Stomach cancer.  ROS:   Please see the history of present illness.     All other systems reviewed and are negative.  EKGs/Labs/Other Studies Reviewed:    The following studies were reviewed today:  Cath 05/20/2020 Prox RCA lesion is 30% stenosed. Dist RCA lesion is 40% stenosed. RPAV lesion is 100% stenosed. Prox LAD to Mid LAD lesion is 25% stenosed. Ramus lesion is 50% stenosed. Ost Cx to Dist Cx lesion is 10% stenosed. The left ventricular systolic function is normal. LV end diastolic pressure is normal. The left ventricular ejection fraction is 55-65% by visual estimate.   1. Single vessel occlusive CAD involving the PL branch of the RCA. This is well collateralized. 2. Patent stents in the LCx 3. Good LV function 4. Normal LVEDP   Plan: continue medical therapy   Echo 09/30/2020 1. Left ventricular ejection fraction, by estimation, is 60 to 65%. The   left ventricle has normal function. The left ventricle has no regional  wall motion abnormalities. There is mild concentric left ventricular  hypertrophy. Left ventricular diastolic  parameters were normal.   2. Right ventricular systolic function is mildly reduced. The right  ventricular size is mildly enlarged.   3. The mitral valve is normal in structure. Trivial mitral valve  regurgitation. No evidence of mitral stenosis.   4. The aortic valve is tricuspid. Aortic valve regurgitation is not  visualized. No aortic stenosis is present.   5. The inferior vena cava is normal in size with <50% respiratory  variability, suggesting right atrial pressure of 8 mmHg.   Comparison(s): No prior Echocardiogram.   EKG:  EKG is not ordered today.    Recent Labs: 09/30/2020: TSH 2.619 10/01/2020: ALT 34; B Natriuretic Peptide 75.4; Hemoglobin 12.9; Platelets 139 10/02/2020: Magnesium 2.1 04/07/2021: BUN 14; Creatinine, Ser 0.87; Potassium 4.2; Sodium 139  Recent Lipid Panel    Component Value Date/Time   CHOL 202 (H) 05/19/2020 1253   TRIG 172 (H) 05/19/2020 1253   HDL 28 (L) 05/19/2020 1253   CHOLHDL 7.2 (H) 05/19/2020 1253   CHOLHDL 4.7 12/07/2016 1516   VLDL 18 12/07/2016 1516   LDLCALC 143 (H) 05/19/2020 1253   LDLDIRECT 199.5 07/25/2012 0935     Risk Assessment/Calculations:           Physical Exam:    VS:  BP 126/78   Pulse 76   Ht '5\' 9"'$  (1.753 m)   Wt 245 lb 6.4 oz (111.3 kg)   SpO2 95%   BMI 36.24 kg/m     Wt Readings from Last 3 Encounters:  04/07/21 245 lb 6.4 oz (111.3 kg)  01/14/21 242 lb 12.8 oz (110.1 kg)  10/01/20 245 lb 13 oz (111.5 kg)     GEN:  Well nourished, well developed in no acute distress HEENT: Normal NECK: No JVD; No carotid bruits LYMPHATICS: No lymphadenopathy CARDIAC: RRR, no murmurs, rubs, gallops RESPIRATORY:  Clear to auscultation without rales,  wheezing or rhonchi  ABDOMEN: Soft, non-tender, non-distended MUSCULOSKELETAL:  No edema;  No deformity  SKIN: Warm and dry NEUROLOGIC:  Alert and oriented x 3 PSYCHIATRIC:  Normal affect   ASSESSMENT:    1. Coronary artery disease involving native coronary artery of native heart with angina pectoris ([place])   2. Medication management   3. Essential hypertension   4. Hyperlipidemia LDL goal <70   5. Prediabetes   6. Pacemaker    PLAN:    In order of problems listed above:  CAD: Last cardiac catheterization in October 2021.  Patient continued to have persistent chest discomfort with exertion.  During the previous visit, Dr. Rayann Heman recommended a trial of Ranexa.  I will start him on 500 mg twice daily dosing with instruction to increase it to 1000 mg twice a day after 2 weeks.  If this does not control his chest discomfort, may consider try 90 mg daily of Imdur instead of the current to 60 mg.  Hypertension: Blood pressure stable  Hyperlipidemia: Continue Crestor  Prediabetes: Managed by primary care provider  Bradycardia s/p pacemaker: Followed by Dr. Rayann Heman of EP service.        Medication Adjustments/Labs and Tests Ordered: Current medicines are reviewed at length with the patient today.  Concerns regarding medicines are outlined above.  Orders Placed This Encounter  Procedures   Basic metabolic panel   Meds ordered this encounter  Medications   spironolactone (ALDACTONE) 25 MG tablet    Sig: Take 0.5 tablets (12.5 mg total) by mouth daily.    Dispense:  45 tablet    Refill:  3   ranolazine (RANEXA) 500 MG 12 hr tablet    Sig: Take 500 mg by mouth 2 times a day for 2 weeks, then INCREASE to 1000 mg 2 times a day for 1 month.    Dispense:  92 tablet    Refill:  0    Patient Instructions  Medication Instructions:  START Ranexa 500 mg 2 times a day for 2 weeks, then INCREASE to 1000 mg (2 tablets) 2 times a day for 1 month.  INCREASE Imdur to 90 mg daily if Ranexa does not help with chest pain  TAKE Carvedilol (Coreg) 25 mg 2 times a day as prescribed.    *If you need a refill on your cardiac medications before your next appointment, please call your pharmacy*  Lab Work: Your physician recommends that you return for lab work TODAY:  BMET  If you have labs (blood work) drawn today and your tests are completely normal, you will receive your results only by: [place] (if you have MyChart) OR A paper copy in the mail If you have any lab test that is abnormal or we need to change your treatment, we will call you to review the results.  Testing/Procedures: NONE ordered at this time of appointment   Follow-Up: At [facility], you and your health needs are our priority.  As part of our continuing mission to provide you with exceptional heart care, we have created designated Provider Care Teams.  These Care Teams include your primary Cardiologist (physician) and Advanced Practice Providers (APPs -  Physician Assistants and Nurse Practitioners) who all work together to provide you with the care you need, when you need it.  Your next appointment:   4-5 month(s)  The format for your next appointment:   In Person  Provider:   Peter Martinique, MD  Other Instructions    Signed, Almyra Deforest, PA  04/09/2021 3:30 PM    [facility]

## 2021-04-08 LAB — BASIC METABOLIC PANEL
BUN/Creatinine Ratio: 16 (ref 10–24)
BUN: 14 mg/dL (ref 8–27)
CO2: 24 mmol/L (ref 20–29)
Calcium: 8.5 mg/dL — ABNORMAL LOW (ref 8.6–10.2)
Chloride: 102 mmol/L (ref 96–106)
Creatinine, Ser: 0.87 mg/dL (ref 0.76–1.27)
Glucose: 127 mg/dL — ABNORMAL HIGH (ref 65–99)
Potassium: 4.2 mmol/L (ref 3.5–5.2)
Sodium: 139 mmol/L (ref 134–144)
eGFR: 93 mL/min/{1.73_m2} (ref >59)

## 2021-04-09 ENCOUNTER — Encounter: Payer: Self-pay | Admitting: Physician Assistant

## 2021-04-20 NOTE — Progress Notes (Signed)
Remote pacemaker transmission.   

## 2021-05-14 ENCOUNTER — Other Ambulatory Visit: Payer: Self-pay | Admitting: Physician Assistant

## 2021-06-30 ENCOUNTER — Other Ambulatory Visit: Payer: Self-pay | Admitting: *Deleted

## 2021-06-30 MED ORDER — RANOLAZINE ER 500 MG PO TB12: ORAL_TABLET | ORAL | 2 refills | Status: DC

## 2021-06-30 MED ORDER — RANOLAZINE ER 500 MG PO TB12: ORAL_TABLET | ORAL | 1 refills | Status: DC

## 2021-06-30 NOTE — Addendum Note (Signed)
Addended by: Carter Kitten D on: 06/30/2021 09:14 AM   Modules accepted: Orders

## 2021-07-07 ENCOUNTER — Ambulatory Visit (INDEPENDENT_AMBULATORY_CARE_PROVIDER_SITE_OTHER): Payer: Medicare Other

## 2021-07-07 DIAGNOSIS — I442 Atrioventricular block, complete: Secondary | ICD-10-CM

## 2021-07-07 LAB — CUP PACEART REMOTE DEVICE CHECK
Battery Remaining Longevity: 115 mo
Battery Remaining Percentage: 95.5 %
Battery Voltage: 3.02 V
Brady Statistic AP VP Percent: 1 %
Brady Statistic AP VS Percent: 4.8 %
Brady Statistic AS VP Percent: 16 %
Brady Statistic AS VS Percent: 79 %
Brady Statistic RA Percent Paced: 5.1 %
Brady Statistic RV Percent Paced: 17 %
Date Time Interrogation Session: 20221129020017
Implantable Lead Implant Date: 20220223
Implantable Lead Implant Date: 20220223
Implantable Lead Location: 753859
Implantable Lead Location: 753860
Implantable Pulse Generator Implant Date: 20220223
Lead Channel Impedance Value: 450 Ohm
Lead Channel Impedance Value: 580 Ohm
Lead Channel Pacing Threshold Amplitude: 0.75 V
Lead Channel Pacing Threshold Amplitude: 0.75 V
Lead Channel Pacing Threshold Pulse Width: 0.5 ms
Lead Channel Pacing Threshold Pulse Width: 0.5 ms
Lead Channel Sensing Intrinsic Amplitude: 12 mV
Lead Channel Sensing Intrinsic Amplitude: 2.1 mV
Lead Channel Setting Pacing Amplitude: 2 V
Lead Channel Setting Pacing Amplitude: 2.5 V
Lead Channel Setting Pacing Pulse Width: 0.5 ms
Lead Channel Setting Sensing Sensitivity: 2 mV
Pulse Gen Model: 2272
Pulse Gen Serial Number: 3922218

## 2021-07-16 NOTE — Progress Notes (Signed)
Remote pacemaker transmission.   

## 2021-08-21 NOTE — Progress Notes (Signed)
Cardiology Office Note:    Date:  09/04/2021   ID:  Anthony Chapman, DOB [birth year], MRN [id number]  PCP:  Cassandria Anger, MD   [facility] Providers Cardiologist:  Eliese Kerwood Martinique, MD Electrophysiologist:  [name], MD     Referring MD: Cassandria Anger, MD   Chief Complaint  Patient presents with   Coronary Artery Disease    History of Present Illness:    Anthony Chapman is a 71 y.o. male with a hx of CAD (s/p DES to RCA in 2010, DES to RCA and LCx in 2015, DES to LCx 08/2016), HTN, HLD, prediabetes and chronic LBBB. Cardiac catheterization performed in January 2018 showed 100% ISR of distal RCA with left-to-right collaterals, 10% ostial LAD in-stent restenosis, 90% mid left circumflex stenosis treated with Resolute DES.  He developed recurrent angina in October 2021 and underwent a repeat cath on 05/20/2020 that revealed 100% RPLA occlusion with distal collateralization, 50% ramus lesion, 10% ostial to distal left circumflex lesion, 30% proximal RCA, 40% distal RCA lesion, EF 55 to 65%. Medical therapy was recommended.    He developed symptomatic bradycardia in February 2022 and required implantation of [place] MRI dual-chamber pacemaker. Echocardiogram obtained on 09/30/2020 showed EF 60 to 65%, trivial MR.   He is not device dependent.  Blood pressure was elevated, spironolactone 12.5 mg daily was added to his medical regimen. Last pacer check in November was normal. On his last visit he was started on Ranexa.   On follow up today he reports he is doing well. Since starting on the Ranexa his anginal symptoms have improved significantly. Hasn't had to use sl Ntg. He is doing some walking. Has gained about 9 lbs.    Past Medical History:  Diagnosis Date   Allergy    Spring- mild    Anginal pain ([place])    Arthritis    "all over" (08/27/2016)   CAD (coronary artery disease)    a.  stent placement to the RCA in 2010 b. cath in 2015 with PCI to the RCA  (2.31mm x25mm Resolute DES) and LCx (3.56mm x56mm Resolute DES).  c. 08/2016: cath showing 100% ISR of d-RCA with collaterals, 10% Ost LAD ISR, 90% mid-Cx stenosis (3.0x74mm Resolute DES placed)   ED (erectile dysfunction)    GERD (gastroesophageal reflux disease)    HTN (hypertension)    Hyperlipemia    LBBB (left bundle branch block)    Myocardial infarction ([place]) 2010; 2015   Sleep apnea    wears cpap     Past Surgical History:  Procedure Laterality Date   CARDIAC CATHETERIZATION  09/2013   CARDIAC CATHETERIZATION N/A 08/27/2016   Procedure: Left Heart Cath and Coronary Angiography;  Surgeon: Leonie Man, MD;  Location: [place] [facility];  Service: Cardiovascular;  Laterality: N/A;   CARDIAC CATHETERIZATION N/A 08/27/2016   Procedure: Coronary Stent Intervention;  Surgeon: Leonie Man, MD;  Location: [place] [facility];  Service: Cardiovascular;  Laterality: N/A;   COLONOSCOPY  2004   CORONARY ANGIOPLASTY WITH STENT PLACEMENT  2010; 1/192018   HERNIA REPAIR     INGUINAL HERNIA REPAIR Left    LEFT HEART CATH AND CORONARY ANGIOGRAPHY N/A 05/20/2020   Procedure: LEFT HEART CATH AND CORONARY ANGIOGRAPHY;  Surgeon: Martinique, Kymorah Korf M, MD;  Location: [place] [facility];  Service: Cardiovascular;  Laterality: N/A;   PACEMAKER IMPLANT N/A 10/01/2020   Procedure: PACEMAKER IMPLANT;  Surgeon: [name], MD;  Location: [facility]  INVASIVE [facility];  Service: Cardiovascular;  Laterality: N/A;   UMBILICAL HERNIA REPAIR      Current Medications: Current Meds  Medication Sig   aspirin 81 MG EC tablet Take 1 tablet (81 mg total) by mouth daily.   carvedilol (COREG) 25 MG tablet Take 1 tablet (25 mg total) by mouth 2 (two) times daily with a meal.   cetirizine (ZYRTEC) 10 MG tablet Take 10 mg by mouth daily as needed for allergies.   montelukast (SINGULAIR) 10 MG tablet Take 10 mg by mouth daily as needed (allergies).   nitroGLYCERIN (NITROSTAT) 0.4 MG SL tablet DISSOLVE 1 TABLET UNDER THE TONGUE  EVERY 5 MINUTES AS  NEEDED FOR CHEST PAIN. MAX  OF 3 TABLETS IN 15 MINUTES. CALL 911 IF PAIN PERSISTS.   ranolazine (RANEXA) 500 MG 12 hr tablet TAKE 2 TABLETS BY MOUTH 2 TIMES DAILY   rosuvastatin (CRESTOR) 40 MG tablet Take 1 tablet (40 mg total) by mouth daily.   VITAMIN D, CHOLECALCIFEROL, PO Take 1 tablet by mouth daily as needed (vitamin d supplement).   [DISCONTINUED] amLODipine (NORVASC) 10 MG tablet TAKE 1 TABLET BY MOUTH  DAILY   [DISCONTINUED] isosorbide mononitrate (IMDUR) 60 MG 24 hr tablet Take 1 tablet (60 mg total) by mouth daily.   [DISCONTINUED] spironolactone (ALDACTONE) 25 MG tablet Take 0.5 tablets (12.5 mg total) by mouth daily.     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         Social Determinants of Health   Financial Resource Strain: Not on file  Food Insecurity: Not on file  Transportation Needs: Not on file  Physical Activity: Not on file  Stress: Not on file  Social Connections: Not on file     Family History: The patient's family history includes Arthritis in his father; CAD in his maternal aunt and mother; Hypertension in his father, mother, and another family member. There is no history of Colon cancer, Colon polyps, Esophageal cancer, Rectal cancer, or Stomach  cancer.  ROS:   Please see the history of present illness.     All other systems reviewed and are negative.  EKGs/Labs/Other Studies Reviewed:    The following studies were reviewed today:  Cath 05/20/2020 Prox RCA lesion is 30% stenosed. Dist RCA lesion is 40% stenosed. RPAV lesion is 100% stenosed. Prox LAD to Mid LAD lesion is 25% stenosed. Ramus lesion is 50% stenosed. Ost Cx to Dist Cx lesion is 10% stenosed. The left ventricular systolic function is normal. LV end diastolic pressure is normal. The left ventricular ejection fraction is 55-65% by visual estimate.  1. Single vessel occlusive CAD involving the PL branch of the RCA. This is well collateralized. 2. Patent stents in the LCx 3. Good LV function 4. Normal LVEDP   Plan: continue medical therapy   Echo 09/30/2020 1. Left ventricular ejection fraction, by estimation, is 60 to 65%. The  left ventricle has normal function. The left ventricle has no regional  wall motion abnormalities. There is mild concentric left ventricular  hypertrophy. Left ventricular diastolic  parameters were normal.   2. Right ventricular systolic function is mildly reduced. The right  ventricular size is mildly enlarged.   3. The mitral valve is normal in structure. Trivial mitral valve  regurgitation. No evidence of mitral stenosis.   4. The aortic valve is tricuspid. Aortic valve regurgitation is not  visualized. No aortic stenosis is present.   5. The inferior vena cava is normal in size with <50% respiratory  variability, suggesting right atrial pressure of 8 mmHg.   Comparison(s): No prior Echocardiogram.   EKG:  EKG is not ordered today.    Recent Labs: 09/30/2020: TSH 2.619 10/01/2020: ALT 34; B Natriuretic Peptide 75.4; Hemoglobin 12.9; Platelets 139 10/02/2020: Magnesium 2.1 04/07/2021: BUN 14; Creatinine, Ser 0.87; Potassium 4.2; Sodium 139  Recent Lipid Panel    Component Value Date/Time   CHOL 202 (H) 05/19/2020 1253    TRIG 172 (H) 05/19/2020 1253   HDL 28 (L) 05/19/2020 1253   CHOLHDL 7.2 (H) 05/19/2020 1253   CHOLHDL 4.7 12/07/2016 1516   VLDL 18 12/07/2016 1516   LDLCALC 143 (H) 05/19/2020 1253   LDLDIRECT 199.5 07/25/2012 0935      Physical Exam:    VS:  BP 126/78 (BP Location: Left Arm, Patient Position: Sitting, Cuff Size: Normal)    Pulse 94    Resp 20    Ht 5\' 9"  (1.753 m)    Wt 251 lb (113.9 kg)    SpO2 94%    BMI 37.07 kg/m     Wt Readings from Last 3 Encounters:  09/04/21 251 lb (113.9 kg)  04/07/21 245 lb 6.4 oz (111.3 kg)  01/14/21 242 lb 12.8 oz (110.1 kg)     GEN:  Well nourished, obese, in no acute distress HEENT: Normal NECK: No JVD; No carotid bruits LYMPHATICS: No lymphadenopathy CARDIAC: RRR, no murmurs, rubs, gallops RESPIRATORY:  Clear to auscultation without rales, wheezing or rhonchi  ABDOMEN: Soft, non-tender, non-distended MUSCULOSKELETAL:  No edema; No deformity  SKIN: Warm and dry NEUROLOGIC:  Alert and oriented x 3 PSYCHIATRIC:  Normal affect   ASSESSMENT:    1. Coronary artery disease involving native coronary artery of native heart with angina pectoris ([place])   2. Essential hypertension   3. CAD S/P percutaneous coronary angioplasty - 2015 PCI to RCA (2.5 x 14 Resolute DES) & Cx (3 x 12 Resolute DES)   4. Hyperlipidemia LDL goal <70   5. Pacemaker     PLAN:    In order of problems listed above:  CAD: Last cardiac catheterization in October 2021.  Occluded PL branch of the RCA. Well collateralized. Other stents patent. Angina now under excellent control on Ranexa, Imdur, amlodipine. Continue current therapy  Hypertension: Blood pressure is well controlled.   Hyperlipidemia: Continue Crestor. Needs updated lab work  Prediabetes: will check A1c  Bradycardia s/p pacemaker: Followed by EP service.        Medication Adjustments/Labs and Tests Ordered: Current medicines are reviewed at length with the patient today.  Concerns regarding  medicines are outlined  above.  Orders Placed This Encounter  Procedures   Basic metabolic panel   Lipid panel   Hepatic function panel   HgB A1c   Meds ordered this encounter  Medications   amLODipine (NORVASC) 10 MG tablet    Sig: TAKE 1 TABLET BY MOUTH  DAILY    Dispense:  90 tablet    Refill:  3   isosorbide mononitrate (IMDUR) 60 MG 24 hr tablet    Sig: Take 1 tablet (60 mg total) by mouth daily.    Dispense:  90 tablet    Refill:  3   spironolactone (ALDACTONE) 25 MG tablet    Sig: Take 0.5 tablets (12.5 mg total) by mouth daily.    Dispense:  45 tablet    Refill:  3    There are no Patient Instructions on file for this visit.   Signed, Janellie Tennison Martinique, MD  09/04/2021 1:37 PM    [place]

## 2021-08-26 ENCOUNTER — Other Ambulatory Visit: Payer: Self-pay | Admitting: Cardiology

## 2021-09-04 ENCOUNTER — Ambulatory Visit (INDEPENDENT_AMBULATORY_CARE_PROVIDER_SITE_OTHER): Payer: Medicare Other | Admitting: Cardiology

## 2021-09-04 ENCOUNTER — Other Ambulatory Visit: Payer: Self-pay

## 2021-09-04 ENCOUNTER — Encounter: Payer: Self-pay | Admitting: Cardiology

## 2021-09-04 VITALS — BP 126/78 | HR 94 | Resp 20 | Ht 69.0 in | Wt 251.0 lb

## 2021-09-04 DIAGNOSIS — I25119 Atherosclerotic heart disease of native coronary artery with unspecified angina pectoris: Secondary | ICD-10-CM | POA: Diagnosis not present

## 2021-09-04 DIAGNOSIS — I2510 Atherosclerotic heart disease of native coronary artery without angina pectoris: Secondary | ICD-10-CM

## 2021-09-04 DIAGNOSIS — E785 Hyperlipidemia, unspecified: Secondary | ICD-10-CM

## 2021-09-04 DIAGNOSIS — I10 Essential (primary) hypertension: Secondary | ICD-10-CM | POA: Diagnosis not present

## 2021-09-04 DIAGNOSIS — Z9861 Coronary angioplasty status: Secondary | ICD-10-CM

## 2021-09-04 DIAGNOSIS — Z950 Presence of cardiac pacemaker: Secondary | ICD-10-CM

## 2021-09-04 MED ORDER — SPIRONOLACTONE 25 MG PO TABS: 12.5000 mg | ORAL_TABLET | Freq: Every day | ORAL | 3 refills | Status: DC

## 2021-09-04 MED ORDER — ISOSORBIDE MONONITRATE ER 60 MG PO TB24: 60.0000 mg | ORAL_TABLET | Freq: Every day | ORAL | 3 refills | Status: DC

## 2021-09-04 MED ORDER — AMLODIPINE BESYLATE 10 MG PO TABS: ORAL_TABLET | ORAL | 3 refills | Status: DC

## 2021-09-05 LAB — LIPID PANEL
Chol/HDL Ratio: 5.9 ratio — ABNORMAL HIGH (ref 0.0–5.0)
Cholesterol, Total: 196 mg/dL (ref 100–199)
HDL: 33 mg/dL — ABNORMAL LOW (ref >39)
LDL Chol Calc (NIH): 131 mg/dL — ABNORMAL HIGH (ref 0–99)
Triglycerides: 180 mg/dL — ABNORMAL HIGH (ref 0–149)
VLDL Cholesterol Cal: 32 mg/dL (ref 5–40)

## 2021-09-05 LAB — BASIC METABOLIC PANEL
BUN/Creatinine Ratio: 14 (ref 10–24)
BUN: 14 mg/dL (ref 8–27)
CO2: 25 mmol/L (ref 20–29)
Calcium: 8.8 mg/dL (ref 8.6–10.2)
Chloride: 100 mmol/L (ref 96–106)
Creatinine, Ser: 1.01 mg/dL (ref 0.76–1.27)
Glucose: 116 mg/dL — ABNORMAL HIGH (ref 70–99)
Potassium: 4.4 mmol/L (ref 3.5–5.2)
Sodium: 137 mmol/L (ref 134–144)
eGFR: 81 mL/min/{1.73_m2} (ref >59)

## 2021-09-05 LAB — HEPATIC FUNCTION PANEL
ALT: 9 IU/L (ref 0–44)
AST: 12 IU/L (ref 0–40)
Albumin: 4 g/dL (ref 3.8–4.8)
Alkaline Phosphatase: 118 IU/L (ref 44–121)
Bilirubin Total: 0.5 mg/dL (ref 0.0–1.2)
Bilirubin, Direct: 0.14 mg/dL (ref 0.00–0.40)
Total Protein: 7.9 g/dL (ref 6.0–8.5)

## 2021-09-05 LAB — HEMOGLOBIN A1C
Est. average glucose Bld gHb Est-mCnc: 131 mg/dL
Hgb A1c MFr Bld: 6.2 % — ABNORMAL HIGH (ref 4.8–5.6)

## 2021-09-10 ENCOUNTER — Other Ambulatory Visit: Payer: Self-pay

## 2021-09-10 DIAGNOSIS — E785 Hyperlipidemia, unspecified: Secondary | ICD-10-CM

## 2021-09-10 DIAGNOSIS — Z9861 Coronary angioplasty status: Secondary | ICD-10-CM

## 2021-09-10 DIAGNOSIS — I2510 Atherosclerotic heart disease of native coronary artery without angina pectoris: Secondary | ICD-10-CM

## 2021-09-10 NOTE — Progress Notes (Signed)
Amb

## 2021-09-21 ENCOUNTER — Other Ambulatory Visit: Payer: Self-pay

## 2021-09-21 DIAGNOSIS — E785 Hyperlipidemia, unspecified: Secondary | ICD-10-CM

## 2021-09-21 DIAGNOSIS — I25119 Atherosclerotic heart disease of native coronary artery with unspecified angina pectoris: Secondary | ICD-10-CM

## 2021-09-21 NOTE — Progress Notes (Signed)
lipid

## 2021-10-06 ENCOUNTER — Ambulatory Visit (INDEPENDENT_AMBULATORY_CARE_PROVIDER_SITE_OTHER): Payer: Medicare Other

## 2021-10-06 DIAGNOSIS — I442 Atrioventricular block, complete: Secondary | ICD-10-CM

## 2021-10-06 LAB — CUP PACEART REMOTE DEVICE CHECK
Battery Remaining Longevity: 114 mo
Battery Remaining Percentage: 95 %
Battery Voltage: 3.02 V
Brady Statistic AP VP Percent: 1 %
Brady Statistic AP VS Percent: 4.2 %
Brady Statistic AS VP Percent: 11 %
Brady Statistic AS VS Percent: 85 %
Brady Statistic RA Percent Paced: 4.4 %
Brady Statistic RV Percent Paced: 11 %
Date Time Interrogation Session: 20230228042856
Implantable Lead Implant Date: 20220223
Implantable Lead Implant Date: 20220223
Implantable Lead Location: 753859
Implantable Lead Location: 753860
Implantable Pulse Generator Implant Date: 20220223
Lead Channel Impedance Value: 450 Ohm
Lead Channel Impedance Value: 560 Ohm
Lead Channel Pacing Threshold Amplitude: 0.75 V
Lead Channel Pacing Threshold Amplitude: 0.75 V
Lead Channel Pacing Threshold Pulse Width: 0.5 ms
Lead Channel Pacing Threshold Pulse Width: 0.5 ms
Lead Channel Sensing Intrinsic Amplitude: 12 mV
Lead Channel Sensing Intrinsic Amplitude: 2.7 mV
Lead Channel Setting Pacing Amplitude: 2 V
Lead Channel Setting Pacing Amplitude: 2.5 V
Lead Channel Setting Pacing Pulse Width: 0.5 ms
Lead Channel Setting Sensing Sensitivity: 2 mV
Pulse Gen Model: 2272
Pulse Gen Serial Number: 3922218

## 2021-10-12 ENCOUNTER — Ambulatory Visit: Payer: Medicare Other

## 2021-10-13 NOTE — Progress Notes (Signed)
Remote pacemaker transmission.   

## 2021-12-28 NOTE — Progress Notes (Unsigned)
$'@Patient'z$  ID: Anthony Chapman, male    DOB: 1950/08/15, 71 y.o.   MRN: [id number]  No chief complaint on file.   Referring provider: Plotnikov, Evie Lacks, MD  HPI: 71 year old male, some day smoker. PMH significant for CAD, HTN, unstable angina, OSA, hyperlipidemia, elevated PSA.   12/29/2021 Patient presents today for sleep consult.         Allergies  Allergen Reactions   Losartan Other (See Comments)    angioedema    Immunization History  Administered Date(s) Administered   Tdap 07/25/2012    Past Medical History:  Diagnosis Date   Allergy    Spring- mild    Anginal pain ([place])    Arthritis    "all over" (08/27/2016)   CAD (coronary artery disease)    a.  stent placement to the RCA in 2010 b. cath in 2015 with PCI to the RCA (2.55m x161mResolute DES) and LCx (3.60m260m51m58msolute DES).  c. 08/2016: cath showing 100% ISR of d-RCA with collaterals, 10% Ost LAD ISR, 90% mid-Cx stenosis (3.0x18mm67molute DES placed)   ED (erectile dysfunction)    GERD (gastroesophageal reflux disease)    HTN (hypertension)    Hyperlipemia    LBBB (left bundle branch block)    Myocardial infarction (HCC) [place]0; 2015   Sleep apnea    wears cpap     Tobacco History: Social History   Tobacco Use  Smoking Status Some Days   Packs/day: 1.00   Years: 30.00   Pack years: 30.00   Types: Cigarettes, Cigars   Last attempt to quit: 08/16/1998   Years since quitting: 23.3  Smokeless Tobacco Never   Ready to quit: Not Answered Counseling given: Not Answered   Outpatient Medications Prior to Visit  Medication Sig Dispense Refill   amLODipine (NORVASC) 10 MG tablet TAKE 1 TABLET BY MOUTH  DAILY 90 tablet 3   aspirin 81 MG EC tablet Take 1 tablet (81 mg total) by mouth daily. 30 tablet 0   carvedilol (COREG) 25 MG tablet Take 1 tablet (25 mg total) by mouth 2 (two) times daily with a meal. 180 tablet 3   cetirizine (ZYRTEC) 10 MG tablet Take 10 mg by mouth daily as needed for  allergies.     isosorbide mononitrate (IMDUR) 60 MG 24 hr tablet Take 1 tablet (60 mg total) by mouth daily. 90 tablet 3   montelukast (SINGULAIR) 10 MG tablet Take 10 mg by mouth daily as needed (allergies).     nitroGLYCERIN (NITROSTAT) 0.4 MG SL tablet DISSOLVE 1 TABLET UNDER THE TONGUE EVERY 5 MINUTES AS  NEEDED FOR CHEST PAIN. MAX  OF 3 TABLETS IN 15 MINUTES. CALL 911 IF PAIN PERSISTS. 45 tablet 3   ranolazine (RANEXA) 500 MG 12 hr tablet TAKE 2 TABLETS BY MOUTH 2 TIMES DAILY 360 tablet 1   rosuvastatin (CRESTOR) 40 MG tablet Take 1 tablet (40 mg total) by mouth daily. 90 tablet 3   spironolactone (ALDACTONE) 25 MG tablet Take 0.5 tablets (12.5 mg total) by mouth daily. 45 tablet 3   VITAMIN D, CHOLECALCIFEROL, PO Take 1 tablet by mouth daily as needed (vitamin d supplement).     No facility-administered medications prior to visit.      Review of Systems  Review of Systems   Physical Exam  There were no vitals taken for this visit. Physical Exam   Lab Results:  CBC    Component Value Date/Time   WBC 4.6 10/01/2020 0035  RBC 4.89 10/01/2020 0035   HGB 12.9 (L) 10/01/2020 0035   HGB 13.1 05/19/2020 1253   HCT 40.0 10/01/2020 0035   HCT 39.9 05/19/2020 1253   PLT 139 (L) 10/01/2020 0035   PLT 159 05/19/2020 1253   MCV 81.8 10/01/2020 0035   MCV 80 05/19/2020 1253   MCH 26.4 10/01/2020 0035   MCHC 32.3 10/01/2020 0035   RDW 15.6 (H) 10/01/2020 0035   RDW 13.7 05/19/2020 1253   LYMPHSABS 1.2 10/01/2020 0035   LYMPHSABS 1.5 09/07/2019 1648   MONOABS 0.5 10/01/2020 0035   EOSABS 0.4 10/01/2020 0035   EOSABS 0.4 09/07/2019 1648   BASOSABS 0.0 10/01/2020 0035   BASOSABS 0.1 09/07/2019 1648    BMET    Component Value Date/Time   NA 137 09/04/2021 1503   K 4.4 09/04/2021 1503   CL 100 09/04/2021 1503   CO2 25 09/04/2021 1503   GLUCOSE 116 (H) 09/04/2021 1503   GLUCOSE 128 (H) 10/01/2020 0035   BUN 14 09/04/2021 1503   CREATININE 1.01 09/04/2021 1503    CREATININE 0.96 12/07/2016 1516   CALCIUM 8.8 09/04/2021 1503   GFRNONAA >60 10/01/2020 0035   GFRAA 106 05/19/2020 1253    BNP    Component Value Date/Time   BNP 75.4 10/01/2020 0035    ProBNP No results found for: PROBNP  Imaging: No results found.   Assessment & Plan:   No problem-specific Assessment & Plan notes found for this encounter.     Martyn Ehrich, NP 12/28/2021

## 2021-12-29 ENCOUNTER — Ambulatory Visit (INDEPENDENT_AMBULATORY_CARE_PROVIDER_SITE_OTHER): Payer: Medicare Other | Admitting: Primary Care

## 2021-12-29 ENCOUNTER — Encounter: Payer: Self-pay | Admitting: Primary Care

## 2021-12-29 VITALS — BP 128/80 | HR 76 | Temp 98.4°F | Ht 69.0 in | Wt 255.0 lb

## 2021-12-29 DIAGNOSIS — G4733 Obstructive sleep apnea (adult) (pediatric): Secondary | ICD-10-CM

## 2021-12-29 NOTE — Patient Instructions (Addendum)
No pressure changes needed today, we will renew CPAP supplies and change DME company to [employer]. They may make [place] repeat sleep study since there was a break in therapy, we will try to avoid this if possible .   Recommendations: Aim to wear CPAP every night min 4-6 hours or longer Do not drive if tired Work on weight loss efforts  Orders: Renew CPAP supplies CPAP 5-15 Change DME company to [employer]  Follow-up: 3 months with [facility] NP or sooner if needed (compliance check)  Sleep Apnea Sleep apnea affects breathing during sleep. It causes breathing to stop for 10 seconds or more, or to become shallow. People with sleep apnea usually snore loudly. It can also increase the risk of: Heart attack. Stroke. Being very overweight (obese). Diabetes. Heart failure. Irregular heartbeat. High blood pressure. The goal of treatment is to help you breathe normally again. What are the causes?  The most common cause of this condition is a collapsed or blocked airway. There are three kinds of sleep apnea: Obstructive sleep apnea. This is caused by a blocked or collapsed airway. Central sleep apnea. This happens when the brain does not send the right signals to the muscles that control breathing. Mixed sleep apnea. This is a combination of obstructive and central sleep apnea. What increases the risk? Being overweight. Smoking. Having a small airway. Being older. Being male. Drinking alcohol. Taking medicines to calm yourself (sedatives or tranquilizers). Having family members with the condition. Having a tongue or tonsils that are larger than normal. What are the signs or symptoms? Trouble staying asleep. Loud snoring. Headaches in the morning. Waking up gasping. Dry mouth or sore throat in the morning. Being sleepy or tired during the day. If you are sleepy or tired during the day, you may also: Not be able to focus your mind (concentrate). Forget things. Get angry a lot and have mood  swings. Feel sad (depressed). Have changes in your personality. Have less interest in sex, if you are male. Be unable to have an erection, if you are male. How is this treated?  Sleeping on your side. Using a medicine to get rid of mucus in your nose (decongestant). Avoiding the use of alcohol, medicines to help you relax, or certain pain medicines (narcotics). Losing weight, if needed. Changing your diet. Quitting smoking. Using a machine to open your airway while you sleep, such as: An oral appliance. This is a mouthpiece that shifts your lower jaw forward. A CPAP device. This device blows air through a mask when you breathe out (exhale). An EPAP device. This has valves that you put in each nostril. A BIPAP device. This device blows air through a mask when you breathe in (inhale) and breathe out. Having surgery if other treatments do not work. Follow these instructions at home: Lifestyle Make changes that your doctor recommends. Eat a healthy diet. Lose weight if needed. Avoid alcohol, medicines to help you relax, and some pain medicines. Do not smoke or use any products that contain nicotine or tobacco. If you need help quitting, ask your doctor. General instructions Take over-the-counter and prescription medicines only as told by your doctor. If you were given a machine to use while you sleep, use it only as told by your doctor. If you are having surgery, make sure to tell your doctor you have sleep apnea. You may need to bring your device with you. Keep all follow-up visits. Contact a doctor if: The machine that you were given to use during  sleep bothers you or does not seem to be working. You do not get better. You get worse. Get help right away if: Your chest hurts. You have trouble breathing in enough air. You have an uncomfortable feeling in your back, arms, or stomach. You have trouble talking. One side of your body feels weak. A part of your face is hanging  down. These symptoms may be an emergency. Get help right away. Call your local emergency services (911 in the [place] Do not wait to see if the symptoms will go away. Do not drive yourself to the hospital. Summary This condition affects breathing during sleep. The most common cause is a collapsed or blocked airway. The goal of treatment is to help you breathe normally while you sleep. This information is not intended to replace advice given to you by your health care provider. Make sure you discuss any questions you have with your health care provider. Document Revised: 03/04/2021 Document Reviewed: 07/04/2020 Elsevier Patient Education  [place].   CPAP and BIPAP Information CPAP and BIPAP are methods that use air pressure to keep your airways open and to help you breathe well. CPAP and BIPAP use different amounts of pressure. Your health care provider will tell you whether CPAP or BIPAP would be more helpful for you. CPAP stands for "continuous positive airway pressure." With CPAP, the amount of pressure stays the same while you breathe in (inhale) and out (exhale). BIPAP stands for "bi-level positive airway pressure." With BIPAP, the amount of pressure will be higher when you inhale and lower when you exhale. This allows you to take larger breaths. CPAP or BIPAP may be used in the hospital, or your health care provider may want you to use it at home. You may need to have a sleep study before your health care provider can order a machine for you to use at home. What are the advantages? CPAP or BIPAP can be helpful if you have: Sleep apnea. Chronic obstructive pulmonary disease (COPD). Heart failure. Medical conditions that cause muscle weakness, including muscular dystrophy or amyotrophic lateral sclerosis (ALS). Other problems that cause breathing to be shallow, weak, abnormal, or difficult. CPAP and BIPAP are most commonly used for obstructive sleep apnea (OSA) to keep the  airways from collapsing when the muscles relax during sleep. What are the risks? Generally, this is a safe treatment. However, problems may occur, including: Irritated skin or skin sores if the mask does not fit properly. Dry or stuffy nose or nosebleeds. Dry mouth. Feeling gassy or bloated. Sinus or lung infection if the equipment is not cleaned properly. When should CPAP or BIPAP be used? In most cases, the mask only needs to be worn during sleep. Generally, the mask needs to be worn throughout the night and during any daytime naps. People with certain medical conditions may also need to wear the mask at other times, such as when they are awake. Follow instructions from your health care provider about when to use the machine. What happens during CPAP or BIPAP?  Both CPAP and BIPAP are provided by a small machine with a flexible plastic tube that attaches to a plastic mask that you wear. Air is blown through the mask into your nose or mouth. The amount of pressure that is used to blow the air can be adjusted on the machine. Your health care provider will set the pressure setting and help you find the best mask for you. Tips for using the mask Because the mask  needs to be snug, some people feel trapped or closed-in (claustrophobic) when first using the mask. If you feel this way, you may need to get used to the mask. One way to do this is to hold the mask loosely over your nose or mouth and then gradually apply the mask more snugly. You can also gradually increase the amount of time that you use the mask. Masks are available in various types and sizes. If your mask does not fit well, talk with your health care provider about getting a different one. Some common types of masks include: Full face masks, which fit over the mouth and nose. Nasal masks, which fit over the nose. Nasal pillow or prong masks, which fit into the nostrils. If you are using a mask that fits over your nose and you tend to  breathe through your mouth, a chin strap may be applied to help keep your mouth closed. Use a skin barrier to protect your skin as told by your health care provider. Some CPAP and BIPAP machines have alarms that may sound if the mask comes off or develops a leak. If you have trouble with the mask, it is very important that you talk with your health care provider about finding a way to make the mask easier to tolerate. Do not stop using the mask. There could be a negative impact on your health if you stop using the mask. Tips for using the machine Place your CPAP or BIPAP machine on a secure table or stand near an electrical outlet. Know where the on/off switch is on the machine. Follow instructions from your health care provider about how to set the pressure on your machine and when you should use it. Do not eat or drink while the CPAP or BIPAP machine is on. Food or fluids could get pushed into your lungs by the pressure of the CPAP or BIPAP. For home use, CPAP and BIPAP machines can be rented or purchased through home health care companies. Many different brands of machines are available. Renting a machine before purchasing may help you find out which particular machine works well for you. Your health insurance company may also decide which machine you may get. Keep the CPAP or BIPAP machine and attachments clean. Ask your health care provider for specific instructions. Check the humidifier if you have a dry stuffy nose or nosebleeds. Make sure it is working correctly. Follow these instructions at home: Take over-the-counter and prescription medicines only as told by your health care provider. Ask if you can take sinus medicine if your sinuses are blocked. Do not use any products that contain nicotine or tobacco. These products include cigarettes, chewing tobacco, and vaping devices, such as e-cigarettes. If you need help quitting, ask your health care provider. Keep all follow-up visits. This is  important. Contact a health care provider if: You have redness or pressure sores on your head, face, mouth, or nose from the mask or head gear. You have trouble using the CPAP or BIPAP machine. You cannot tolerate wearing the CPAP or BIPAP mask. Someone tells you that you snore even when wearing your CPAP or BIPAP. Get help right away if: You have trouble breathing. You feel confused. Summary CPAP and BIPAP are methods that use air pressure to keep your airways open and to help you breathe well. If you have trouble with the mask, it is very important that you talk with your health care provider about finding a way to make the mask  easier to tolerate. Do not stop using the mask. There could be a negative impact to your health if you stop using the mask. Follow instructions from your health care provider about when to use the machine. This information is not intended to replace advice given to you by your health care provider. Make sure you discuss any questions you have with your health care provider. Document Revised: 03/04/2021 Document Reviewed: 07/04/2020 Elsevier Patient Education  [place].

## 2021-12-29 NOTE — Assessment & Plan Note (Addendum)
-   HST 09/2016 showed moderate OSA, average AHI 16/hr. Compliance with CPAP is fair. Needs CPAP supplies renewed. Current pressure 5-15cm h20; Residual AHI 1.4/hr. No pressure changes needed today, we will renew CPAP supplies and change DME company to [employer]. May need repeat HST. Advised patient to aim to wear CPAP every night 4-6 hours or longer. No driving if experiencing excessive daytime sleepiness. Reviewed risk of untreated sleep apnea. FU 3 months

## 2022-01-05 ENCOUNTER — Ambulatory Visit (INDEPENDENT_AMBULATORY_CARE_PROVIDER_SITE_OTHER): Payer: Medicare Other

## 2022-01-05 ENCOUNTER — Telehealth: Payer: Self-pay | Admitting: Internal Medicine

## 2022-01-05 DIAGNOSIS — I442 Atrioventricular block, complete: Secondary | ICD-10-CM

## 2022-01-05 NOTE — Telephone Encounter (Signed)
°  1. Has your device fired? no ° °2. Is you device beeping? no ° °3. Are you experiencing draining or swelling at device site?no ° °4. Are you calling to see if we received your device transmission?yes ° °5. Have you passed out? no ° ° ° °Please route to Device Clinic Pool °

## 2022-01-05 NOTE — Telephone Encounter (Signed)
Remote transmission received. Normal device function. No episodes noted. Patient called and updated.

## 2022-01-06 LAB — CUP PACEART REMOTE DEVICE CHECK
Battery Remaining Longevity: 112 mo
Battery Remaining Percentage: 92 %
Battery Voltage: 3.02 V
Brady Statistic AP VP Percent: 1 %
Brady Statistic AP VS Percent: 3.7 %
Brady Statistic AS VP Percent: 7.9 %
Brady Statistic AS VS Percent: 88 %
Brady Statistic RA Percent Paced: 3.8 %
Brady Statistic RV Percent Paced: 8.1 %
Date Time Interrogation Session: 20230530020012
Implantable Lead Implant Date: 20220223
Implantable Lead Implant Date: 20220223
Implantable Lead Location: 753859
Implantable Lead Location: 753860
Implantable Pulse Generator Implant Date: 20220223
Lead Channel Impedance Value: 450 Ohm
Lead Channel Impedance Value: 550 Ohm
Lead Channel Pacing Threshold Amplitude: 0.75 V
Lead Channel Pacing Threshold Amplitude: 0.75 V
Lead Channel Pacing Threshold Pulse Width: 0.5 ms
Lead Channel Pacing Threshold Pulse Width: 0.5 ms
Lead Channel Sensing Intrinsic Amplitude: 12 mV
Lead Channel Sensing Intrinsic Amplitude: 2.3 mV
Lead Channel Setting Pacing Amplitude: 2 V
Lead Channel Setting Pacing Amplitude: 2.5 V
Lead Channel Setting Pacing Pulse Width: 0.5 ms
Lead Channel Setting Sensing Sensitivity: 2 mV
Pulse Gen Model: 2272
Pulse Gen Serial Number: 3922218

## 2022-01-06 NOTE — Progress Notes (Signed)
Reviewed and agree with assessment/plan.   Levent Kornegay, MD  Pulmonary/Critical Care 01/06/2022, 12:49 PM Pager:  [phone]  

## 2022-01-17 NOTE — Progress Notes (Unsigned)
Cardiology Office Note Date:  01/18/2022  Patient ID:  Anthony Chapman, Anthony Chapman [birth year], MRN [id number] PCP:  Cassandria Anger, MD  Cardiologist:  Dr. Martinique Electrophysiologist: Dr. Rayann Heman    Chief Complaint: annual visit  History of Present Illness: Anthony Chapman is a 71 y.o. male with history of CAD (s/p DES to RCA in 2010, DES to RCA and LCx in 2015, DES to LCx 08/2016), HTN, HLD, LBBB, pre-DM, CHB w/PPM  He comes in today to be seen for Dr. Rayann Heman, last seen by him June 2022, c/w some angina w/moderate activity, felt to be stable angina. Spironolactone was added for BP, device functioning normally. Pending visit with Dr. Martinique  Most recently saw Dr. Martinique Jan 2023, was doing well on Ranexa, had not had to use his s/l NTG, no further changes were made.  TODAY He is accompanied by his wife. He is doing well Since the addition of the Ranexa, no CP. 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   Device information Abbott dual chamber PPM implanted 10/01/2020   Past Medical History:  Diagnosis Date   Allergy    Spring- mild    Anginal pain ([place])    Arthritis    "all over" (08/27/2016)   CAD (coronary artery disease)    a.  stent placement to the RCA in 2010 b. cath in 2015 with PCI to the RCA (2.61m x142mResolute DES) and LCx (3.7m6m67m39msolute DES).  c. 08/2016: cath showing 100% ISR of d-RCA with collaterals, 10% Ost LAD ISR, 90% mid-Cx stenosis (3.0x18mm49molute DES placed)   ED (erectile dysfunction)    GERD (gastroesophageal reflux disease)    HTN (hypertension)    Hyperlipemia    LBBB (left bundle branch block)    Myocardial infarction (HCC) [place]; 2015   Sleep apnea    wears cpap     Past Surgical History:  Procedure Laterality Date   CARDIAC CATHETERIZATION  09/2013   CARDIAC CATHETERIZATION N/A 08/27/2016   Procedure: Left Heart Cath and  Coronary Angiography;  Surgeon: DavidLeonie Man  [facility];  Service: Cardiovascular;  Laterality: N/A;   CARDIAC CATHETERIZATION N/A 08/27/2016   Procedure: Coronary Stent Intervention;  Surgeon: DavidLeonie Man  [facility];  Service: Cardiovascular;  Laterality: N/A;   COLONOSCOPY  2004   CORONARY ANGIOPLASTY WITH STENT PLACEMENT  2010; 1/192018   HERNIA REPAIR     INGUINAL HERNIA REPAIR Left    LEFT HEART CATH AND CORONARY ANGIOGRAPHY N/A 05/20/2020   Procedure: LEFT HEART CATH AND CORONARY ANGIOGRAPHY;  Surgeon: JordaMartiniqueer M, MD;  [facility];  Service: Cardiovascular;  Laterality: N/A;   PACEMAKER IMPLANT N/A 10/01/2020   Procedure: PACEMAKER IMPLANT;  Surgeon: AllreThompson Grayer  [facility];  Service: Cardiovascular;  Laterality: N/A;   UMBILICAL HERNIA REPAIR      Current Outpatient Medications  Medication Sig Dispense Refill   amLODipine (NORVASC) 10 MG tablet TAKE 1 TABLET BY MOUTH  DAILY 90 tablet 3   aspirin 81 MG EC tablet Take 1 tablet (81 mg total) by mouth daily. 30 tablet 0   carvedilol (COREG) 25 MG tablet Take 1 tablet (25 mg total) by mouth 2 (two) times daily with a meal. 180 tablet 3   cetirizine (ZYRTEC) 10 MG tablet Take 10 mg  by mouth daily as needed for allergies.     isosorbide mononitrate (IMDUR) 60 MG 24 hr tablet Take 1 tablet (60 mg total) by mouth daily. 90 tablet 3   montelukast (SINGULAIR) 10 MG tablet Take 10 mg by mouth daily as needed (allergies).     nitroGLYCERIN (NITROSTAT) 0.4 MG SL tablet DISSOLVE 1 TABLET UNDER THE TONGUE EVERY 5 MINUTES AS  NEEDED FOR CHEST PAIN. MAX  OF 3 TABLETS IN 15 MINUTES. CALL 911 IF PAIN PERSISTS. 45 tablet 3   ranolazine (RANEXA) 500 MG 12 hr tablet TAKE 2 TABLETS BY MOUTH 2 TIMES DAILY 360 tablet 1   rosuvastatin (CRESTOR) 40 MG tablet Take 1 tablet (40 mg total) by mouth daily. 90 tablet 3   spironolactone (ALDACTONE) 25 MG tablet Take 0.5  tablets (12.5 mg total) by mouth daily. 45 tablet 3   VITAMIN D, CHOLECALCIFEROL, PO Take 1 tablet by mouth daily as needed (vitamin d supplement).     No current facility-administered medications for this visit.    Allergies:   Losartan   Social History:  The patient  reports that he quit smoking about 23 years ago. His smoking use included cigarettes and cigars. He has a 30.00 pack-year smoking history. He has never used smokeless tobacco. He reports current alcohol use. He reports that he does not use drugs.   Family History:  The patient's family history includes Arthritis in his father; CAD in his maternal aunt and mother; Hypertension in his father, mother, and another family member.  ROS:  Please see the history of present illness.    All other systems are reviewed and otherwise negative.   PHYSICAL EXAM:  VS:  BP (!) 142/74   Pulse 74   Ht '5\' 9"'$  (1.753 m)   Wt 255 lb (115.7 kg)   SpO2 94%   BMI 37.66 kg/m  BMI: Body mass index is 37.66 kg/m. Well nourished, well developed, in no acute distress HEENT: normocephalic, atraumatic Neck: no JVD, carotid bruits or masses Cardiac:  RRR; no significant murmurs, no rubs, or gallops Lungs:  CTA b/l, no wheezing, rhonchi or rales Abd: soft, nontender, obese MS: no deformity or atrophy Ext:  no edema Skin: warm and dry, no rash Neuro:  No gross deficits appreciated Psych: euthymic mood, full affect  PPM site is stable, no tethering or discomfort, he has a keloid that bothers him once in a while   EKG:  Done today and reviewed by myself shows  SR, LBBB  Device interrogation done today and reviewed by myself:  Battery and lead measurements are good No arrhythmias AP 3.8% VP 7.8%   Cath 05/20/2020 Prox RCA lesion is 30% stenosed. Dist RCA lesion is 40% stenosed. RPAV lesion is 100% stenosed. Prox LAD to Mid LAD lesion is 25% stenosed. Ramus lesion is 50% stenosed. Ost Cx to Dist Cx lesion is 10% stenosed. The left  ventricular systolic function is normal. LV end diastolic pressure is normal. The left ventricular ejection fraction is 55-65% by visual estimate.   1. Single vessel occlusive CAD involving the PL branch of the RCA. This is well collateralized. 2. Patent stents in the LCx 3. Good LV function 4. Normal LVEDP   Plan: continue medical therapy     Echo 09/30/2020 1. Left ventricular ejection fraction, by estimation, is 60 to 65%. The  left ventricle has normal function. The left ventricle has no regional  wall motion abnormalities. There is mild concentric left ventricular  hypertrophy. Left ventricular  diastolic  parameters were normal.   2. Right ventricular systolic function is mildly reduced. The right  ventricular size is mildly enlarged.   3. The mitral valve is normal in structure. Trivial mitral valve  regurgitation. No evidence of mitral stenosis.   4. The aortic valve is tricuspid. Aortic valve regurgitation is not  visualized. No aortic stenosis is present.   5. The inferior vena cava is normal in size with <50% respiratory  variability, suggesting right atrial pressure of 8 mmHg.   Comparison(s): No prior Echocardiogram.   Recent Labs: 09/04/2021: ALT 9; BUN 14; Creatinine, Ser 1.01; Potassium 4.4; Sodium 137  09/04/2021: Chol/HDL Ratio 5.9; Cholesterol, Total 196; HDL 33; LDL Chol Calc (NIH) 131; Triglycerides 180   CrCl cannot be calculated (Patient's most recent lab result is older than the maximum 21 days allowed.).   Wt Readings from Last 3 Encounters:  01/18/22 255 lb (115.7 kg)  12/29/21 255 lb (115.7 kg)  09/04/21 251 lb (113.9 kg)     Other studies reviewed: Additional studies/records reviewed today include: summarized above  ASSESSMENT AND PLAN:  PPM Intact function  No changes made  CAD Feels well Due to see Dr. Martinique next month  HTN A little high, he reports 58's-140 at home  HLD Sees Dr. Martinique next month  We discussed imortance of  weight loss healthy eating,low cholesterol, low triglyceride  Disposition: F/u with remotes as usual and in clinic with EP in a year, sooner if needed  Current medicines are reviewed at length with the patient today.  The patient did not have any concerns regarding medicines.  Venetia Night, PA-C 01/18/2022 10:32 AM     [place] [address] [phone] (office)  [phone] (fax)

## 2022-01-18 ENCOUNTER — Encounter: Payer: Self-pay | Admitting: Physician Assistant

## 2022-01-18 ENCOUNTER — Ambulatory Visit (INDEPENDENT_AMBULATORY_CARE_PROVIDER_SITE_OTHER): Payer: Medicare Other | Admitting: Physician Assistant

## 2022-01-18 VITALS — BP 142/74 | HR 74 | Ht 69.0 in | Wt 255.0 lb

## 2022-01-18 DIAGNOSIS — R001 Bradycardia, unspecified: Secondary | ICD-10-CM | POA: Diagnosis not present

## 2022-01-18 DIAGNOSIS — Z950 Presence of cardiac pacemaker: Secondary | ICD-10-CM | POA: Diagnosis not present

## 2022-01-18 DIAGNOSIS — I2510 Atherosclerotic heart disease of native coronary artery without angina pectoris: Secondary | ICD-10-CM | POA: Diagnosis not present

## 2022-01-18 DIAGNOSIS — I10 Essential (primary) hypertension: Secondary | ICD-10-CM

## 2022-01-18 LAB — CUP PACEART INCLINIC DEVICE CHECK
Battery Remaining Longevity: 126 mo
Battery Voltage: 3.02 V
Brady Statistic RA Percent Paced: 3.7 %
Brady Statistic RV Percent Paced: 7.8 %
Date Time Interrogation Session: 20230612102500
Implantable Lead Implant Date: 20220223
Implantable Lead Implant Date: 20220223
Implantable Lead Location: 753859
Implantable Lead Location: 753860
Implantable Pulse Generator Implant Date: 20220223
Lead Channel Impedance Value: 437.5 Ohm
Lead Channel Impedance Value: 537.5 Ohm
Lead Channel Pacing Threshold Amplitude: 0.75 V
Lead Channel Pacing Threshold Amplitude: 0.75 V
Lead Channel Pacing Threshold Amplitude: 0.75 V
Lead Channel Pacing Threshold Amplitude: 0.75 V
Lead Channel Pacing Threshold Pulse Width: 0.5 ms
Lead Channel Pacing Threshold Pulse Width: 0.5 ms
Lead Channel Pacing Threshold Pulse Width: 0.5 ms
Lead Channel Pacing Threshold Pulse Width: 0.5 ms
Lead Channel Sensing Intrinsic Amplitude: 12 mV
Lead Channel Sensing Intrinsic Amplitude: 2.2 mV
Lead Channel Setting Pacing Amplitude: 2 V
Lead Channel Setting Pacing Amplitude: 2.5 V
Lead Channel Setting Pacing Pulse Width: 0.5 ms
Lead Channel Setting Sensing Sensitivity: 2 mV
Pulse Gen Model: 2272
Pulse Gen Serial Number: 3900218

## 2022-01-18 NOTE — Patient Instructions (Signed)
Medication Instructions:   Your physician recommends that you continue on your current medications as directed. Please refer to the Current Medication list given to you today.   *If you need a refill on your cardiac medications before your next appointment, please call your pharmacy*   Lab Work: [place]   If you have labs (blood work) drawn today and your tests are completely normal, you will receive your results only by: [place] (if you have MyChart) OR A paper copy in the mail If you have any lab test that is abnormal or we need to change your treatment, we will call you to review the results.   Testing/Procedures: NONE ORDERED  TODAY     Follow-Up: At [facility], you and your health needs are our priority.  As part of our continuing mission to provide you with exceptional heart care, we have created designated Provider Care Teams.  These Care Teams include your primary Cardiologist (physician) and Advanced Practice Providers (APPs -  Physician Assistants and Nurse Practitioners) who all work together to provide you with the care you need, when you need it.  We recommend signing up for the patient portal called "MyChart".  Sign up information is provided on this After Visit Summary.  MyChart is used to connect with patients for Virtual Visits (Telemedicine).  Patients are able to view lab/test results, encounter notes, upcoming appointments, etc.  Non-urgent messages can be sent to your provider as well.   To learn more about what you can do with MyChart, go to [email or web address].    Your next appointment:     NEXT AVAILABLE APPOINTMENT WITH DR Martinique ( [place])    1 year(s)  The format for your next appointment:   In Person  Provider:   Tommye Standard, PA-C    Other Instructions   Important Information About Sugar

## 2022-01-21 NOTE — Progress Notes (Signed)
Remote pacemaker transmission.   

## 2022-01-23 ENCOUNTER — Other Ambulatory Visit: Payer: Self-pay | Admitting: Internal Medicine

## 2022-01-28 ENCOUNTER — Ambulatory Visit (INDEPENDENT_AMBULATORY_CARE_PROVIDER_SITE_OTHER): Payer: Medicare Other | Admitting: Internal Medicine

## 2022-01-28 ENCOUNTER — Encounter: Payer: Self-pay | Admitting: Internal Medicine

## 2022-01-28 VITALS — BP 130/70 | HR 72 | Temp 97.9°F | Ht 69.0 in | Wt 257.0 lb

## 2022-01-28 DIAGNOSIS — I10 Essential (primary) hypertension: Secondary | ICD-10-CM

## 2022-01-28 DIAGNOSIS — Z0000 Encounter for general adult medical examination without abnormal findings: Secondary | ICD-10-CM | POA: Diagnosis not present

## 2022-01-28 DIAGNOSIS — M179 Osteoarthritis of knee, unspecified: Secondary | ICD-10-CM | POA: Insufficient documentation

## 2022-01-28 DIAGNOSIS — I2510 Atherosclerotic heart disease of native coronary artery without angina pectoris: Secondary | ICD-10-CM

## 2022-01-28 DIAGNOSIS — M545 Low back pain, unspecified: Secondary | ICD-10-CM | POA: Insufficient documentation

## 2022-01-28 DIAGNOSIS — R972 Elevated prostate specific antigen [PSA]: Secondary | ICD-10-CM

## 2022-01-28 DIAGNOSIS — Z9861 Coronary angioplasty status: Secondary | ICD-10-CM

## 2022-01-28 DIAGNOSIS — E669 Obesity, unspecified: Secondary | ICD-10-CM | POA: Insufficient documentation

## 2022-01-28 DIAGNOSIS — R739 Hyperglycemia, unspecified: Secondary | ICD-10-CM | POA: Diagnosis not present

## 2022-01-28 DIAGNOSIS — Z6837 Body mass index (BMI) 37.0-37.9, adult: Secondary | ICD-10-CM

## 2022-01-28 LAB — COMPREHENSIVE METABOLIC PANEL
ALT: 9 U/L (ref 0–53)
AST: 13 U/L (ref 0–37)
Albumin: 3.6 g/dL (ref 3.5–5.2)
Alkaline Phosphatase: 90 U/L (ref 39–117)
BUN: 15 mg/dL (ref 6–23)
CO2: 28 mEq/L (ref 19–32)
Calcium: 9 mg/dL (ref 8.4–10.5)
Chloride: 102 mEq/L (ref 96–112)
Creatinine, Ser: 1.04 mg/dL (ref 0.40–1.50)
GFR: 72.82 mL/min (ref >60.00)
Glucose, Bld: 121 mg/dL — ABNORMAL HIGH (ref 70–99)
Potassium: 4.1 mEq/L (ref 3.5–5.1)
Sodium: 137 mEq/L (ref 135–145)
Total Bilirubin: 0.5 mg/dL (ref 0.2–1.2)
Total Protein: 7.2 g/dL (ref 6.0–8.3)

## 2022-01-28 LAB — URINALYSIS
Bilirubin Urine: NEGATIVE
Hgb urine dipstick: NEGATIVE
Ketones, ur: NEGATIVE
Leukocytes,Ua: NEGATIVE
Nitrite: NEGATIVE
Specific Gravity, Urine: >=1.03 — AB (ref 1.000–1.030)
Total Protein, Urine: NEGATIVE
Urine Glucose: NEGATIVE
Urobilinogen, UA: 1 (ref 0.0–1.0)
pH: 5.5 (ref 5.0–8.0)

## 2022-01-28 LAB — CBC WITH DIFFERENTIAL/PLATELET
Basophils Absolute: 0 10*3/uL (ref 0.0–0.1)
Basophils Relative: 0.6 % (ref 0.0–3.0)
Eosinophils Absolute: 0.3 10*3/uL (ref 0.0–0.7)
Eosinophils Relative: 6.2 % — ABNORMAL HIGH (ref 0.0–5.0)
HCT: 38.7 % — ABNORMAL LOW (ref 39.0–52.0)
Hemoglobin: 12.6 g/dL — ABNORMAL LOW (ref 13.0–17.0)
Lymphocytes Relative: 21.5 % (ref 12.0–46.0)
Lymphs Abs: 1 10*3/uL (ref 0.7–4.0)
MCHC: 32.5 g/dL (ref 30.0–36.0)
MCV: 83 fl (ref 78.0–100.0)
Monocytes Absolute: 0.6 10*3/uL (ref 0.1–1.0)
Monocytes Relative: 14.1 % — ABNORMAL HIGH (ref 3.0–12.0)
Neutro Abs: 2.7 10*3/uL (ref 1.4–7.7)
Neutrophils Relative %: 57.6 % (ref 43.0–77.0)
Platelets: 142 10*3/uL — ABNORMAL LOW (ref 150.0–400.0)
RBC: 4.67 Mil/uL (ref 4.22–5.81)
RDW: 15 % (ref 11.5–15.5)
WBC: 4.6 10*3/uL (ref 4.0–10.5)

## 2022-01-28 LAB — LIPID PANEL
Cholesterol: 163 mg/dL (ref 0–200)
HDL: 33.2 mg/dL — ABNORMAL LOW (ref >39.00)
LDL Cholesterol: 109 mg/dL — ABNORMAL HIGH (ref 0–99)
NonHDL: 129.45
Total CHOL/HDL Ratio: 5
Triglycerides: 104 mg/dL (ref 0.0–149.0)
VLDL: 20.8 mg/dL (ref 0.0–40.0)

## 2022-01-28 LAB — TSH: TSH: 5.22 u[IU]/mL (ref 0.35–5.50)

## 2022-01-28 LAB — PSA: PSA: 4.76 ng/mL — ABNORMAL HIGH (ref 0.10–4.00)

## 2022-01-28 LAB — HEMOGLOBIN A1C: Hgb A1c MFr Bld: 6.6 % — ABNORMAL HIGH (ref 4.6–6.5)

## 2022-01-28 MED ORDER — RANOLAZINE ER 500 MG PO TB12
ORAL_TABLET | ORAL | 3 refills | Status: AC
Start: 2022-01-28 — End: ?

## 2022-01-28 MED ORDER — ROSUVASTATIN CALCIUM 40 MG PO TABS: 40.0000 mg | ORAL_TABLET | Freq: Every day | ORAL | 3 refills | Status: DC

## 2022-01-28 MED ORDER — CETIRIZINE HCL 10 MG PO TABS: 10.0000 mg | ORAL_TABLET | Freq: Every day | ORAL | 3 refills | Status: DC | PRN

## 2022-01-28 MED ORDER — SPIRONOLACTONE 25 MG PO TABS
12.5000 mg | ORAL_TABLET | Freq: Every day | ORAL | 3 refills | Status: DC
Start: 2022-01-28 — End: 2022-09-16

## 2022-01-28 MED ORDER — ISOSORBIDE MONONITRATE ER 60 MG PO TB24: 60.0000 mg | ORAL_TABLET | Freq: Every day | ORAL | 3 refills | Status: DC

## 2022-01-28 MED ORDER — CARVEDILOL 25 MG PO TABS: 25.0000 mg | ORAL_TABLET | Freq: Two times a day (BID) | ORAL | 3 refills | Status: DC

## 2022-01-28 MED ORDER — AMLODIPINE BESYLATE 10 MG PO TABS
ORAL_TABLET | ORAL | 3 refills | Status: DC
Start: 2022-01-28 — End: 2022-09-16

## 2022-01-28 MED ORDER — NITROGLYCERIN 0.4 MG SL SUBL: SUBLINGUAL_TABLET | SUBLINGUAL | 3 refills | Status: DC

## 2022-01-28 NOTE — Progress Notes (Signed)
Subjective:  Patient ID: Anthony Chapman, male    DOB: 12-17-1950  Age: 71 y.o. MRN: [id number]  CC: No chief complaint on file.   HPI DREYTON ROESSNER presents for a well exam F/u HTN, CAD, dyslipidemia  Outpatient Medications Prior to Visit  Medication Sig Dispense Refill   aspirin 81 MG EC tablet Take 1 tablet (81 mg total) by mouth daily. 30 tablet 0   montelukast (SINGULAIR) 10 MG tablet Take 10 mg by mouth daily as needed (allergies).     VITAMIN D, CHOLECALCIFEROL, PO Take 1 tablet by mouth daily as needed (vitamin d supplement).     amLODipine (NORVASC) 10 MG tablet TAKE 1 TABLET BY MOUTH  DAILY 90 tablet 3   carvedilol (COREG) 25 MG tablet Take 1 tablet (25 mg total) by mouth 2 (two) times daily with a meal. 180 tablet 3   cetirizine (ZYRTEC) 10 MG tablet Take 10 mg by mouth daily as needed for allergies.     isosorbide mononitrate (IMDUR) 60 MG 24 hr tablet Take 1 tablet (60 mg total) by mouth daily. 90 tablet 3   nitroGLYCERIN (NITROSTAT) 0.4 MG SL tablet DISSOLVE 1 TABLET UNDER THE TONGUE EVERY 5 MINUTES AS  NEEDED FOR CHEST PAIN. MAX  OF 3 TABLETS IN 15 MINUTES. CALL 911 IF PAIN PERSISTS. 45 tablet 3   ranolazine (RANEXA) 500 MG 12 hr tablet TAKE 2 TABLETS BY MOUTH TWICE  DAILY 360 tablet 3   rosuvastatin (CRESTOR) 40 MG tablet Take 1 tablet (40 mg total) by mouth daily. 90 tablet 3   spironolactone (ALDACTONE) 25 MG tablet Take 0.5 tablets (12.5 mg total) by mouth daily. 45 tablet 3   No facility-administered medications prior to visit.    ROS: Review of Systems  Constitutional:  Negative for appetite change, fatigue and unexpected weight change.  HENT:  Negative for congestion, nosebleeds, sneezing, sore throat and trouble swallowing.   Eyes:  Negative for itching and visual disturbance.  Respiratory:  Negative for cough.   Cardiovascular:  Negative for chest pain, palpitations and leg swelling.  Gastrointestinal:  Negative for abdominal distention, blood in stool,  diarrhea and nausea.  Genitourinary:  Negative for frequency and hematuria.  Musculoskeletal:  Negative for back pain, gait problem, joint swelling and neck pain.  Skin:  Negative for rash.  Neurological:  Negative for dizziness, tremors, speech difficulty and weakness.  Psychiatric/Behavioral:  Negative for agitation, dysphoric mood, sleep disturbance and suicidal ideas. The patient is not nervous/anxious.     Objective:  BP 130/70 (BP Location: Left Arm, Patient Position: Sitting, Cuff Size: Normal)   Pulse 72   Temp 97.9 F (36.6 C) (Oral)   Ht '5\' 9"'$  (1.753 m)   Wt 257 lb (116.6 kg)   SpO2 91%   BMI 37.95 kg/m   BP Readings from Last 3 Encounters:  01/28/22 130/70  01/18/22 (!) 142/74  12/29/21 128/80    Wt Readings from Last 3 Encounters:  01/28/22 257 lb (116.6 kg)  01/18/22 255 lb (115.7 kg)  12/29/21 255 lb (115.7 kg)    Physical Exam Constitutional:      General: He is not in acute distress.    Appearance: He is well-developed. He is obese.     Comments: NAD  Eyes:     Conjunctiva/sclera: Conjunctivae normal.     Pupils: Pupils are equal, round, and reactive to light.  Neck:     Thyroid: No thyromegaly.     Vascular: No JVD.  Cardiovascular:  Rate and Rhythm: Normal rate and regular rhythm.     Heart sounds: Normal heart sounds. No murmur heard.    No friction rub. No gallop.  Pulmonary:     Effort: Pulmonary effort is normal. No respiratory distress.     Breath sounds: Normal breath sounds. No wheezing or rales.  Chest:     Chest wall: No tenderness.  Abdominal:     General: Bowel sounds are normal. There is no distension.     Palpations: Abdomen is soft. There is no mass.     Tenderness: There is no abdominal tenderness. There is no guarding or rebound.  Musculoskeletal:        General: No tenderness. Normal range of motion.     Cervical back: Normal range of motion.  Lymphadenopathy:     Cervical: No cervical adenopathy.  Skin:    General:  Skin is warm and dry.     Findings: No rash.  Neurological:     Mental Status: He is alert and oriented to person, place, and time.     Cranial Nerves: No cranial nerve deficit.     Motor: No abnormal muscle tone.     Coordination: Coordination normal.     Gait: Gait normal.     Deep Tendon Reflexes: Reflexes are normal and symmetric.  Psychiatric:        Behavior: Behavior normal.        Thought Content: Thought content normal.        Judgment: Judgment normal.    I spent 22 minutes in addition to time for CPX wellness examination in preparing to see the patient by review of recent labs, imaging and procedures, obtaining and reviewing separately obtained history, communicating with the patient, ordering medications, tests or procedures, and documenting clinical information in the EHR including the differential diagnosis, treatment, and any further evaluation and other management of CAD, HTN, elevated PSA         Lab Results  Component Value Date   WBC 4.6 01/28/2022   HGB 12.6 (L) 01/28/2022   HCT 38.7 (L) 01/28/2022   PLT 142.0 (L) 01/28/2022   GLUCOSE 121 (H) 01/28/2022   CHOL 163 01/28/2022   TRIG 104.0 01/28/2022   HDL 33.20 (L) 01/28/2022   LDLDIRECT 199.5 07/25/2012   LDLCALC 109 (H) 01/28/2022   ALT 9 01/28/2022   AST 13 01/28/2022   NA 137 01/28/2022   K 4.1 01/28/2022   CL 102 01/28/2022   CREATININE 1.04 01/28/2022   BUN 15 01/28/2022   CO2 28 01/28/2022   TSH 5.22 01/28/2022   PSA 4.76 (H) 01/28/2022   INR 1.1 08/16/2016   HGBA1C 6.6 (H) 01/28/2022    ECHOCARDIOGRAM COMPLETE  Result Date: 09/30/2020    ECHOCARDIOGRAM REPORT   Patient Name:   BENJAMEN KOELLING Date of Exam: 09/30/2020 Medical Rec #:  [id number]       Height:       69.0 in Accession #:    [id number]      Weight:       245.1 lb Date of Birth:  September 22, 1950        BSA:          2.253 m Patient Age:    67 years        BP:           162/65 mmHg Patient Gender: M               HR:  38 bpm. Exam  Location:  Inpatient Procedure: 2D Echo, 3D Echo, Cardiac Doppler and Color Doppler                          STAT ECHO  Findings communicated with Oda Kilts by phone at 2:26 PM. Indications:    I44.2 Complete heart block  History:        Patient has no prior history of Echocardiogram examinations.                 CAD, Abnormal ECG and Bradycardia, Signs/Symptoms:Syncope; Risk                 Factors:Hypertension and Diabetes.  Sonographer:    Roseanna Rainbow RDCS Referring Phys: [id number] [place]  Sonographer Comments: Technically difficult study due to poor echo windows and suboptimal parasternal window. IMPRESSIONS  1. Left ventricular ejection fraction, by estimation, is 60 to 65%. The left ventricle has normal function. The left ventricle has no regional wall motion abnormalities. There is mild concentric left ventricular hypertrophy. Left ventricular diastolic parameters were normal.  2. Right ventricular systolic function is mildly reduced. The right ventricular size is mildly enlarged.  3. The mitral valve is normal in structure. Trivial mitral valve regurgitation. No evidence of mitral stenosis.  4. The aortic valve is tricuspid. Aortic valve regurgitation is not visualized. No aortic stenosis is present.  5. The inferior vena cava is normal in size with <50% respiratory variability, suggesting right atrial pressure of 8 mmHg. Comparison(s): No prior Echocardiogram. Conclusion(s)/Recommendation(s): Normal biventricular function without evidence of hemodynamically significant valvular heart disease. Bradycardic throughout study. FINDINGS  Left Ventricle: Left ventricular ejection fraction, by estimation, is 60 to 65%. The left ventricle has normal function. The left ventricle has no regional wall motion abnormalities. The left ventricular internal cavity size was normal in size. There is  mild concentric left ventricular hypertrophy. Left ventricular diastolic parameters were normal. Right  Ventricle: The right ventricular size is mildly enlarged. Right vetricular wall thickness was not well visualized. Right ventricular systolic function is mildly reduced. Left Atrium: Left atrial size was normal in size. Right Atrium: Right atrial size was normal in size. Pericardium: There is no evidence of pericardial effusion. Mitral Valve: The mitral valve is normal in structure. Trivial mitral valve regurgitation. No evidence of mitral valve stenosis. Tricuspid Valve: The tricuspid valve is normal in structure. Tricuspid valve regurgitation is not demonstrated. No evidence of tricuspid stenosis. Aortic Valve: The aortic valve is tricuspid. Aortic valve regurgitation is not visualized. No aortic stenosis is present. Pulmonic Valve: The pulmonic valve was not well visualized. Pulmonic valve regurgitation is not visualized. Aorta: The aortic root, ascending aorta, aortic arch and descending aorta are all structurally normal, with no evidence of dilitation or obstruction. Venous: The inferior vena cava is normal in size with less than 50% respiratory variability, suggesting right atrial pressure of 8 mmHg. IAS/Shunts: The atrial septum is grossly normal.  LEFT VENTRICLE PLAX 2D LVIDd:         5.20 cm LVIDs:         3.30 cm LV PW:         1.40 cm LV IVS:        1.30 cm LVOT diam:     2.40 cm LV SV:         113 LV SV Index:   50 LVOT Area:     4.52 cm  LV Volumes (MOD) LV vol  d, MOD A2C: 134.0 ml LV vol d, MOD A4C: 149.0 ml LV vol s, MOD A2C: 48.1 ml LV vol s, MOD A4C: 59.2 ml LV SV MOD A2C:     85.9 ml LV SV MOD A4C:     149.0 ml LV SV MOD BP:      91.5 ml RIGHT VENTRICLE             IVC RV S prime:     15.10 cm/s  IVC diam: 1.80 cm TAPSE (M-mode): 1.6 cm LEFT ATRIUM             Index       RIGHT ATRIUM           Index LA diam:        3.60 cm 1.60 cm/m  RA Area:     18.00 cm LA Vol (A2C):   46.7 ml 20.73 ml/m RA Volume:   50.30 ml  22.33 ml/m LA Vol (A4C):   31.0 ml 13.76 ml/m LA Biplane Vol: 39.8 ml 17.67  ml/m  AORTIC VALVE LVOT Vmax:   122.00 cm/s LVOT Vmean:  93.600 cm/s LVOT VTI:    0.249 m  AORTA Ao Root diam: 3.80 cm Ao Asc diam:  3.40 cm MITRAL VALVE MV Area (PHT): 4.15 cm     SHUNTS MV Decel Time: 183 msec     Systemic VTI:  0.25 m MV E velocity: 141.00 cm/s  Systemic Diam: 2.40 cm MV A velocity: 96.80 cm/s MV E/A ratio:  1.46 Buford Dresser MD Electronically signed by Buford Dresser MD Signature Date/Time: 09/30/2020/2:27:08 PM    Final    DG Chest Portable 1 View  Result Date: 09/29/2020 CLINICAL DATA:  Near syncope, dizziness EXAM: PORTABLE CHEST 1 VIEW COMPARISON:  None. FINDINGS: Normal heart size. Normal mediastinal contour. No pneumothorax. No pleural effusion. Lungs appear clear, with no acute consolidative airspace disease and no pulmonary edema. IMPRESSION: No active disease. Electronically Signed   By: Ilona Sorrel M.D.   On: 09/29/2020 11:21    Assessment & Plan:   Problem List Items Addressed This Visit     CAD S/P percutaneous coronary angioplasty - 2015 PCI to RCA (2.5 x 14 Resolute DES) & Cx (3 x 12 Resolute DES) (Chronic)   Relevant Medications   amLODipine (NORVASC) 10 MG tablet   carvedilol (COREG) 25 MG tablet   isosorbide mononitrate (IMDUR) 60 MG 24 hr tablet   nitroGLYCERIN (NITROSTAT) 0.4 MG SL tablet   ranolazine (RANEXA) 500 MG 12 hr tablet   rosuvastatin (CRESTOR) 40 MG tablet   spironolactone (ALDACTONE) 25 MG tablet   Elevated PSA    Monitor PSA.  Will refer to urology if needed.      Relevant Orders   PSA (Completed)   Essential hypertension (Chronic)    Diet discussed Do 5000 steps a day      Relevant Medications   amLODipine (NORVASC) 10 MG tablet   carvedilol (COREG) 25 MG tablet   isosorbide mononitrate (IMDUR) 60 MG 24 hr tablet   nitroGLYCERIN (NITROSTAT) 0.4 MG SL tablet   ranolazine (RANEXA) 500 MG 12 hr tablet   rosuvastatin (CRESTOR) 40 MG tablet   spironolactone (ALDACTONE) 25 MG tablet   Hyperglycemia    Weight  loss, physical activity discussed.  Obtain hemoglobin A1c      Relevant Orders   Hemoglobin A1c (Completed)   Obesity    Wt Readings from Last 3 Encounters:  01/28/22 257 lb (116.6 kg)  01/18/22 255  lb (115.7 kg)  12/29/21 255 lb (115.7 kg)  Diet discussed Do 5000 steps a day      Well adult exam - Primary    We discussed age appropriate health related issues, including available/recomended screening tests and vaccinations. Labs were ordered to be later reviewed . All questions were answered. We discussed one or more of the following - seat belt use, use of sunscreen/sun exposure exercise, safe sex, fall risk reduction, second hand smoke exposure, firearm use and storage, seat belt use, a need for adhering to healthy diet and exercise. Diet discussed Do 5000 steps a day Labs were ordered.  All questions were answered. Colon - ordered 2022 polyps); due in 3-5 years Pt declined shots      Relevant Orders   TSH (Completed)   Urinalysis (Completed)   CBC with Differential/Platelet (Completed)   Lipid panel (Completed)   PSA (Completed)   Comprehensive metabolic panel (Completed)   Hemoglobin A1c (Completed)    Pt declined rectal - he had a recent colonoscopy  Meds ordered this encounter  Medications   amLODipine (NORVASC) 10 MG tablet    Sig: TAKE 1 TABLET BY MOUTH  DAILY    Dispense:  90 tablet    Refill:  3   carvedilol (COREG) 25 MG tablet    Sig: Take 1 tablet (25 mg total) by mouth 2 (two) times daily with a meal.    Dispense:  180 tablet    Refill:  3   cetirizine (ZYRTEC) 10 MG tablet    Sig: Take 1 tablet (10 mg total) by mouth daily as needed for allergies.    Dispense:  90 tablet    Refill:  3   isosorbide mononitrate (IMDUR) 60 MG 24 hr tablet    Sig: Take 1 tablet (60 mg total) by mouth daily.    Dispense:  90 tablet    Refill:  3   nitroGLYCERIN (NITROSTAT) 0.4 MG SL tablet    Sig: DISSOLVE 1 TABLET UNDER THE TONGUE EVERY 5 MINUTES AS NEEDED FOR CHEST  PAIN. MAX 3 TABLETS IN 15 MINUTES. CALL 911 IF PAIN PERSISTS.    Dispense:  45 tablet    Refill:  3    Requesting 1 year supply   ranolazine (RANEXA) 500 MG 12 hr tablet    Sig: TAKE 2 TABLETS BY MOUTH TWICE  DAILY    Dispense:  360 tablet    Refill:  3    Requesting 1 year supply   rosuvastatin (CRESTOR) 40 MG tablet    Sig: Take 1 tablet (40 mg total) by mouth daily.    Dispense:  90 tablet    Refill:  3   spironolactone (ALDACTONE) 25 MG tablet    Sig: Take 0.5 tablets (12.5 mg total) by mouth daily.    Dispense:  45 tablet    Refill:  3      Follow-up: Return in about 6 months (around 07/30/2022) for a follow-up visit.  Walker Kehr, MD

## 2022-01-28 NOTE — Assessment & Plan Note (Signed)
Wt Readings from Last 3 Encounters:  01/28/22 257 lb (116.6 kg)  01/18/22 255 lb (115.7 kg)  12/29/21 255 lb (115.7 kg)   Diet discussed Do 5000 steps a day

## 2022-01-28 NOTE — Assessment & Plan Note (Addendum)
We discussed age appropriate health related issues, including available/recomended screening tests and vaccinations. Labs were ordered to be later reviewed . All questions were answered. We discussed one or more of the following - seat belt use, use of sunscreen/sun exposure exercise, safe sex, fall risk reduction, second hand smoke exposure, firearm use and storage, seat belt use, a need for adhering to healthy diet and exercise. Diet discussed Do 5000 steps a day Labs were ordered.  All questions were answered. Colon - ordered 2022 polyps); due in 3-5 years Pt declined shots

## 2022-01-28 NOTE — Assessment & Plan Note (Addendum)
Monitor PSA.  Will refer to urology if needed.

## 2022-02-14 NOTE — Assessment & Plan Note (Signed)
Weight loss, physical activity discussed.  Obtain hemoglobin A1c

## 2022-02-19 ENCOUNTER — Other Ambulatory Visit: Payer: Self-pay

## 2022-02-19 MED ORDER — NITROGLYCERIN 0.4 MG SL SUBL: SUBLINGUAL_TABLET | SUBLINGUAL | 3 refills | Status: DC

## 2022-03-26 NOTE — Progress Notes (Unsigned)
Cardiology Office Note:    Date:  03/31/2022   ID:  Anthony Chapman, DOB Apr 07, 1951, MRN [id number]  PCP:  Cassandria Anger, MD   [facility] Providers Cardiologist:  Rachyl Wuebker [place], MD Electrophysiologist:  [name], MD     Referring MD: Cassandria Anger, MD   Chief Complaint  Patient presents with   Coronary Artery Disease    History of Present Illness:    Anthony Chapman is a 71 y.o. male with a hx of CAD (s/p DES to RCA in 2010, DES to RCA and LCx in 2015, DES to LCx 08/2016), HTN, HLD, prediabetes and chronic LBBB. Cardiac catheterization performed in January 2018 showed 100% ISR of distal RCA with left-to-right collaterals, 10% ostial LAD in-stent restenosis, 90% mid left circumflex stenosis treated with Resolute DES.  He developed recurrent angina in October 2021 and underwent a repeat cath on 05/20/2020 that revealed 100% RPLA occlusion with distal collateralization, 50% ramus lesion, 10% ostial to distal left circumflex lesion, 30% proximal RCA, 40% distal RCA lesion, EF 55 to 65%. Medical therapy was recommended.    He developed symptomatic bradycardia in February 2022 and required implantation of [place] MRI dual-chamber pacemaker. Echocardiogram obtained on 09/30/2020 showed EF 60 to 65%, trivial MR.   He is not device dependent.  Blood pressure was elevated, spironolactone 12.5 mg daily was added to his medical regimen. Last pacer check in November was normal. On his last visit he was started on Ranexa. No chest pain since.   Saw EP in June. Pacer function ok.   On follow up today he reports he is doing well. He still has angina if he rushes too much but reports this is infrequent. Improved significantly on Ranexa. Uses sl Ntg prn. Recently has experienced right buttocks and leg pain typically at rest.  He stays active.    Past Medical History:  Diagnosis Date   Allergy    Spring- mild    Anginal pain ([place])    Arthritis    "all over"  (08/27/2016)   CAD (coronary artery disease)    a.  stent placement to the RCA in 2010 b. cath in 2015 with PCI to the RCA (2.44m x159mResolute DES) and LCx (3.23m46m27m68msolute DES).  c. 08/2016: cath showing 100% ISR of d-RCA with collaterals, 10% Ost LAD ISR, 90% mid-Cx stenosis (3.0x18mm93molute DES placed)   ED (erectile dysfunction)    GERD (gastroesophageal reflux disease)    HTN (hypertension)    Hyperlipemia    LBBB (left bundle branch block)    Myocardial infarction (HCC) [place]0; 2015   Sleep apnea    wears cpap     Past Surgical History:  Procedure Laterality Date   CARDIAC CATHETERIZATION  09/2013   CARDIAC CATHETERIZATION N/A 08/27/2016   Procedure: Left Heart Cath and Coronary Angiography;  Surgeon: DavidLeonie Man  [facility];  Service: Cardiovascular;  Laterality: N/A;   CARDIAC CATHETERIZATION N/A 08/27/2016   Procedure: Coronary Stent Intervention;  Surgeon: DavidLeonie Man  [facility];  Service: Cardiovascular;  Laterality: N/A;   COLONOSCOPY  2004   CORONARY ANGIOPLASTY WITH STENT PLACEMENT  2010; 1/192018   HERNIA REPAIR     INGUINAL HERNIA REPAIR Left    LEFT HEART CATH AND CORONARY ANGIOGRAPHY N/A 05/20/2020   Procedure: LEFT HEART CATH AND CORONARY ANGIOGRAPHY;  Surgeon: JordaMartiniqueer M, MD;  [facility];  Service:  Cardiovascular;  Laterality: N/A;   PACEMAKER IMPLANT N/A 10/01/2020   Procedure: PACEMAKER IMPLANT;  Surgeon: [name], MD;  Location: [facility];  Service: Cardiovascular;  Laterality: N/A;   UMBILICAL HERNIA REPAIR      Current Medications: Current Meds  Medication Sig   amLODipine (NORVASC) 10 MG tablet TAKE 1 TABLET BY MOUTH  DAILY   aspirin 81 MG EC tablet Take 1 tablet (81 mg total) by mouth daily.   carvedilol (COREG) 25 MG tablet Take 1 tablet (25 mg total) by mouth 2 (two) times daily with a meal.   cetirizine (ZYRTEC) 10 MG tablet Take 1 tablet (10 mg total) by  mouth daily as needed for allergies.   ezetimibe (ZETIA) 10 MG tablet Take 1 tablet (10 mg total) by mouth daily.   isosorbide mononitrate (IMDUR) 60 MG 24 hr tablet Take 1 tablet (60 mg total) by mouth daily.   montelukast (SINGULAIR) 10 MG tablet Take 10 mg by mouth daily as needed (allergies).   nitroGLYCERIN (NITROSTAT) 0.4 MG SL tablet DISSOLVE 1 TABLET UNDER THE TONGUE EVERY 5 MINUTES AS NEEDED FOR CHEST PAIN. MAX 3 TABLETS IN 15 MINUTES. CALL 911 IF PAIN PERSISTS.   ranolazine (RANEXA) 500 MG 12 hr tablet TAKE 2 TABLETS BY MOUTH TWICE  DAILY   rosuvastatin (CRESTOR) 40 MG tablet Take 1 tablet (40 mg total) by mouth daily.   spironolactone (ALDACTONE) 25 MG tablet Take 0.5 tablets (12.5 mg total) by mouth daily.   VITAMIN D, CHOLECALCIFEROL, PO Take 1 tablet by mouth daily as needed (vitamin d supplement).     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         Social Determinants of Health   Financial Resource Strain: Not on file  Food Insecurity: Not on file  Transportation Needs: Not on file  Physical Activity: Not on file  Stress: Not on file  Social Connections: Not on file     Family History: The patient's family history includes Arthritis in his father; CAD in his maternal aunt and mother;  Hypertension in his father, mother, and another family member. There is no history of Colon cancer, Colon polyps, Esophageal cancer, Rectal cancer, or Stomach cancer.  ROS:   Please see the history of present illness.     All other systems reviewed and are negative.  EKGs/Labs/Other Studies Reviewed:    The following studies were reviewed today:  Cath 05/20/2020 Prox RCA lesion is 30% stenosed. Dist RCA lesion is 40% stenosed. RPAV lesion is 100% stenosed. Prox LAD to Mid LAD lesion is 25% stenosed. Ramus lesion is 50%  stenosed. Ost Cx to Dist Cx lesion is 10% stenosed. The left ventricular systolic function is normal. LV end diastolic pressure is normal. The left ventricular ejection fraction is 55-65% by visual estimate.   1. Single vessel occlusive CAD involving the PL branch of the RCA. This is well collateralized. 2. Patent stents in the LCx 3. Good LV function 4. Normal LVEDP   Plan: continue medical therapy   Echo 09/30/2020 1. Left ventricular ejection fraction, by estimation, is 60 to 65%. The  left ventricle has normal function. The left ventricle has no regional  wall motion abnormalities. There is mild concentric left ventricular  hypertrophy. Left ventricular diastolic  parameters were normal.   2. Right ventricular systolic function is mildly reduced. The right  ventricular size is mildly enlarged.   3. The mitral valve is normal in structure. Trivial mitral valve  regurgitation. No evidence of mitral stenosis.   4. The aortic valve is tricuspid. Aortic valve regurgitation is not  visualized. No aortic stenosis is present.   5. The inferior vena cava is normal in size with <50% respiratory  variability, suggesting right atrial pressure of 8 mmHg.   Comparison(s): No prior Echocardiogram.   EKG:  EKG is not ordered today.    Recent Labs: 01/28/2022: ALT 9; BUN 15; Creatinine, Ser 1.04; Hemoglobin 12.6; Platelets 142.0; Potassium 4.1; Sodium 137; TSH 5.22   Recent Lipid Panel    Component Value Date/Time   CHOL 163 01/28/2022 1122   CHOL 196 09/04/2021 1503   TRIG 104.0 01/28/2022 1122   HDL 33.20 (L) 01/28/2022 1122   HDL 33 (L) 09/04/2021 1503   CHOLHDL 5 01/28/2022 1122   VLDL 20.8 01/28/2022 1122   LDLCALC 109 (H) 01/28/2022 1122   LDLCALC 131 (H) 09/04/2021 1503   LDLDIRECT 199.5 07/25/2012 0935      Physical Exam:    VS:  BP 130/78   Pulse 83   Ht '5\' 9"'$  (1.753 m)   Wt 256 lb 9.6 oz (116.4 kg)   SpO2 97%   BMI 37.89 kg/m     Wt Readings from Last 3 Encounters:  03/31/22 256 lb 9.6 oz (116.4 kg)  01/28/22 257 lb (116.6 kg)  01/18/22 255 lb (115.7 kg)     GEN:  Well nourished, obese, in no acute distress HEENT: Normal NECK: No JVD; No carotid bruits LYMPHATICS: No lymphadenopathy CARDIAC: RRR, no murmurs, rubs, gallops RESPIRATORY:  Clear to auscultation without rales, wheezing or rhonchi  ABDOMEN: Soft, non-tender, non-distended MUSCULOSKELETAL:  No edema; No deformity  SKIN: Warm and dry. Pedal pulses 1+ NEUROLOGIC:  Alert and oriented x 3 PSYCHIATRIC:  Normal affect   ASSESSMENT:    1. Coronary artery disease involving native coronary artery of native heart with angina pectoris ([place])   2. Cardiac pacemaker in situ   3. Heart block AV complete ([place])   4. Primary hypertension   5. Hyperlipidemia LDL goal <70   6. Right leg pain      PLAN:    In order of problems listed above:  CAD: Last cardiac catheterization in October 2021.  Occluded PL branch of the RCA. Well collateralized. Other stents patent. Angina improved on Ranexa, Imdur, amlodipine. Continue current therapy  Hypertension: Blood pressure is well controlled.   Hyperlipidemia: goal LDL < 70. Last LDL 109 on maximal Crestor dose. Will add Zetia 10 mg daily. Repeat lab in 3 months.   Prediabetes: A1c 6.6% - per PCP  Bradycardia s/p pacemaker: Followed by EP service.  Last seen in June and pacemaker function OK  6.   Right buttocks and leg  pain. Multiple risk factors for vascular disease. Will check LE arterial dopplers.  Follow up in  6 months        Medication Adjustments/Labs and Tests Ordered: Current medicines are reviewed at length with the patient today.  Concerns regarding medicines are outlined above.  No orders of the defined types were placed in this encounter.  Meds ordered this encounter  Medications   ezetimibe (ZETIA) 10 MG tablet    Sig: Take 1 tablet (10 mg total) by mouth daily.    Dispense:  90 tablet    Refill:  3    Patient Instructions  We will add Zetia 10 mg daily for your cholesterol   Repeat lab work in 3 months  We will do a doppler study to check the circulation in your legs.      Signed, Christhoper Busbee Martin[place], MD  03/31/2022 9:47 AM    [facility]

## 2022-03-31 ENCOUNTER — Ambulatory Visit (INDEPENDENT_AMBULATORY_CARE_PROVIDER_SITE_OTHER): Payer: Medicare Other | Admitting: Cardiology

## 2022-03-31 ENCOUNTER — Encounter: Payer: Self-pay | Admitting: Cardiology

## 2022-03-31 VITALS — BP 130/78 | HR 83 | Ht 69.0 in | Wt 256.6 lb

## 2022-03-31 DIAGNOSIS — I10 Essential (primary) hypertension: Secondary | ICD-10-CM

## 2022-03-31 DIAGNOSIS — I25119 Atherosclerotic heart disease of native coronary artery with unspecified angina pectoris: Secondary | ICD-10-CM

## 2022-03-31 DIAGNOSIS — E785 Hyperlipidemia, unspecified: Secondary | ICD-10-CM

## 2022-03-31 DIAGNOSIS — Z950 Presence of cardiac pacemaker: Secondary | ICD-10-CM

## 2022-03-31 DIAGNOSIS — I442 Atrioventricular block, complete: Secondary | ICD-10-CM | POA: Diagnosis not present

## 2022-03-31 DIAGNOSIS — M79604 Pain in right leg: Secondary | ICD-10-CM

## 2022-03-31 MED ORDER — EZETIMIBE 10 MG PO TABS: 10.0000 mg | ORAL_TABLET | Freq: Every day | ORAL | 3 refills | Status: DC

## 2022-03-31 NOTE — Patient Instructions (Addendum)
We will add Zetia 10 mg daily for your cholesterol   Repeat lab work in 3 months  We will do a doppler study to check the circulation in your legs.   Schedule Lower Ext Arterial Dopplers

## 2022-04-01 ENCOUNTER — Telehealth: Payer: Self-pay

## 2022-04-01 NOTE — Telephone Encounter (Signed)
Called patient left message on personal voice mail [employer] disability forms completed.I will leave at [facility] office front desk for pick up.

## 2022-04-02 ENCOUNTER — Other Ambulatory Visit: Payer: Self-pay | Admitting: Cardiology

## 2022-04-02 DIAGNOSIS — M79604 Pain in right leg: Secondary | ICD-10-CM

## 2022-04-06 ENCOUNTER — Ambulatory Visit (INDEPENDENT_AMBULATORY_CARE_PROVIDER_SITE_OTHER): Payer: Medicare Other

## 2022-04-06 DIAGNOSIS — I442 Atrioventricular block, complete: Secondary | ICD-10-CM | POA: Diagnosis not present

## 2022-04-06 LAB — CUP PACEART REMOTE DEVICE CHECK
Battery Remaining Longevity: 111 mo
Battery Remaining Percentage: 90 %
Battery Voltage: 3.02 V
Brady Statistic AP VP Percent: 1 %
Brady Statistic AP VS Percent: 5.4 %
Brady Statistic AS VP Percent: 1 %
Brady Statistic AS VS Percent: 95 %
Brady Statistic RA Percent Paced: 5.1 %
Brady Statistic RV Percent Paced: 1 %
Date Time Interrogation Session: 20230829020028
Implantable Lead Implant Date: 20220223
Implantable Lead Implant Date: 20220223
Implantable Lead Location: 753859
Implantable Lead Location: 753860
Implantable Pulse Generator Implant Date: 20220223
Lead Channel Impedance Value: 440 Ohm
Lead Channel Impedance Value: 530 Ohm
Lead Channel Pacing Threshold Amplitude: 0.75 V
Lead Channel Pacing Threshold Amplitude: 0.75 V
Lead Channel Pacing Threshold Pulse Width: 0.5 ms
Lead Channel Pacing Threshold Pulse Width: 0.5 ms
Lead Channel Sensing Intrinsic Amplitude: 12 mV
Lead Channel Sensing Intrinsic Amplitude: 2.5 mV
Lead Channel Setting Pacing Amplitude: 2 V
Lead Channel Setting Pacing Amplitude: 2.5 V
Lead Channel Setting Pacing Pulse Width: 0.5 ms
Lead Channel Setting Sensing Sensitivity: 2 mV
Pulse Gen Model: 2272
Pulse Gen Serial Number: 3900218

## 2022-04-10 LAB — LIPID PANEL
Chol/HDL Ratio: 5.3 ratio — ABNORMAL HIGH (ref 0.0–5.0)
Cholesterol, Total: 184 mg/dL (ref 100–199)
HDL: 35 mg/dL — ABNORMAL LOW (ref >39)
LDL Chol Calc (NIH): 130 mg/dL — ABNORMAL HIGH (ref 0–99)
Triglycerides: 105 mg/dL (ref 0–149)
VLDL Cholesterol Cal: 19 mg/dL (ref 5–40)

## 2022-04-10 LAB — HEPATIC FUNCTION PANEL
ALT: 10 IU/L (ref 0–44)
AST: 15 IU/L (ref 0–40)
Albumin: 4.1 g/dL (ref 3.9–4.9)
Alkaline Phosphatase: 112 IU/L (ref 44–121)
Bilirubin Total: 0.5 mg/dL (ref 0.0–1.2)
Bilirubin, Direct: 0.14 mg/dL (ref 0.00–0.40)
Total Protein: 7.7 g/dL (ref 6.0–8.5)

## 2022-04-13 ENCOUNTER — Ambulatory Visit (HOSPITAL_COMMUNITY)
Admission: RE | Admit: 2022-04-13 | Discharge: 2022-04-13 | Disposition: A | Discharge status: Home / Self Care | Payer: Medicare Other | Source: Ambulatory Visit | Attending: Cardiology | Admitting: Cardiology

## 2022-04-13 ENCOUNTER — Other Ambulatory Visit: Payer: Self-pay

## 2022-04-13 DIAGNOSIS — E785 Hyperlipidemia, unspecified: Secondary | ICD-10-CM | POA: Diagnosis present

## 2022-04-13 DIAGNOSIS — M79604 Pain in right leg: Secondary | ICD-10-CM | POA: Diagnosis not present

## 2022-04-13 DIAGNOSIS — I10 Essential (primary) hypertension: Secondary | ICD-10-CM | POA: Insufficient documentation

## 2022-04-13 DIAGNOSIS — I25119 Atherosclerotic heart disease of native coronary artery with unspecified angina pectoris: Secondary | ICD-10-CM | POA: Insufficient documentation

## 2022-04-13 DIAGNOSIS — Z950 Presence of cardiac pacemaker: Secondary | ICD-10-CM | POA: Insufficient documentation

## 2022-04-13 DIAGNOSIS — I442 Atrioventricular block, complete: Secondary | ICD-10-CM | POA: Diagnosis present

## 2022-04-14 ENCOUNTER — Other Ambulatory Visit: Payer: Self-pay

## 2022-04-14 ENCOUNTER — Ambulatory Visit: Payer: Medicare Other | Admitting: Primary Care

## 2022-04-14 DIAGNOSIS — I25119 Atherosclerotic heart disease of native coronary artery with unspecified angina pectoris: Secondary | ICD-10-CM

## 2022-04-14 DIAGNOSIS — E785 Hyperlipidemia, unspecified: Secondary | ICD-10-CM

## 2022-04-14 NOTE — Progress Notes (Deleted)
$'@Patient'T$  ID: Anthony Chapman, male    DOB: 05-Dec-1950, 71 y.o.   MRN: [id number]  No chief complaint on file.   Referring provider: Plotnikov, Anthony Lacks, MD  HPI: 71 year old male, some day smoker. PMH significant for CAD, HTN, unstable angina, OSA, hyperlipidemia, elevated PSA.   Previous [facility] pulmonary encounter: 12/29/2021 Patient presents today for sleep consult. Hx OSA. Self referred. Accompanied by his wife. He was previous seen by Dr. Corrie Chapman back in Febuary 2018, he had sleep study in February 2018 that showed moderate OSA with average AHI 16/hr. He was maintained on auto CPAP 5-15 cm h20. SD card brought today for compliance check. He is 20% complaint with use. His current CPAP machine is working. He is no longer getting CPAP supplies. At times dif tolerating mask but overall he does ok with CPAP and reports benefit from use. He would like to change DME companies to [employer].   Airview download 09/29/21-12/27/21 Usage 19/90 days (21%); 6 days > 4 hours Average usage days used 3 hours 42 mins Pressure 5-15cm h20 (11.8cm h20-95%) Airleaks 16.4L/min (95%) AHI 1.4  OSA (obstructive sleep apnea) - HST 09/2016 showed moderate OSA, average AHI 16/hr. Compliance with CPAP is fair. Needs CPAP supplies renewed. Current pressure 5-15cm h20; Residual AHI 1.4/hr. No pressure changes needed today, we will renew CPAP supplies and change DME company to [employer]. May need repeat HST. Advised patient to aim to wear CPAP every night 4-6 hours or longer. No driving if experiencing excessive daytime sleepiness. Reviewed risk of untreated sleep apnea. FU 3 months       04/14/2022- interim hx  Patient presents today for OSA follow-up.   Hst in 09/2016 showed moderate OSA, average AHI 16/hour. Compliance with CPAP was previously been fair.     Allergies  Allergen Reactions   Losartan Other (See Comments)    angioedema    Immunization History  Administered Date(s) Administered   PFIZER Comirnaty([place])Covid-19 Tri-Sucrose Vaccine 02/01/2020, 03/02/2020, 08/06/2020   Tdap 07/25/2012    Past Medical History:  Diagnosis Date   Allergy    Spring- mild    Anginal pain ([place])    Arthritis    "all over" (08/27/2016)   CAD (coronary artery disease)    a.  stent placement to the RCA in 2010 b. cath in 2015 with PCI to the RCA (2.55m x152mResolute DES) and LCx (3.38m66m81m95msolute DES).  c. 08/2016: cath showing 100% ISR of d-RCA with collaterals, 10% Ost LAD ISR, 90% mid-Cx stenosis (3.0x18mm338molute DES placed)   ED (erectile dysfunction)    GERD (gastroesophageal reflux disease)    HTN (hypertension)    Hyperlipemia    LBBB (left bundle branch block)    Myocardial infarction (HCC) [place]0; 2015   Sleep apnea    wears cpap     Tobacco History: Social History   Tobacco Use  Smoking Status Former   Packs/day: 1.00   Years: 30.00   Total pack years: 30.00   Types: Cigarettes, Cigars   Quit date: 08/16/1998   Years since quitting: 23.6  Smokeless Tobacco Never   Counseling given: Not Answered   Outpatient Medications Prior to Visit  Medication Sig Dispense Refill   amLODipine (NORVASC) 10 MG tablet TAKE 1 TABLET BY MOUTH  DAILY 90 tablet 3   aspirin 81 MG EC tablet Take 1 tablet (81 mg total) by mouth daily. 30 tablet 0   carvedilol (COREG) 25 MG tablet Take 1 tablet (  25 mg total) by mouth 2 (two) times daily with a meal. 180 tablet 3   cetirizine (ZYRTEC) 10 MG tablet Take 1 tablet (10 mg total) by mouth daily as needed for allergies. 90 tablet 3   ezetimibe (ZETIA) 10 MG tablet Take 1 tablet (10 mg total) by mouth daily. 90 tablet 3   isosorbide mononitrate (IMDUR) 60 MG 24 hr tablet Take 1 tablet (60 mg total) by mouth daily. 90 tablet 3   montelukast (SINGULAIR) 10 MG tablet Take 10 mg by mouth daily as needed (allergies).     nitroGLYCERIN (NITROSTAT) 0.4 MG SL tablet DISSOLVE 1 TABLET UNDER THE TONGUE EVERY 5 MINUTES AS NEEDED FOR CHEST PAIN. MAX 3 TABLETS IN 15 MINUTES.  CALL 911 IF PAIN PERSISTS. 45 tablet 3   ranolazine (RANEXA) 500 MG 12 hr tablet TAKE 2 TABLETS BY MOUTH TWICE  DAILY 360 tablet 3   rosuvastatin (CRESTOR) 40 MG tablet Take 1 tablet (40 mg total) by mouth daily. 90 tablet 3   spironolactone (ALDACTONE) 25 MG tablet Take 0.5 tablets (12.5 mg total) by mouth daily. 45 tablet 3   VITAMIN D, CHOLECALCIFEROL, PO Take 1 tablet by mouth daily as needed (vitamin d supplement).     No facility-administered medications prior to visit.      Review of Systems  Review of Systems   Physical Exam  There were no vitals taken for this visit. Physical Exam   Lab Results:  CBC    Component Value Date/Time   WBC 4.6 01/28/2022 1122   RBC 4.67 01/28/2022 1122   HGB 12.6 (L) 01/28/2022 1122   HGB 13.1 05/19/2020 1253   HCT 38.7 (L) 01/28/2022 1122   HCT 39.9 05/19/2020 1253   PLT 142.0 (L) 01/28/2022 1122   PLT 159 05/19/2020 1253   MCV 83.0 01/28/2022 1122   MCV 80 05/19/2020 1253   MCH 26.4 10/01/2020 0035   MCHC 32.5 01/28/2022 1122   RDW 15.0 01/28/2022 1122   RDW 13.7 05/19/2020 1253   LYMPHSABS 1.0 01/28/2022 1122   LYMPHSABS 1.5 09/07/2019 1648   MONOABS 0.6 01/28/2022 1122   EOSABS 0.3 01/28/2022 1122   EOSABS 0.4 09/07/2019 1648   BASOSABS 0.0 01/28/2022 1122   BASOSABS 0.1 09/07/2019 1648    BMET    Component Value Date/Time   NA 137 01/28/2022 1122   NA 137 09/04/2021 1503   K 4.1 01/28/2022 1122   CL 102 01/28/2022 1122   CO2 28 01/28/2022 1122   GLUCOSE 121 (H) 01/28/2022 1122   BUN 15 01/28/2022 1122   BUN 14 09/04/2021 1503   CREATININE 1.04 01/28/2022 1122   CREATININE 0.96 12/07/2016 1516   CALCIUM 9.0 01/28/2022 1122   GFRNONAA >60 10/01/2020 0035   GFRAA 106 05/19/2020 1253    BNP    Component Value Date/Time   BNP 75.4 10/01/2020 0035    ProBNP No results found for: "PROBNP"  Imaging: VAS [place] LE ART SEG MULTI (Segm&LE Reynauds)  Result Date: 04/13/2022  LOWER EXTREMITY DOPPLER STUDY Patient  Name:  Anthony Chapman  Date of Exam:   04/13/2022 Medical Rec #: [id number]        Accession #:    [id number] Date of Birth: Nov 02, 1950         Patient Gender: M Patient Age:   35 years Exam Location:  [facility] Procedure:      VAS [place] LOWER EXT ART SEG MULTI (SEGMENTALS & LE RAYNAUDS) Referring Phys: PETER Martinique --------------------------------------------------------------------------------  Indications:  Patient reports that over the past month or so has experienced              right buttocks and hip pain, that typically occurs at rest or at              night when laying down. Once he is up and moving it seems to get              better. High Risk Factors: Hypertension, hyperlipidemia, past history of smoking,                    coronary artery disease.  Performing Technologist: Mariane Masters RVT  Examination Guidelines: A complete evaluation includes at minimum, Doppler waveform signals and systolic blood pressure reading at the level of bilateral brachial, anterior tibial, and posterior tibial arteries, when vessel segments are accessible. Bilateral testing is considered an integral part of a complete examination. Photoelectric Plethysmograph (PPG) waveforms and toe systolic pressure readings are included as required and additional duplex testing as needed. Limited examinations for reoccurring indications may be performed as noted.  ABI Findings: +---------+------------------+-----+---------+--------+ Right    Rt Pressure (mmHg)IndexWaveform Comment  +---------+------------------+-----+---------+--------+ Brachial 176                                      +---------+------------------+-----+---------+--------+ CFA                             triphasic         +---------+------------------+-----+---------+--------+ Popliteal                       triphasic         +---------+------------------+-----+---------+--------+ PTA      171               0.97 triphasic          +---------+------------------+-----+---------+--------+ PERO     192               1.09 triphasic         +---------+------------------+-----+---------+--------+ DP       194               1.10 triphasic         +---------+------------------+-----+---------+--------+ Great Toe158               0.90 Normal            +---------+------------------+-----+---------+--------+ +---------+------------------+-----+---------+-------+ Left     Lt Pressure (mmHg)IndexWaveform Comment +---------+------------------+-----+---------+-------+ Brachial 174                                     +---------+------------------+-----+---------+-------+ CFA                             triphasic        +---------+------------------+-----+---------+-------+ Popliteal                       triphasic        +---------+------------------+-----+---------+-------+ PTA      176               1.00 biphasic         +---------+------------------+-----+---------+-------+ PERO     198  1.12 triphasic        +---------+------------------+-----+---------+-------+ DP       185               1.05 triphasic        +---------+------------------+-----+---------+-------+ Great Toe160               0.91 Normal           +---------+------------------+-----+---------+-------+ +-------+-----------+-----------+------------+------------+ ABI/TBIToday's ABIToday's TBIPrevious ABIPrevious TBI +-------+-----------+-----------+------------+------------+ Right  1.10       0.90                                +-------+-----------+-----------+------------+------------+ Left   1.13       0.91                                +-------+-----------+-----------+------------+------------+  Summary: Right: Resting right ankle-brachial index is within normal range. The right toe-brachial index is normal. Left: Resting left ankle-brachial index is within normal range. The left toe-brachial  index is normal. *See table(s) above for measurements and observations.  Electronically signed by Jenkins Rouge MD on 04/13/2022 at 8:31:07 PM.    Final    CUP PACEART REMOTE DEVICE CHECK  Result Date: 04/06/2022 Scheduled remote reviewed. Normal device function.  Next remote 91 days- JJB    Assessment & Plan:   No problem-specific Assessment & Plan notes found for this encounter.     Martyn Ehrich, NP 04/14/2022

## 2022-04-16 ENCOUNTER — Encounter: Payer: Self-pay | Admitting: Primary Care

## 2022-04-16 ENCOUNTER — Ambulatory Visit (INDEPENDENT_AMBULATORY_CARE_PROVIDER_SITE_OTHER): Payer: Medicare Other | Admitting: Primary Care

## 2022-04-16 DIAGNOSIS — G4733 Obstructive sleep apnea (adult) (pediatric): Secondary | ICD-10-CM | POA: Diagnosis not present

## 2022-04-16 NOTE — Patient Instructions (Addendum)
Your compliance with CPAP continues to be fair, we need you to be more consist with use   Risks of untreated sleep apnea include cardiac arrhythmias, pulmonary hypertension, stroke and diabetes  Recommendations - Aim to wear CPAP every night for minimum 4 to 6 hours or longer - Work on weight loss efforts as appropriate - Focus on side sleeping position or elevate head degrees while sleeping with wedge pillow - Look into getting mole by your right nostril removed, if this is not an option or you still are experiencing irritation we may want to change you to a different nasal mask  Follow-up - 3 to 4 months Beth NP for CPAP compliance check  Sleep Apnea Sleep apnea affects breathing during sleep. It causes breathing to stop for 10 seconds or more, or to become shallow. People with sleep apnea usually snore loudly. It can also increase the risk of: Heart attack. Stroke. Being very overweight (obese). Diabetes. Heart failure. Irregular heartbeat. High blood pressure. The goal of treatment is to help you breathe normally again. What are the causes?  The most common cause of this condition is a collapsed or blocked airway. There are three kinds of sleep apnea: Obstructive sleep apnea. This is caused by a blocked or collapsed airway. Central sleep apnea. This happens when the brain does not send the right signals to the muscles that control breathing. Mixed sleep apnea. This is a combination of obstructive and central sleep apnea. What increases the risk? Being overweight. Smoking. Having a small airway. Being older. Being male. Drinking alcohol. Taking medicines to calm yourself (sedatives or tranquilizers). Having family members with the condition. Having a tongue or tonsils that are larger than normal. What are the signs or symptoms? Trouble staying asleep. Loud snoring. Headaches in the morning. Waking up gasping. Dry mouth or sore throat in the morning. Being sleepy or  tired during the day. If you are sleepy or tired during the day, you may also: Not be able to focus your mind (concentrate). Forget things. Get angry a lot and have mood swings. Feel sad (depressed). Have changes in your personality. Have less interest in sex, if you are male. Be unable to have an erection, if you are male. How is this treated?  Sleeping on your side. Using a medicine to get rid of mucus in your nose (decongestant). Avoiding the use of alcohol, medicines to help you relax, or certain pain medicines (narcotics). Losing weight, if needed. Changing your diet. Quitting smoking. Using a machine to open your airway while you sleep, such as: An oral appliance. This is a mouthpiece that shifts your lower jaw forward. A CPAP device. This device blows air through a mask when you breathe out (exhale). An EPAP device. This has valves that you put in each nostril. A BIPAP device. This device blows air through a mask when you breathe in (inhale) and breathe out. Having surgery if other treatments do not work. Follow these instructions at home: Lifestyle Make changes that your doctor recommends. Eat a healthy diet. Lose weight if needed. Avoid alcohol, medicines to help you relax, and some pain medicines. Do not smoke or use any products that contain nicotine or tobacco. If you need help quitting, ask your doctor. General instructions Take over-the-counter and prescription medicines only as told by your doctor. If you were given a machine to use while you sleep, use it only as told by your doctor. If you are having surgery, make sure to tell your doctor  you have sleep apnea. You may need to bring your device with you. Keep all follow-up visits. Contact a doctor if: The machine that you were given to use during sleep bothers you or does not seem to be working. You do not get better. You get worse. Get help right away if: Your chest hurts. You have trouble breathing in  enough air. You have an uncomfortable feeling in your back, arms, or stomach. You have trouble talking. One side of your body feels weak. A part of your face is hanging down. These symptoms may be an emergency. Get help right away. Call your local emergency services (911 in the [place] Do not wait to see if the symptoms will go away. Do not drive yourself to the hospital. Summary This condition affects breathing during sleep. The most common cause is a collapsed or blocked airway. The goal of treatment is to help you breathe normally while you sleep. This information is not intended to replace advice given to you by your health care provider. Make sure you discuss any questions you have with your health care provider. Document Revised: 03/04/2021 Document Reviewed: 07/04/2020 Elsevier Patient Education  [place].  CPAP and BIPAP Information CPAP and BIPAP are methods that use air pressure to keep your airways open and to help you breathe well. CPAP and BIPAP use different amounts of pressure. Your health care provider will tell you whether CPAP or BIPAP would be more helpful for you. CPAP stands for "continuous positive airway pressure." With CPAP, the amount of pressure stays the same while you breathe in (inhale) and out (exhale). BIPAP stands for "bi-level positive airway pressure." With BIPAP, the amount of pressure will be higher when you inhale and lower when you exhale. This allows you to take larger breaths. CPAP or BIPAP may be used in the hospital, or your health care provider may want you to use it at home. You may need to have a sleep study before your health care provider can order a machine for you to use at home. What are the advantages? CPAP or BIPAP can be helpful if you have: Sleep apnea. Chronic obstructive pulmonary disease (COPD). Heart failure. Medical conditions that cause muscle weakness, including muscular dystrophy or amyotrophic lateral sclerosis  (ALS). Other problems that cause breathing to be shallow, weak, abnormal, or difficult. CPAP and BIPAP are most commonly used for obstructive sleep apnea (OSA) to keep the airways from collapsing when the muscles relax during sleep. What are the risks? Generally, this is a safe treatment. However, problems may occur, including: Irritated skin or skin sores if the mask does not fit properly. Dry or stuffy nose or nosebleeds. Dry mouth. Feeling gassy or bloated. Sinus or lung infection if the equipment is not cleaned properly. When should CPAP or BIPAP be used? In most cases, the mask only needs to be worn during sleep. Generally, the mask needs to be worn throughout the night and during any daytime naps. People with certain medical conditions may also need to wear the mask at other times, such as when they are awake. Follow instructions from your health care provider about when to use the machine. What happens during CPAP or BIPAP?  Both CPAP and BIPAP are provided by a small machine with a flexible plastic tube that attaches to a plastic mask that you wear. Air is blown through the mask into your nose or mouth. The amount of pressure that is used to blow the air can be adjusted  on the machine. Your health care provider will set the pressure setting and help you find the best mask for you. Tips for using the mask Because the mask needs to be snug, some people feel trapped or closed-in (claustrophobic) when first using the mask. If you feel this way, you may need to get used to the mask. One way to do this is to hold the mask loosely over your nose or mouth and then gradually apply the mask more snugly. You can also gradually increase the amount of time that you use the mask. Masks are available in various types and sizes. If your mask does not fit well, talk with your health care provider about getting a different one. Some common types of masks include: Full face masks, which fit over the mouth and  nose. Nasal masks, which fit over the nose. Nasal pillow or prong masks, which fit into the nostrils. If you are using a mask that fits over your nose and you tend to breathe through your mouth, a chin strap may be applied to help keep your mouth closed. Use a skin barrier to protect your skin as told by your health care provider. Some CPAP and BIPAP machines have alarms that may sound if the mask comes off or develops a leak. If you have trouble with the mask, it is very important that you talk with your health care provider about finding a way to make the mask easier to tolerate. Do not stop using the mask. There could be a negative impact on your health if you stop using the mask. Tips for using the machine Place your CPAP or BIPAP machine on a secure table or stand near an electrical outlet. Know where the on/off switch is on the machine. Follow instructions from your health care provider about how to set the pressure on your machine and when you should use it. Do not eat or drink while the CPAP or BIPAP machine is on. Food or fluids could get pushed into your lungs by the pressure of the CPAP or BIPAP. For home use, CPAP and BIPAP machines can be rented or purchased through home health care companies. Many different brands of machines are available. Renting a machine before purchasing may help you find out which particular machine works well for you. Your health insurance company may also decide which machine you may get. Keep the CPAP or BIPAP machine and attachments clean. Ask your health care provider for specific instructions. Check the humidifier if you have a dry stuffy nose or nosebleeds. Make sure it is working correctly. Follow these instructions at home: Take over-the-counter and prescription medicines only as told by your health care provider. Ask if you can take sinus medicine if your sinuses are blocked. Do not use any products that contain nicotine or tobacco. These products  include cigarettes, chewing tobacco, and vaping devices, such as e-cigarettes. If you need help quitting, ask your health care provider. Keep all follow-up visits. This is important. Contact a health care provider if: You have redness or pressure sores on your head, face, mouth, or nose from the mask or head gear. You have trouble using the CPAP or BIPAP machine. You cannot tolerate wearing the CPAP or BIPAP mask. Someone tells you that you snore even when wearing your CPAP or BIPAP. Get help right away if: You have trouble breathing. You feel confused. Summary CPAP and BIPAP are methods that use air pressure to keep your airways open and to help you  breathe well. If you have trouble with the mask, it is very important that you talk with your health care provider about finding a way to make the mask easier to tolerate. Do not stop using the mask. There could be a negative impact to your health if you stop using the mask. Follow instructions from your health care provider about when to use the machine. This information is not intended to replace advice given to you by your health care provider. Make sure you discuss any questions you have with your health care provider. Document Revised: 03/04/2021 Document Reviewed: 07/04/2020 Elsevier Patient Education  [place].

## 2022-04-16 NOTE — Progress Notes (Unsigned)
$'@Patient'f$  ID: Anthony Chapman, male    DOB: Nov 10, 1950, 71 y.o.   MRN: [id number]  Chief Complaint  Patient presents with   Follow-up    Referring provider: Plotnikov, Evie Lacks, MD  HPI: 71 year old male, some day smoker. PMH significant for CAD, HTN, unstable angina, OSA, hyperlipidemia, elevated PSA.   Previous [facility] pulmonary encounter: 12/29/2021 Patient presents today for sleep consult. Hx OSA. Self referred. Accompanied by his wife. He was previous seen by Dr. Corrie Dandy back in Febuary 2018, he had sleep study in February 2018 that showed moderate OSA with average AHI 16/hr. He was maintained on auto CPAP 5-15 cm h20. SD card brought today for compliance check. He is 20% complaint with use. His current CPAP machine is working. He is no longer getting CPAP supplies. At times dif tolerating mask but overall he does ok with CPAP and reports benefit from use. He would like to change DME companies to [employer].   Airview download 09/29/21-12/27/21 Usage 19/90 days (21%); 6 days > 4 hours Average usage days used 3 hours 42 mins Pressure 5-15cm h20 (11.8cm h20-95%) Airleaks 16.4L/min (95%) AHI 1.4  OSA (obstructive sleep apnea) - HST 09/2016 showed moderate OSA, average AHI 16/hr. Compliance with CPAP is fair. Needs CPAP supplies renewed. Current pressure 5-15cm h20; Residual AHI 1.4/hr. No pressure changes needed today, we will renew CPAP supplies and change DME company to [facility]. May need repeat HST. Advised patient to aim to wear CPAP every night 4-6 hours or longer. No driving if experiencing excessive daytime sleepiness. Reviewed risk of untreated sleep apnea. FU 3 months      04/14/2022- Interim hx  Patient presents today for OSA follow-up. Accompanied by his wife today during visit. HST in 09/2016 showed moderate OSA, average AHI 16/hour. Compliance with CPAP continues to be fair. He uses nasal cradle mask which after a few night will irritate the mole near his right nostril. He can not wear  full face mask d/t being a veteran and PTSD symptoms. He is going to speak with his PCP tomorrow about getting mole removed.   Airview download 03/16/2022 - 04/14/2022 Usage days 16/30 days (53%); 10 days (33%) greater than 4 hours Average usage total days 2 hours 23 minutes Average usage days used 4 hours 28 minutes Pressure 5 to 15 cm H2O (11 cm H2O-95%) Air leaks 3.6 L/min (95%) AHI 1.0  Allergies  Allergen Reactions   Losartan Other (See Comments)    angioedema    Immunization History  Administered Date(s) Administered   PFIZER Comirnaty(Gray Top)Covid-19 Tri-Sucrose Vaccine 02/01/2020, 03/02/2020, 08/06/2020   Tdap 07/25/2012    Past Medical History:  Diagnosis Date   Allergy    Spring- mild    Anginal pain ([place])    Arthritis    "all over" (08/27/2016)   CAD (coronary artery disease)    a.  stent placement to the RCA in 2010 b. cath in 2015 with PCI to the RCA (2.39m x164mResolute DES) and LCx (3.19m57m5m13msolute DES).  c. 08/2016: cath showing 100% ISR of d-RCA with collaterals, 10% Ost LAD ISR, 90% mid-Cx stenosis (3.0x18mm85molute DES placed)   ED (erectile dysfunction)    GERD (gastroesophageal reflux disease)    HTN (hypertension)    Hyperlipemia    LBBB (left bundle branch block)    Myocardial infarction (HCC) [place]0; 2015   Sleep apnea    wears cpap     Tobacco History: Social History   Tobacco Use  Smoking Status Former   Packs/day: 1.00   Years: 30.00   Total pack years: 30.00   Types: Cigarettes, Cigars   Quit date: 08/16/1998   Years since quitting: 23.6  Smokeless Tobacco Never   Counseling given: Not Answered   Outpatient Medications Prior to Visit  Medication Sig Dispense Refill   amLODipine (NORVASC) 10 MG tablet TAKE 1 TABLET BY MOUTH  DAILY 90 tablet 3   aspirin 81 MG EC tablet Take 1 tablet (81 mg total) by mouth daily. 30 tablet 0   carvedilol (COREG) 25 MG tablet Take 1 tablet (25 mg total) by mouth 2 (two) times daily with a meal. 180  tablet 3   cetirizine (ZYRTEC) 10 MG tablet Take 1 tablet (10 mg total) by mouth daily as needed for allergies. 90 tablet 3   ezetimibe (ZETIA) 10 MG tablet Take 1 tablet (10 mg total) by mouth daily. 90 tablet 3   isosorbide mononitrate (IMDUR) 60 MG 24 hr tablet Take 1 tablet (60 mg total) by mouth daily. 90 tablet 3   montelukast (SINGULAIR) 10 MG tablet Take 10 mg by mouth daily as needed (allergies).     nitroGLYCERIN (NITROSTAT) 0.4 MG SL tablet DISSOLVE 1 TABLET UNDER THE TONGUE EVERY 5 MINUTES AS NEEDED FOR CHEST PAIN. MAX 3 TABLETS IN 15 MINUTES. CALL 911 IF PAIN PERSISTS. 45 tablet 3   ranolazine (RANEXA) 500 MG 12 hr tablet TAKE 2 TABLETS BY MOUTH TWICE  DAILY 360 tablet 3   rosuvastatin (CRESTOR) 40 MG tablet Take 1 tablet (40 mg total) by mouth daily. 90 tablet 3   spironolactone (ALDACTONE) 25 MG tablet Take 0.5 tablets (12.5 mg total) by mouth daily. 45 tablet 3   VITAMIN D, CHOLECALCIFEROL, PO Take 1 tablet by mouth daily as needed (vitamin d supplement).     No facility-administered medications prior to visit.      Review of Systems  Review of Systems  Constitutional: Negative.   HENT: Negative.    Respiratory: Negative.    Cardiovascular: Negative.      Physical Exam  BP 124/72 (BP Location: Left Arm, Patient Position: Sitting, Cuff Size: Large)   Pulse 76   Temp 98 F (36.7 C) (Oral)   Ht '5\' 9"'$  (1.753 m)   Wt 256 lb 6.4 oz (116.3 kg)   SpO2 96%   BMI 37.86 kg/m  Physical Exam Constitutional:      Appearance: Normal appearance.  HENT:     Head: Normocephalic and atraumatic.  Cardiovascular:     Rate and Rhythm: Normal rate and regular rhythm.  Pulmonary:     Effort: Pulmonary effort is normal.     Breath sounds: Normal breath sounds.  Neurological:     General: No focal deficit present.     Mental Status: He is alert and oriented to person, place, and time. Mental status is at baseline.  Psychiatric:        Mood and Affect: Mood normal.         Behavior: Behavior normal.        Thought Content: Thought content normal.        Judgment: Judgment normal.      Lab Results:  CBC    Component Value Date/Time   WBC 4.6 01/28/2022 1122   RBC 4.67 01/28/2022 1122   HGB 12.6 (L) 01/28/2022 1122   HGB 13.1 05/19/2020 1253   HCT 38.7 (L) 01/28/2022 1122   HCT 39.9 05/19/2020 1253   PLT 142.0 (L) 01/28/2022  1122   PLT 159 05/19/2020 1253   MCV 83.0 01/28/2022 1122   MCV 80 05/19/2020 1253   MCH 26.4 10/01/2020 0035   MCHC 32.5 01/28/2022 1122   RDW 15.0 01/28/2022 1122   RDW 13.7 05/19/2020 1253   LYMPHSABS 1.0 01/28/2022 1122   LYMPHSABS 1.5 09/07/2019 1648   MONOABS 0.6 01/28/2022 1122   EOSABS 0.3 01/28/2022 1122   EOSABS 0.4 09/07/2019 1648   BASOSABS 0.0 01/28/2022 1122   BASOSABS 0.1 09/07/2019 1648    BMET    Component Value Date/Time   NA 137 01/28/2022 1122   NA 137 09/04/2021 1503   K 4.1 01/28/2022 1122   CL 102 01/28/2022 1122   CO2 28 01/28/2022 1122   GLUCOSE 121 (H) 01/28/2022 1122   BUN 15 01/28/2022 1122   BUN 14 09/04/2021 1503   CREATININE 1.04 01/28/2022 1122   CREATININE 0.96 12/07/2016 1516   CALCIUM 9.0 01/28/2022 1122   GFRNONAA >60 10/01/2020 0035   GFRAA 106 05/19/2020 1253    BNP    Component Value Date/Time   BNP 75.4 10/01/2020 0035    ProBNP No results found for: "PROBNP"  Imaging: VAS [place] LE ART SEG MULTI (Segm&LE Reynauds)  Result Date: 04/13/2022  LOWER EXTREMITY DOPPLER STUDY Patient Name:  MADDEN GARRON  Date of Exam:   04/13/2022 Medical Rec #: [id number]        Accession #:    [id number] Date of Birth: [birth year]         Patient Gender: M Patient Age:   71 years Exam Location:  [facility] Procedure:      VAS [place] LOWER EXT ART SEG MULTI (SEGMENTALS & LE RAYNAUDS) Referring Phys: Collier Salina [place] --------------------------------------------------------------------------------  Indications: Patient reports that over the past month or so has experienced              right buttocks and  hip pain, that typically occurs at rest or at              night when laying down. Once he is up and moving it seems to get              better. High Risk Factors: Hypertension, hyperlipidemia, past history of smoking,                    coronary artery disease.  Performing Technologist: Mariane Masters RVT  Examination Guidelines: A complete evaluation includes at minimum, Doppler waveform signals and systolic blood pressure reading at the level of bilateral brachial, anterior tibial, and posterior tibial arteries, when vessel segments are accessible. Bilateral testing is considered an integral part of a complete examination. Photoelectric Plethysmograph (PPG) waveforms and toe systolic pressure readings are included as required and additional duplex testing as needed. Limited examinations for reoccurring indications may be performed as noted.  ABI Findings: +---------+------------------+-----+---------+--------+ Right    Rt Pressure (mmHg)IndexWaveform Comment  +---------+------------------+-----+---------+--------+ Brachial 176                                      +---------+------------------+-----+---------+--------+ CFA                             triphasic         +---------+------------------+-----+---------+--------+ Popliteal  triphasic         +---------+------------------+-----+---------+--------+ PTA      171               0.97 triphasic         +---------+------------------+-----+---------+--------+ PERO     192               1.09 triphasic         +---------+------------------+-----+---------+--------+ DP       194               1.10 triphasic         +---------+------------------+-----+---------+--------+ Great Toe158               0.90 Normal            +---------+------------------+-----+---------+--------+ +---------+------------------+-----+---------+-------+ Left     Lt Pressure (mmHg)IndexWaveform Comment  +---------+------------------+-----+---------+-------+ Brachial 174                                     +---------+------------------+-----+---------+-------+ CFA                             triphasic        +---------+------------------+-----+---------+-------+ Popliteal                       triphasic        +---------+------------------+-----+---------+-------+ PTA      176               1.00 biphasic         +---------+------------------+-----+---------+-------+ PERO     198               1.12 triphasic        +---------+------------------+-----+---------+-------+ DP       185               1.05 triphasic        +---------+------------------+-----+---------+-------+ Great Toe160               0.91 Normal           +---------+------------------+-----+---------+-------+ +-------+-----------+-----------+------------+------------+ ABI/TBIToday's ABIToday's TBIPrevious ABIPrevious TBI +-------+-----------+-----------+------------+------------+ Right  1.10       0.90                                +-------+-----------+-----------+------------+------------+ Left   1.13       0.91                                +-------+-----------+-----------+------------+------------+  Summary: Right: Resting right ankle-brachial index is within normal range. The right toe-brachial index is normal. Left: Resting left ankle-brachial index is within normal range. The left toe-brachial index is normal. *See table(s) above for measurements and observations.  Electronically signed by Jenkins Rouge MD on 04/13/2022 at 8:31:07 PM.    Final    CUP PACEART REMOTE DEVICE CHECK  Result Date: 04/06/2022 Scheduled remote reviewed. Normal device function.  Next remote 91 days- JJB    Assessment & Plan:   No problem-specific Assessment & Plan notes found for this encounter.     Martyn Ehrich, NP 04/16/2022

## 2022-04-18 NOTE — Assessment & Plan Note (Signed)
-   HST 09/2016 showed moderate OSA, average AHI 16/hr. Compliance continues to be a struggle, however, it has improved since our last visit. He has PTSD symptoms and can only tolerate nasal mask. He has a mole near his nostril that gets irritated at time, he is looking at getting this removed. He is 53% compliant with CPAP use. Average usage 2 hours 23 mins. Current pressure 5-15cm h20 without residual apneas. Average AHI 1.0/hour. Stressed the importance of compliance with CPAP 4-6 hour EVERY night. We reviewed risks of untreated sleep apnea include cardiac arrhythmias, pulmonary hypertension, stroke and diabetes. Encourage weight loss efforts and side sleeping position. FU 3 to 4 months Beth NP for CPAP compliance check.

## 2022-04-22 ENCOUNTER — Other Ambulatory Visit: Payer: Self-pay | Admitting: Cardiology

## 2022-04-30 NOTE — Progress Notes (Signed)
Remote pacemaker transmission.   

## 2022-05-21 ENCOUNTER — Telehealth: Payer: Self-pay | Admitting: Cardiology

## 2022-05-21 NOTE — Telephone Encounter (Signed)
Patient returned RN's call.  Patient stated that since his tests came out OK the forms will not need to be completed, they can be destroyed.

## 2022-05-21 NOTE — Telephone Encounter (Signed)
Spoke with pt regarding disability paperwork. Pt states that he no longer needs these forms and that we can destroy them. Will forward to primary nurse to make aware.

## 2022-07-06 ENCOUNTER — Ambulatory Visit (INDEPENDENT_AMBULATORY_CARE_PROVIDER_SITE_OTHER): Payer: Medicare Other

## 2022-07-06 DIAGNOSIS — I442 Atrioventricular block, complete: Secondary | ICD-10-CM

## 2022-07-06 LAB — CUP PACEART REMOTE DEVICE CHECK
Battery Remaining Longevity: 108 mo
Battery Remaining Percentage: 88 %
Battery Voltage: 3.02 V
Brady Statistic AP VP Percent: 1 %
Brady Statistic AP VS Percent: 4.9 %
Brady Statistic AS VP Percent: 1 %
Brady Statistic AS VS Percent: 95 %
Brady Statistic RA Percent Paced: 4.7 %
Brady Statistic RV Percent Paced: 1 %
Date Time Interrogation Session: 20231128020014
Implantable Lead Connection Status: 753985
Implantable Lead Connection Status: 753985
Implantable Lead Implant Date: 20220223
Implantable Lead Implant Date: 20220223
Implantable Lead Location: 753859
Implantable Lead Location: 753860
Implantable Pulse Generator Implant Date: 20220223
Lead Channel Impedance Value: 450 Ohm
Lead Channel Impedance Value: 510 Ohm
Lead Channel Pacing Threshold Amplitude: 0.75 V
Lead Channel Pacing Threshold Amplitude: 0.75 V
Lead Channel Pacing Threshold Pulse Width: 0.5 ms
Lead Channel Pacing Threshold Pulse Width: 0.5 ms
Lead Channel Sensing Intrinsic Amplitude: 12 mV
Lead Channel Sensing Intrinsic Amplitude: 2.2 mV
Lead Channel Setting Pacing Amplitude: 2 V
Lead Channel Setting Pacing Amplitude: 2.5 V
Lead Channel Setting Pacing Pulse Width: 0.5 ms
Lead Channel Setting Sensing Sensitivity: 2 mV
Pulse Gen Model: 2272
Pulse Gen Serial Number: 3900218

## 2022-07-22 ENCOUNTER — Other Ambulatory Visit: Payer: Medicare Other

## 2022-08-04 NOTE — Progress Notes (Signed)
Remote pacemaker transmission.   

## 2022-08-06 IMAGING — CR DG CHEST 2V
2 series · 2 of 2 positions shown · non-contrast
Comparison: 09/29/2020.

CLINICAL DATA: Cardiac device in situ.

EXAM:
CHEST - 2 VIEW

[chest lat]
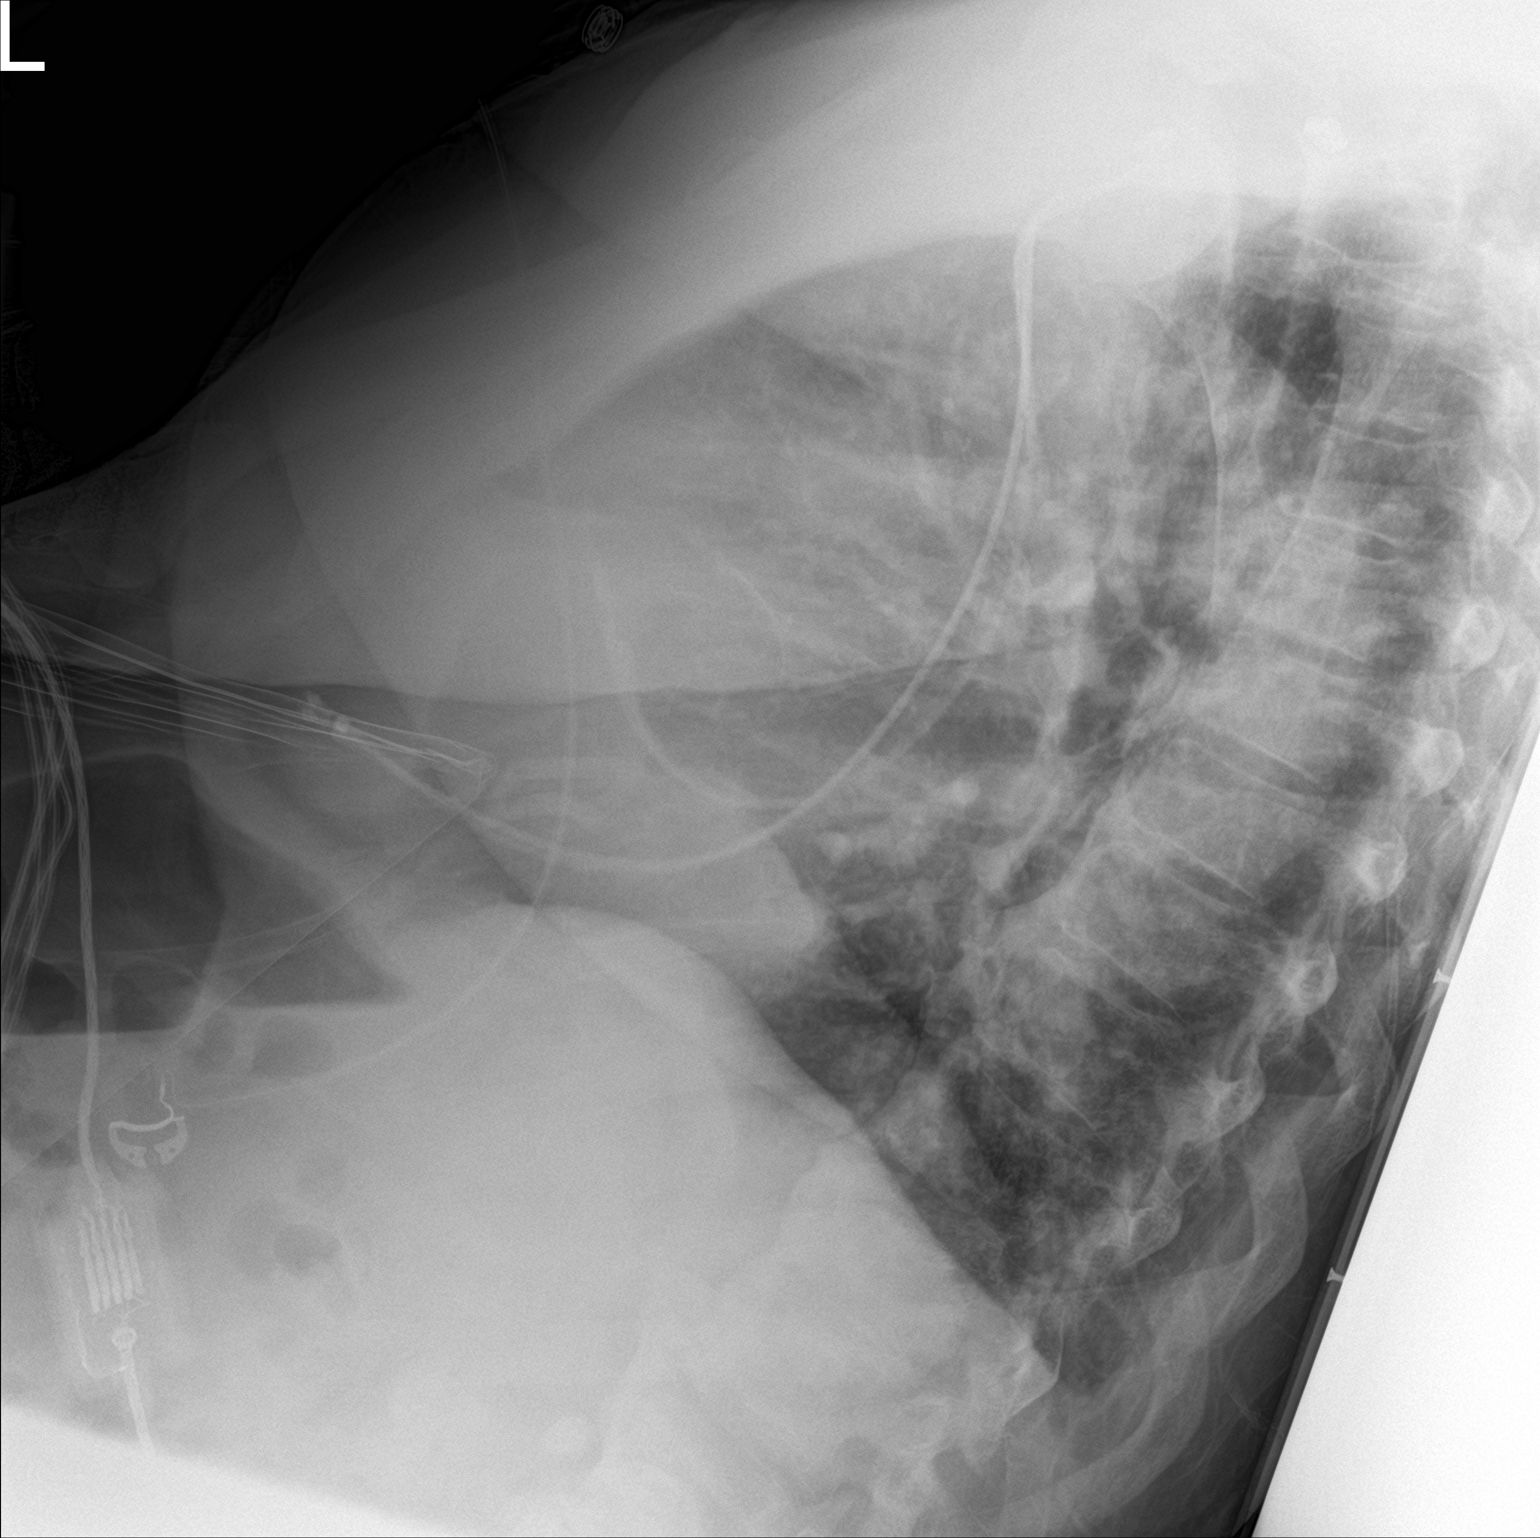

[chest ap]
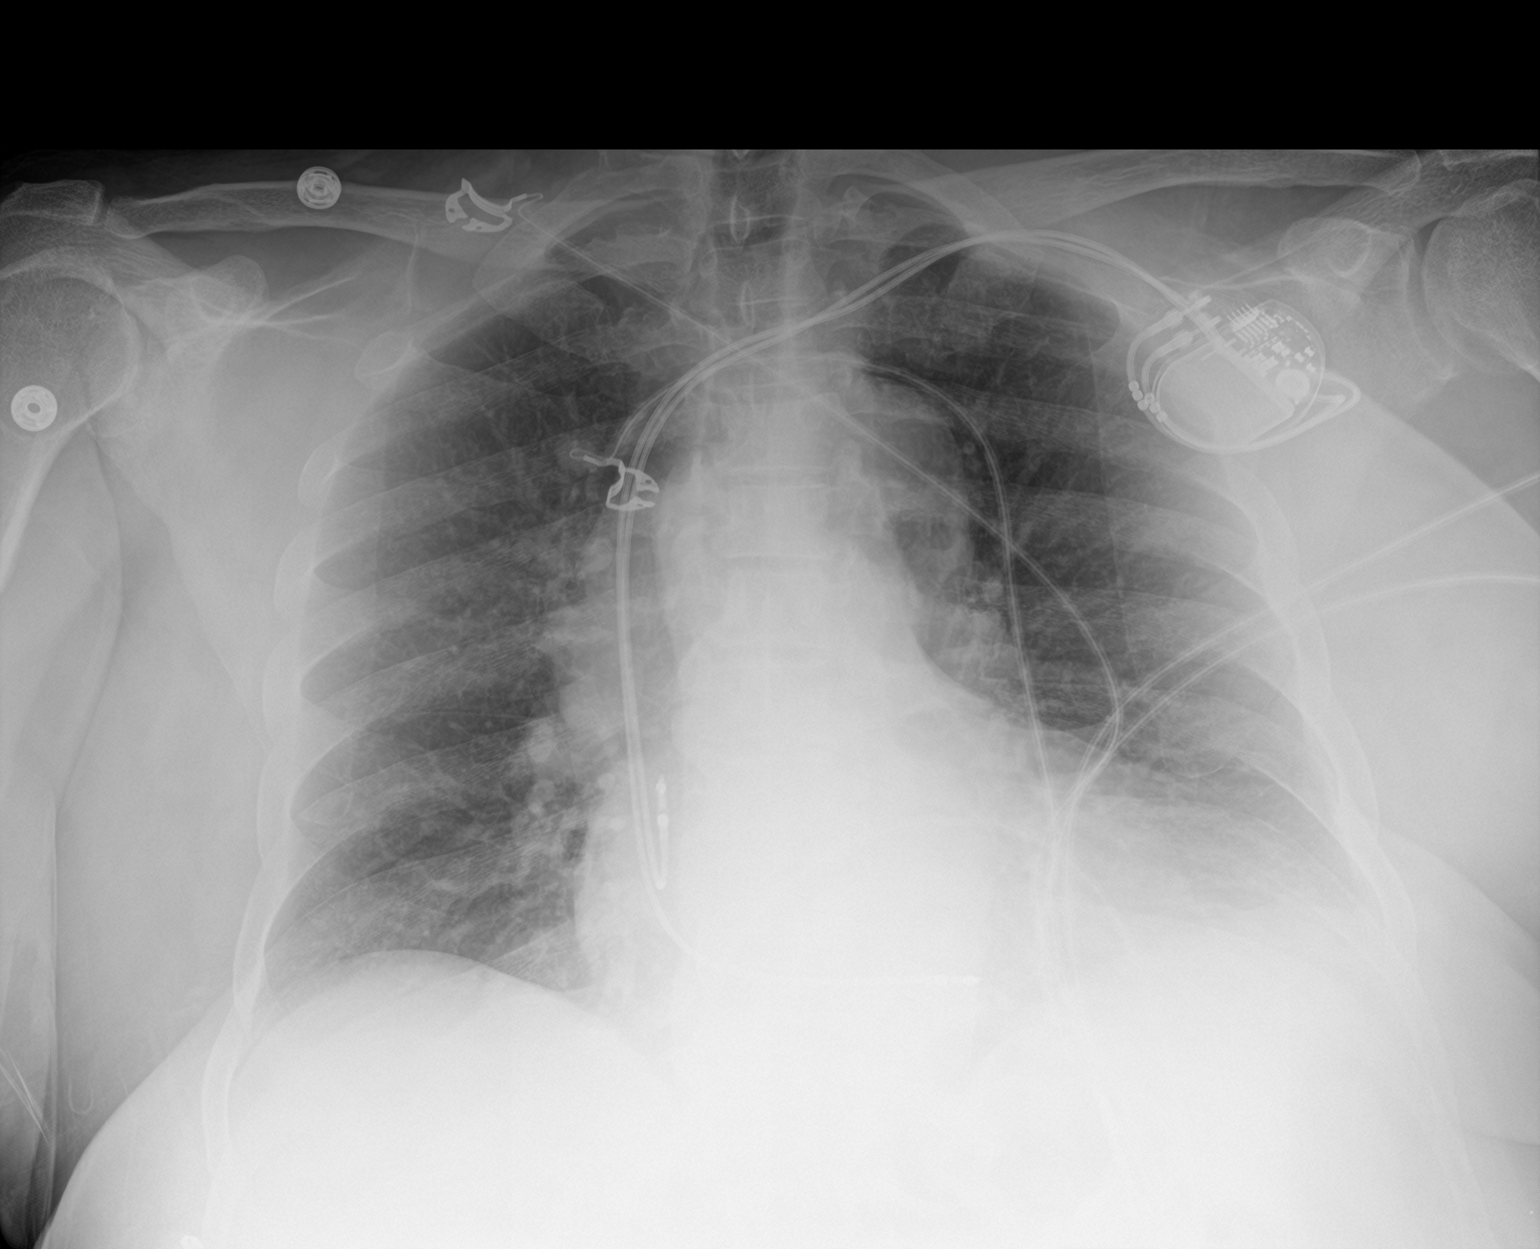

[2 of 2 positions shown; findings below may reference images not displayed]

FINDINGS: Mediastinum appears stable. Cardiac pacer noted with lead tips over
the right atrium right ventricle. Stable mild cardiomegaly. No
pulmonary venous congestion. Stable tortuosity of the thoracic aorta
noted. Low lung volumes with mild bibasilar atelectasis. Tiny left
pleural effusion cannot be excluded. No pneumothorax.
IMPRESSION: 1. Cardiac pacer noted with lead tips over the right atrium and
right ventricle. Stable mild cardiomegaly. No pulmonary venous
congestion.
2. Low lung volumes with mild bibasilar atelectasis.

## 2022-08-12 ENCOUNTER — Ambulatory Visit (INDEPENDENT_AMBULATORY_CARE_PROVIDER_SITE_OTHER): Payer: Medicare Other

## 2022-08-12 VITALS — Ht 69.0 in | Wt 248.0 lb

## 2022-08-12 DIAGNOSIS — Z0000 Encounter for general adult medical examination without abnormal findings: Secondary | ICD-10-CM

## 2022-08-12 NOTE — Patient Instructions (Signed)
Anthony Chapman , Thank you for taking time to come for your Medicare Wellness Visit. I appreciate your ongoing commitment to your health goals. Please review the following plan we discussed and let me know if I can assist you in the future.   These are the goals we discussed:  Goals      My goal for 2024 is to lose 50 pounds.        This is a list of the screening recommended for you and due dates:  Health Maintenance  Topic Date Due   Pneumonia Vaccine (1 - PCV) Never done   Complete foot exam   Never done   Eye exam for diabetics  Never done   Yearly kidney health urinalysis for diabetes  Never done   Hepatitis C Screening: USPSTF Recommendation to screen - Ages 97-79 yo.  Never done   Flu Shot  Never done   COVID-19 Vaccine (4 - 2023-24 season) 04/09/2022   DTaP/Tdap/Td vaccine (2 - Td or Tdap) 07/25/2022   Hemoglobin A1C  07/30/2022   Yearly kidney function blood test for diabetes  01/29/2023   Medicare Annual Wellness Visit  08/13/2023   Colon Cancer Screening  09/04/2023   HPV Vaccine  Aged Out   Zoster (Shingles) Vaccine  Discontinued    Advanced directives: No; Advance directive discussed with you today. Even though you declined this today please call our office should you change your mind and we can give you the proper paperwork for you to fill out.  Conditions/risks identified: Yes  Next appointment: Follow up in one year for your annual wellness visit.   Preventive Care 10 Years and Older, Male  Preventive care refers to lifestyle choices and visits with your health care provider that can promote health and wellness. What does preventive care include? A yearly physical exam. This is also called an annual well check. Dental exams once or twice a year. Routine eye exams. Ask your health care provider how often you should have your eyes checked. Personal lifestyle choices, including: Daily care of your teeth and gums. Regular physical activity. Eating a healthy  diet. Avoiding tobacco and drug use. Limiting alcohol use. Practicing safe sex. Taking low doses of aspirin every day. Taking vitamin and mineral supplements as recommended by your health care provider. What happens during an annual well check? The services and screenings done by your health care provider during your annual well check will depend on your age, overall health, lifestyle risk factors, and family history of disease. Counseling  Your health care provider may ask you questions about your: Alcohol use. Tobacco use. Drug use. Emotional well-being. Home and relationship well-being. Sexual activity. Eating habits. History of falls. Memory and ability to understand (cognition). Work and work Statistician. Screening  You may have the following tests or measurements: Height, weight, and BMI. Blood pressure. Lipid and cholesterol levels. These may be checked every 5 years, or more frequently if you are over 69 years old. Skin check. Lung cancer screening. You may have this screening every year starting at age 48 if you have a 30-pack-year history of smoking and currently smoke or have quit within the past 15 years. Fecal occult blood test (FOBT) of the stool. You may have this test every year starting at age 20. Flexible sigmoidoscopy or colonoscopy. You may have a sigmoidoscopy every 5 years or a colonoscopy every 10 years starting at age 79. Prostate cancer screening. Recommendations will vary depending on your family history and other risks.  Hepatitis C blood test. Hepatitis B blood test. Sexually transmitted disease (STD) testing. Diabetes screening. This is done by checking your blood sugar (glucose) after you have not eaten for a while (fasting). You may have this done every 1-3 years. Abdominal aortic aneurysm (AAA) screening. You may need this if you are a current or former smoker. Osteoporosis. You may be screened starting at age 65 if you are at high risk. Talk with  your health care provider about your test results, treatment options, and if necessary, the need for more tests. Vaccines  Your health care provider may recommend certain vaccines, such as: Influenza vaccine. This is recommended every year. Tetanus, diphtheria, and acellular pertussis (Tdap, Td) vaccine. You may need a Td booster every 10 years. Zoster vaccine. You may need this after age 57. Pneumococcal 13-valent conjugate (PCV13) vaccine. One dose is recommended after age 66. Pneumococcal polysaccharide (PPSV23) vaccine. One dose is recommended after age 69. Talk to your health care provider about which screenings and vaccines you need and how often you need them. This information is not intended to replace advice given to you by your health care provider. Make sure you discuss any questions you have with your health care provider. Document Released: 08/22/2015 Document Revised: 04/14/2016 Document Reviewed: 05/27/2015 Elsevier Interactive Patient Education  2017 [employer] Prevention in the Home Falls can cause injuries. They can happen to people of all ages. There are many things you can do to make your home safe and to help prevent falls. What can I do on the outside of my home? Regularly fix the edges of walkways and driveways and fix any cracks. Remove anything that might make you trip as you walk through a door, such as a raised step or threshold. Trim any bushes or trees on the path to your home. Use bright outdoor lighting. Clear any walking paths of anything that might make someone trip, such as rocks or tools. Regularly check to see if handrails are loose or broken. Make sure that both sides of any steps have handrails. Any raised decks and porches should have guardrails on the edges. Have any leaves, snow, or ice cleared regularly. Use sand or salt on walking paths during winter. Clean up any spills in your garage right away. This includes oil or grease spills. What  can I do in the bathroom? Use night lights. Install grab bars by the toilet and in the tub and shower. Do not use towel bars as grab bars. Use non-skid mats or decals in the tub or shower. If you need to sit down in the shower, use a plastic, non-slip stool. Keep the floor dry. Clean up any water that spills on the floor as soon as it happens. Remove soap buildup in the tub or shower regularly. Attach bath mats securely with double-sided non-slip rug tape. Do not have throw rugs and other things on the floor that can make you trip. What can I do in the bedroom? Use night lights. Make sure that you have a light by your bed that is easy to reach. Do not use any sheets or blankets that are too big for your bed. They should not hang down onto the floor. Have a firm chair that has side arms. You can use this for support while you get dressed. Do not have throw rugs and other things on the floor that can make you trip. What can I do in the kitchen? Clean up any spills right away. Avoid  walking on wet floors. Keep items that you use a lot in easy-to-reach places. If you need to reach something above you, use a strong step stool that has a grab bar. Keep electrical cords out of the way. Do not use floor polish or wax that makes floors slippery. If you must use wax, use non-skid floor wax. Do not have throw rugs and other things on the floor that can make you trip. What can I do with my stairs? Do not leave any items on the stairs. Make sure that there are handrails on both sides of the stairs and use them. Fix handrails that are broken or loose. Make sure that handrails are as long as the stairways. Check any carpeting to make sure that it is firmly attached to the stairs. Fix any carpet that is loose or worn. Avoid having throw rugs at the top or bottom of the stairs. If you do have throw rugs, attach them to the floor with carpet tape. Make sure that you have a light switch at the top of the  stairs and the bottom of the stairs. If you do not have them, ask someone to add them for you. What else can I do to help prevent falls? Wear shoes that: Do not have high heels. Have rubber bottoms. Are comfortable and fit you well. Are closed at the toe. Do not wear sandals. If you use a stepladder: Make sure that it is fully opened. Do not climb a closed stepladder. Make sure that both sides of the stepladder are locked into place. Ask someone to hold it for you, if possible. Clearly mark and make sure that you can see: Any grab bars or handrails. First and last steps. Where the edge of each step is. Use tools that help you move around (mobility aids) if they are needed. These include: Canes. Walkers. Scooters. Crutches. Turn on the lights when you go into a dark area. Replace any light bulbs as soon as they burn out. Set up your furniture so you have a clear path. Avoid moving your furniture around. If any of your floors are uneven, fix them. If there are any pets around you, be aware of where they are. Review your medicines with your doctor. Some medicines can make you feel dizzy. This can increase your chance of falling. Ask your doctor what other things that you can do to help prevent falls. This information is not intended to replace advice given to you by your health care provider. Make sure you discuss any questions you have with your health care provider. Document Released: 05/22/2009 Document Revised: 01/01/2016 Document Reviewed: 08/30/2014 Elsevier Interactive Patient Education  2017 [employer].

## 2022-08-12 NOTE — Progress Notes (Addendum)
Virtual Visit via Telephone Note  I connected with  Anthony Chapman on 08/12/22 at  4:00 PM EST by telephone and verified that I am speaking with the correct person using two identifiers.  Location: Patient: Home Provider: [place] Persons participating in the virtual visit: [place]   I discussed the limitations, risks, security and privacy concerns of performing an evaluation and management service by telephone and the availability of in person appointments. The patient expressed understanding and agreed to proceed.  Interactive audio and video telecommunications were attempted between this nurse and patient, however failed, due to patient having technical difficulties OR patient did not have access to video capability.  We continued and completed visit with audio only.  Some vital signs may be absent or patient reported.   Sheral Flow, LPN  Subjective:   Anthony Chapman is a 72 y.o. male who presents for an Initial Medicare Annual Wellness Visit.  Review of Systems     Cardiac Risk Factors include: advanced age (>31mn, >>86women);family history of premature cardiovascular disease;dyslipidemia;hypertension;male gender;obesity (BMI >30kg/m2);sedentary lifestyle     Objective:    Today's Vitals   08/12/22 1602  Weight: 248 lb (112.5 kg)  Height: _0  (1.753 m)  PainSc: 0-No pain   Body mass index is 36.62 kg/m.     08/12/2022    4:05 PM 09/29/2020   12:12 PM 05/20/2020    8:48 AM 09/23/2016    4:25 PM 08/27/2016    3:29 PM 08/27/2016    9:02 AM 01/18/2016   12:50 PM  Advanced Directives  Does Patient Have a Medical Advance Directive? _1  No No  Would patient like information on creating a medical advance directive? No - Patient declined No - Patient declined No - Patient declined  Yes (Inpatient - patient defers creating a medical advance directive at this time) No - Patient declined No - patient declined information     Current Medications (verified) Outpatient Encounter Medications as of 08/12/2022  Medication Sig   amoxicillin (AMOXIL) 500 MG capsule Take 500 mg by mouth 3 (three) times daily.   amLODipine (NORVASC) 10 MG tablet TAKE 1 TABLET BY MOUTH  DAILY   aspirin 81 MG EC tablet Take 1 tablet (81 mg total) by mouth daily.   carvedilol (COREG) 25 MG tablet Take 1 tablet (25 mg total) by mouth 2 (two) times daily with a meal.   cetirizine (ZYRTEC) 10 MG tablet Take 1 tablet (10 mg total) by mouth daily as needed for allergies.   ezetimibe (ZETIA) 10 MG tablet Take 1 tablet (10 mg total) by mouth daily.   isosorbide mononitrate (IMDUR) 60 MG 24 hr tablet Take 1 tablet (60 mg total) by mouth daily.   montelukast (SINGULAIR) 10 MG tablet Take 10 mg by mouth daily as needed (allergies).   nitroGLYCERIN (NITROSTAT) 0.4 MG SL tablet DISSOLVE 1 TABLET UNDER THE  TONGUE EVERY 5 MINUTES AS NEEDED FOR CHEST PAIN. MAX OF 3 TABLETS IN 15 MINUTES. CALL 911 IF PAIN  PERSISTS.   ranolazine (RANEXA) 500 MG 12 hr tablet TAKE 2 TABLETS BY MOUTH TWICE  DAILY   rosuvastatin (CRESTOR) 40 MG tablet TAKE 1 TABLET BY MOUTH  DAILY   spironolactone (ALDACTONE) 25 MG tablet Take 0.5 tablets (12.5 mg total) by mouth daily.   VITAMIN D, CHOLECALCIFEROL, PO Take 1 tablet by mouth daily as needed (vitamin d supplement).   No facility-administered encounter medications on file as of 08/12/2022.  Allergies (verified) Losartan   History: Past Medical History:  Diagnosis Date   Allergy    [place]- mild    Anginal pain ([place])    Arthritis    "all over" (08/27/2016)   CAD (coronary artery disease)    a.  stent placement to the RCA in 2010 b. cath in 2015 with PCI to the RCA (2.58m x181mResolute DES) and LCx (3.37m49m77m26msolute DES).  c. 08/2016: cath showing 100% ISR of d-RCA with collaterals, 10% Ost LAD ISR, 90% mid-Cx stenosis (3.0x18mm337molute DES placed)   ED (erectile dysfunction)    GERD (gastroesophageal reflux  disease)    HTN (hypertension)    Hyperlipemia    LBBB (left bundle branch block)    Myocardial infarction (HCC) [place]0; 2015   Sleep apnea    wears cpap    Past Surgical History:  Procedure Laterality Date   CARDIAC CATHETERIZATION  09/2013   CARDIAC CATHETERIZATION N/A 08/27/2016   Procedure: Left Heart Cath and Coronary Angiography;  Surgeon: DavidLeonie Man  [facility];  Service: Cardiovascular;  Laterality: N/A;   CARDIAC CATHETERIZATION N/A 08/27/2016   Procedure: Coronary Stent Intervention;  Surgeon: DavidLeonie Man  [facility];  Service: Cardiovascular;  Laterality: N/A;   COLONOSCOPY  2004   CORONARY ANGIOPLASTY WITH STENT PLACEMENT  2010; 1/192018   HERNIA REPAIR     INGUINAL HERNIA REPAIR Left    LEFT HEART CATH AND CORONARY ANGIOGRAPHY N/A 05/20/2020   Procedure: LEFT HEART CATH AND CORONARY ANGIOGRAPHY;  Surgeon: JordaMartiniqueer M, MD;  [facility];  Service: Cardiovascular;  Laterality: N/A;   PACEMAKER IMPLANT N/A 10/01/2020   Procedure: PACEMAKER IMPLANT;  Surgeon: AllreThompson Grayer  [facility];  Service: Cardiovascular;  Laterality: N/A;   UMBILICAL HERNIA REPAIR     Family History  Problem Relation Age of Onset   Hypertension Mother    CAD Mother    Hypertension Father    Arthritis Father    Hypertension Other    CAD Maternal Aunt    Colon cancer Neg Hx    Colon polyps Neg Hx    Esophageal cancer Neg Hx    Rectal cancer Neg Hx    Stomach cancer Neg Hx    Social History   Socioeconomic History   Marital status: Married    Spouse name: Not on file   Number of children: Not on file   Years of education: Not on file   Highest education level: Not on file  Occupational History   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         Social Determinants of Health   Financial Resource Strain: Low Risk  (08/12/2022)   Overall Financial Resource Strain (CARDIA)    Difficulty of Paying Living Expenses: Not hard at all  Food Insecurity: No Food Insecurity (08/12/2022)  Hunger Vital Sign    Worried About Running Out of Food in the Last Year: Never true    Ran Out of Food in the Last Year: Never true  Transportation Needs: No Transportation Needs (08/12/2022)   PRAPARE - Hydrologist (Medical): No    Lack of Transportation (Non-Medical): No  Physical Activity: Inactive (08/12/2022)   Exercise Vital Sign    Days of Exercise per Week: 0 days    Minutes of Exercise per Session: 0 min  Stress: Stress Concern Present (08/12/2022)   [place]    Feeling of Stress : Rather much  Social Connections: Socially Integrated (08/12/2022)   Social Connection and Isolation Panel [NHANES]    Frequency of Communication with Friends and Family: More than three times a week    Frequency of Social Gatherings with Friends and Family: More than three times a week    Attends Religious Services: More than 4 times per year    Active Member of [employer] or Organizations: Yes    Attends Music therapist: More than 4 times per year    Marital Status: Married    Tobacco Counseling Counseling given: Not Answered   Clinical Intake:  Pre-visit preparation completed: Yes  Pain : No/denies pain Pain Score: 0-No pain     BMI - recorded: 36.62 Nutritional Status: BMI > 30  Obese Nutritional Risks: None Diabetes: No  How often do you need to have someone help you when  you read instructions, pamphlets, or other written materials from your doctor or pharmacy?: 1 - Never What is the last grade level you completed in school?: [employer]; [place]  Diabetic? No  Interpreter Needed?: No  Information entered by :: Lisette Abu, LPN.   Activities of Daily Living    08/12/2022    4:08 PM  In your present state of health, do you have any difficulty performing the following activities:  Hearing? 1  Vision? 0  Difficulty concentrating or making decisions? 0  Walking or climbing stairs? 0  Dressing or bathing? 0  Doing errands, shopping? 0  Preparing Food and eating ? N  Using the Toilet? N  In the past six months, have you accidently leaked urine? N  Do you have problems with loss of bowel control? N  Managing your Medications? N  Managing your Finances? N  Housekeeping or managing your Housekeeping? N    Patient Care Team: Plotnikov, Evie Lacks, MD as PCP - General Martinique, Peter M, MD as PCP - Cardiology (Cardiology) [name], MD as PCP - Electrophysiology (Cardiology) Marijo File Revonda Standard., MD (Cardiology) Kathie Rhodes, MD (Inactive) as Consulting Physician (Urology)  Indicate any recent Medical Services you may have received from other than [facility] providers in the past year (date may be approximate).     Assessment:   This is a routine wellness examination for Anthony Chapman.  Hearing/Vision screen Hearing Screening - Comments:: Having difficulty hearing.  No hearing aids at this point.  Appointment with [facility] for hearing evaluation.  Vision Screening - Comments:: Wears rx glasses - up to date with routine eye exams with [facility]   Dietary issues and exercise activities discussed: Current Exercise Habits: The patient does not participate in regular exercise at present;The patient has a physically strenuous job, but has no regular exercise apart from work., Exercise limited by: None identified;orthopedic condition(s)   Goals  Addressed  This Visit's Progress    My goal for 2024 is to lose 50 pounds.        Depression Screen    08/12/2022    4:08 PM 01/28/2022   10:37 AM 09/27/2016    5:08 PM  PHQ 2/9 Scores  PHQ - 2 Score 0 0 0    Fall Risk    08/12/2022    4:05 PM 01/28/2022   10:37 AM 09/23/2016    8:39 AM  [place] in the past year? 0 0 No  Number falls in past yr: 0 0   Injury with Fall? 0 0   Risk for fall due to : No Fall Risks No Fall Risks   Follow up Falls prevention discussed      FALL RISK PREVENTION PERTAINING TO THE HOME:  Any stairs in or around the home? No  If so, are there any without handrails? No  Home free of loose throw rugs in walkways, pet beds, electrical cords, etc? Yes  Adequate lighting in your home to reduce risk of falls? Yes   ASSISTIVE DEVICES UTILIZED TO PREVENT FALLS:  Life alert? No  Use of a cane, walker or w/c? No  Grab bars in the bathroom? No  Shower chair or bench in shower? No  Elevated toilet seat or a handicapped toilet? Yes   TIMED UP AND GO:  Was the test performed? No . Phone Visit  Cognitive Function:        08/12/2022    4:08 PM  6CIT Screen  What Year? 0 points  What month? 0 points  What time? 0 points  Count back from 20 0 points  Months in reverse 0 points  Repeat phrase 0 points  Total Score 0 points    Immunizations Immunization History  Administered Date(s) Administered   PFIZER Comirnaty(Gray [place])Covid-19 Tri-Sucrose Vaccine 02/01/2020, 03/02/2020, 08/06/2020   Tdap 07/25/2012    TDAP status: Due, Education has been provided regarding the importance of this vaccine. Advised may receive this vaccine at local pharmacy or Health Dept. Aware to provide a copy of the vaccination record if obtained from local pharmacy or Health Dept. Verbalized acceptance and understanding.  Flu Vaccine status: Declined, Education has been provided regarding the importance of this vaccine but patient still declined.  Advised may receive this vaccine at local pharmacy or Health Dept. Aware to provide a copy of the vaccination record if obtained from local pharmacy or Health Dept. Verbalized acceptance and understanding.  Pneumococcal vaccine status: Declined,  Education has been provided regarding the importance of this vaccine but patient still declined. Advised may receive this vaccine at local pharmacy or Health Dept. Aware to provide a copy of the vaccination record if obtained from local pharmacy or Health Dept. Verbalized acceptance and understanding.   Covid-19 vaccine status: Completed vaccines  Qualifies for Shingles Vaccine? Yes   Zostavax completed No   Shingrix Completed?: No.    Education has been provided regarding the importance of this vaccine. Patient has been advised to call insurance company to determine out of pocket expense if they have not yet received this vaccine. Advised may also receive vaccine at local pharmacy or Health Dept. Verbalized acceptance and understanding.  Screening Tests Health Maintenance  Topic Date Due   Pneumonia Vaccine 69+ Years old (1 - PCV) Never done   FOOT EXAM  Never done   OPHTHALMOLOGY EXAM  Never done   Diabetic kidney evaluation - Urine ACR  Never done  Hepatitis C Screening  Never done   INFLUENZA VACCINE  Never done   COVID-19 Vaccine (4 - 2023-24 season) 04/09/2022   DTaP/Tdap/Td (2 - Td or Tdap) 07/25/2022   HEMOGLOBIN A1C  07/30/2022   Diabetic kidney evaluation - eGFR measurement  01/29/2023   Medicare Annual Wellness (AWV)  08/13/2023   COLONOSCOPY (Pts 45-90yr Insurance coverage will need to be confirmed)  09/04/2023   HPV VACCINES  Aged Out   Zoster Vaccines- Shingrix  Discontinued    Health Maintenance  Health Maintenance Due  Topic Date Due   Pneumonia Vaccine 72 Years old (1 - PCV) Never done   FOOT EXAM  Never done   OPHTHALMOLOGY EXAM  Never done   Diabetic kidney evaluation - Urine ACR  Never done   Hepatitis C Screening   Never done   INFLUENZA VACCINE  Never done   COVID-19 Vaccine (4 - 2023-24 season) 04/09/2022   DTaP/Tdap/Td (2 - Td or Tdap) 07/25/2022   HEMOGLOBIN A1C  07/30/2022    Colorectal cancer screening: Type of screening: Colonoscopy. Completed 09/03/2020. Repeat every 3 years  Lung Cancer Screening: (Low Dose CT Chest recommended if Age 71-80years, 30 pack-year currently smoking OR have quit w/in 15years.) does not qualify.   Lung Cancer Screening Referral: no  Additional Screening:  Hepatitis C Screening: does qualify; Completed: no  Vision Screening: Recommended annual ophthalmology exams for early detection of glaucoma and other disorders of the eye. Is the patient up to date with their annual eye exam?  Yes  Who is the provider or what is the name of the office in which the patient attends annual eye exams? [facility] If pt is not established with a provider, would they like to be referred to a provider to establish care? No .   Dental Screening: Recommended annual dental exams for proper oral hygiene  Community Resource Referral / Chronic Care Management: CRR required this visit?  No   CCM required this visit?  No      Plan:     I have personally reviewed and noted the following in the patient's chart:   Medical and social history Use of alcohol, tobacco or illicit drugs  Current medications and supplements including opioid prescriptions. Patient is not currently taking opioid prescriptions. Functional ability and status Nutritional status Physical activity Advanced directives List of other physicians Hospitalizations, surgeries, and ER visits in previous 12 months Vitals Screenings to include cognitive, depression, and falls Referrals and appointments  In addition, I have reviewed and discussed with patient certain preventive protocols, quality metrics, and best practice recommendations. A written personalized care plan for preventive services as well as general  preventive health recommendations were provided to patient.     SSheral Flow LPN   16/08/8483  Nurse Notes: N/A    Medical screening examination/treatment/procedure(s) were performed by non-physician practitioner and as supervising physician I was immediately available for consultation/collaboration.  I agree with above. ALew Dawes MD

## 2022-09-13 ENCOUNTER — Other Ambulatory Visit: Payer: Self-pay | Admitting: Cardiology

## 2022-09-29 ENCOUNTER — Other Ambulatory Visit: Payer: Self-pay | Admitting: Cardiology

## 2022-10-05 ENCOUNTER — Ambulatory Visit: Payer: Medicare Other

## 2022-10-05 DIAGNOSIS — I442 Atrioventricular block, complete: Secondary | ICD-10-CM | POA: Diagnosis not present

## 2022-10-05 LAB — CUP PACEART REMOTE DEVICE CHECK
Battery Remaining Longevity: 106 mo
Battery Remaining Percentage: 86 %
Battery Voltage: 3.02 V
Brady Statistic AP VP Percent: 1 %
Brady Statistic AP VS Percent: 4 %
Brady Statistic AS VP Percent: 1 %
Brady Statistic AS VS Percent: 96 %
Brady Statistic RA Percent Paced: 3.8 %
Brady Statistic RV Percent Paced: 1 %
Date Time Interrogation Session: 20240227020013
Implantable Lead Connection Status: 753985
Implantable Lead Connection Status: 753985
Implantable Lead Implant Date: 20220223
Implantable Lead Implant Date: 20220223
Implantable Lead Location: 753859
Implantable Lead Location: 753860
Implantable Pulse Generator Implant Date: 20220223
Lead Channel Impedance Value: 450 Ohm
Lead Channel Impedance Value: 510 Ohm
Lead Channel Pacing Threshold Amplitude: 0.75 V
Lead Channel Pacing Threshold Amplitude: 0.75 V
Lead Channel Pacing Threshold Pulse Width: 0.5 ms
Lead Channel Pacing Threshold Pulse Width: 0.5 ms
Lead Channel Sensing Intrinsic Amplitude: 12 mV
Lead Channel Sensing Intrinsic Amplitude: 2.9 mV
Lead Channel Setting Pacing Amplitude: 2 V
Lead Channel Setting Pacing Amplitude: 2.5 V
Lead Channel Setting Pacing Pulse Width: 0.5 ms
Lead Channel Setting Sensing Sensitivity: 2 mV
Pulse Gen Model: 2272
Pulse Gen Serial Number: 3900218

## 2022-11-10 NOTE — Progress Notes (Signed)
Remote pacemaker transmission.   

## 2022-12-16 ENCOUNTER — Other Ambulatory Visit: Payer: Self-pay | Admitting: Cardiology

## 2022-12-16 DIAGNOSIS — I2510 Atherosclerotic heart disease of native coronary artery without angina pectoris: Secondary | ICD-10-CM

## 2022-12-16 DIAGNOSIS — I10 Essential (primary) hypertension: Secondary | ICD-10-CM

## 2023-01-04 ENCOUNTER — Ambulatory Visit (INDEPENDENT_AMBULATORY_CARE_PROVIDER_SITE_OTHER): Payer: Medicare Other

## 2023-01-04 DIAGNOSIS — I442 Atrioventricular block, complete: Secondary | ICD-10-CM

## 2023-01-05 LAB — CUP PACEART REMOTE DEVICE CHECK
Battery Remaining Longevity: 103 mo
Battery Remaining Percentage: 84 %
Battery Voltage: 3.02 V
Brady Statistic AP VP Percent: 1 %
Brady Statistic AP VS Percent: 4.2 %
Brady Statistic AS VP Percent: 1 %
Brady Statistic AS VS Percent: 96 %
Brady Statistic RA Percent Paced: 4 %
Brady Statistic RV Percent Paced: 1 %
Date Time Interrogation Session: 20240528020018
Implantable Lead Connection Status: 753985
Implantable Lead Connection Status: 753985
Implantable Lead Implant Date: 20220223
Implantable Lead Implant Date: 20220223
Implantable Lead Location: 753859
Implantable Lead Location: 753860
Implantable Pulse Generator Implant Date: 20220223
Lead Channel Impedance Value: 450 Ohm
Lead Channel Impedance Value: 480 Ohm
Lead Channel Pacing Threshold Amplitude: 0.75 V
Lead Channel Pacing Threshold Amplitude: 0.75 V
Lead Channel Pacing Threshold Pulse Width: 0.5 ms
Lead Channel Pacing Threshold Pulse Width: 0.5 ms
Lead Channel Sensing Intrinsic Amplitude: 12 mV
Lead Channel Sensing Intrinsic Amplitude: 2.2 mV
Lead Channel Setting Pacing Amplitude: 2 V
Lead Channel Setting Pacing Amplitude: 2.5 V
Lead Channel Setting Pacing Pulse Width: 0.5 ms
Lead Channel Setting Sensing Sensitivity: 2 mV
Pulse Gen Model: 2272
Pulse Gen Serial Number: 3900218

## 2023-01-26 ENCOUNTER — Ambulatory Visit: Payer: Medicare Other | Attending: Physician Assistant | Admitting: Physician Assistant

## 2023-01-26 ENCOUNTER — Encounter: Payer: Self-pay | Admitting: Physician Assistant

## 2023-01-28 NOTE — Progress Notes (Signed)
This encounter was created in error - please disregard.

## 2023-01-28 NOTE — Progress Notes (Signed)
Remote pacemaker transmission.   

## 2023-04-05 ENCOUNTER — Ambulatory Visit (INDEPENDENT_AMBULATORY_CARE_PROVIDER_SITE_OTHER): Payer: Medicare Other

## 2023-04-05 DIAGNOSIS — I442 Atrioventricular block, complete: Secondary | ICD-10-CM | POA: Diagnosis not present

## 2023-04-05 LAB — CUP PACEART REMOTE DEVICE CHECK
Battery Remaining Longevity: 100 mo
Battery Remaining Percentage: 82 %
Battery Voltage: 3.02 V
Brady Statistic AP VP Percent: 1 %
Brady Statistic AP VS Percent: 5.3 %
Brady Statistic AS VP Percent: 1 %
Brady Statistic AS VS Percent: 95 %
Brady Statistic RA Percent Paced: 5.1 %
Brady Statistic RV Percent Paced: 1 %
Date Time Interrogation Session: 20240827031629
Implantable Lead Connection Status: 753985
Implantable Lead Connection Status: 753985
Implantable Lead Implant Date: 20220223
Implantable Lead Implant Date: 20220223
Implantable Lead Location: 753859
Implantable Lead Location: 753860
Implantable Pulse Generator Implant Date: 20220223
Lead Channel Impedance Value: 450 Ohm
Lead Channel Impedance Value: 480 Ohm
Lead Channel Pacing Threshold Amplitude: 0.75 V
Lead Channel Pacing Threshold Amplitude: 0.75 V
Lead Channel Pacing Threshold Pulse Width: 0.5 ms
Lead Channel Pacing Threshold Pulse Width: 0.5 ms
Lead Channel Sensing Intrinsic Amplitude: 12 mV
Lead Channel Sensing Intrinsic Amplitude: 2.9 mV
Lead Channel Setting Pacing Amplitude: 2 V
Lead Channel Setting Pacing Amplitude: 2.5 V
Lead Channel Setting Pacing Pulse Width: 0.5 ms
Lead Channel Setting Sensing Sensitivity: 2 mV
Pulse Gen Model: 2272
Pulse Gen Serial Number: 3900218

## 2023-04-18 NOTE — Progress Notes (Signed)
Remote pacemaker transmission.   

## 2023-05-10 ENCOUNTER — Other Ambulatory Visit: Payer: Self-pay

## 2023-05-10 ENCOUNTER — Other Ambulatory Visit: Payer: Self-pay | Admitting: Cardiology

## 2023-05-10 MED ORDER — RANOLAZINE ER 500 MG PO TB12: ORAL_TABLET | ORAL | 0 refills | Status: DC

## 2023-05-21 ENCOUNTER — Other Ambulatory Visit: Payer: Self-pay | Admitting: Cardiology

## 2023-05-25 ENCOUNTER — Encounter (HOSPITAL_BASED_OUTPATIENT_CLINIC_OR_DEPARTMENT_OTHER): Payer: Self-pay | Admitting: Emergency Medicine

## 2023-05-25 ENCOUNTER — Other Ambulatory Visit: Payer: Self-pay

## 2023-05-25 ENCOUNTER — Emergency Department (HOSPITAL_BASED_OUTPATIENT_CLINIC_OR_DEPARTMENT_OTHER)
Admission: EM | Admit: 2023-05-25 | Discharge: 2023-05-25 | Disposition: A | Discharge status: Home / Self Care | Payer: No Typology Code available for payment source | Attending: Emergency Medicine | Admitting: Emergency Medicine

## 2023-05-25 DIAGNOSIS — M5431 Sciatica, right side: Secondary | ICD-10-CM

## 2023-05-25 DIAGNOSIS — Z7982 Long term (current) use of aspirin: Secondary | ICD-10-CM | POA: Insufficient documentation

## 2023-05-25 DIAGNOSIS — M5441 Lumbago with sciatica, right side: Secondary | ICD-10-CM | POA: Diagnosis not present

## 2023-05-25 DIAGNOSIS — M545 Low back pain, unspecified: Secondary | ICD-10-CM | POA: Diagnosis present

## 2023-05-25 MED ORDER — HYDROCODONE-ACETAMINOPHEN 5-325 MG PO TABS: 1.0000 | ORAL_TABLET | Freq: Four times a day (QID) | ORAL | 0 refills | Status: DC | PRN

## 2023-05-25 MED ORDER — PREDNISONE 10 MG PO TABS
60.0000 mg | ORAL_TABLET | Freq: Once | ORAL | Status: AC
  Administered 2023-05-25: 60 mg via ORAL
  Filled 2023-05-25: qty 1

## 2023-05-25 MED ORDER — ACETAMINOPHEN 500 MG PO TABS
500.0000 mg | ORAL_TABLET | Freq: Once | ORAL | Status: AC
Start: 2023-05-25 — End: 2023-05-25
  Administered 2023-05-25: 500 mg via ORAL
  Filled 2023-05-25: qty 1

## 2023-05-25 MED ORDER — METHYLPREDNISOLONE 4 MG PO TBPK: ORAL_TABLET | ORAL | 0 refills | Status: DC

## 2023-05-25 MED ORDER — HYDROCODONE-ACETAMINOPHEN 5-325 MG PO TABS
1.0000 | ORAL_TABLET | Freq: Once | ORAL | Status: AC
  Administered 2023-05-25: 1 via ORAL
  Filled 2023-05-25: qty 1

## 2023-05-25 NOTE — ED Triage Notes (Signed)
Pt c/o lower back pain x 2 wks, no injury, difficulty walking d/t pain

## 2023-05-25 NOTE — Discharge Instructions (Signed)
1.  This time your symptoms are consistent with sciatica.  You can get inflammation in the sciatic joint in the lower back and this can put pressure on a nerve that shoots pain down your buttock and may even shoot down your leg to your foot. 2.  Have been given a course of steroids to take.  This is your Medrol Dosepak.  Start it tomorrow morning as prescribed.  You may take 1-2 Vicodin tablets every 6 hours as needed for additional pain control.  As soon as your pain is starting to improve, decrease your use of Vicodin and start substituting plain Tylenol. 3.  You may apply over-the-counter pain patches such as Salonpas or Lidoderm.  Also warm moist heat may feel good. 4.  Schedule follow-up with your doctor for recheck within the next 4 to 7 days.  Return to the emergency department if you have weakness or numbness in your leg, dysfunction of your bowel or your bladder or other concerning symptoms.

## 2023-05-25 NOTE — ED Provider Notes (Signed)
[facility] Provider Note   CSN: [id number] Arrival date & time: 05/25/23  1535     History  Chief Complaint  Patient presents with   Back Pain    Anthony Chapman is a 72 y.o. male.  HPI Patient is been having pain in his right lower back for several days.  He denies any injury or fall.  Reports a couple days ago he was having some aching and he indicates his right SI region.  He reports with certain movements it was radiating toward the buttock.  He reports he was taking some Tylenol and putting some warm heat on it and it seemed to be getting better but then today it flared again and with movements particularly going from sitting to standing or walking it was shooting into his buttock.  He denies weakness or numbness into the leg but today started using a cane because of the pain.  Patient reports once before many years ago he had some low back pain but it resolved pretty quickly.  No history of chronic back pain.  Denies any urinary symptoms.  No pain no burning no blood.    Home Medications Prior to Admission medications   Medication Sig Start Date End Date Taking? Authorizing Provider  HYDROcodone-acetaminophen (NORCO/VICODIN) 5-325 MG tablet Take 1-2 tablets by mouth every 6 (six) hours as needed. 05/25/23  Yes Arby Barrette, MD  methylPREDNISolone (MEDROL DOSEPAK) 4 MG TBPK tablet Per dose pack instruction 05/25/23  Yes Alyra Patty, Lebron Conners, MD  amLODipine (NORVASC) 10 MG tablet TAKE 1 TABLET BY MOUTH DAILY 09/16/22   [place], Peter M, MD  amoxicillin (AMOXIL) 500 MG capsule Take 500 mg by mouth 3 (three) times daily. 07/22/22   [provider]  aspirin 81 MG EC tablet Take 1 tablet (81 mg total) by mouth daily. 10/06/16   Azalee Course, PA  carvedilol (COREG) 25 MG tablet TAKE 1 TABLET BY MOUTH TWICE  DAILY WITH MEALS 05/23/23   [place], Peter M, MD  cetirizine (ZYRTEC) 10 MG tablet Take 1 tablet (10 mg total) by mouth daily as needed  for allergies. 01/28/22   Plotnikov, Georgina Quint, MD  ezetimibe (ZETIA) 10 MG tablet TAKE 1 TABLET BY MOUTH DAILY 05/23/23   [place], Peter M, MD  isosorbide mononitrate (IMDUR) 60 MG 24 hr tablet TAKE 1 TABLET BY MOUTH DAILY 12/16/22   [place], Peter M, MD  montelukast (SINGULAIR) 10 MG tablet Take 10 mg by mouth daily as needed (allergies).    [provider]  nitroGLYCERIN (NITROSTAT) 0.4 MG SL tablet DISSOLVE 1 TABLET UNDER THE  TONGUE EVERY 5 MINUTES AS NEEDED FOR CHEST PAIN. MAX OF 3 TABLETS IN 15 MINUTES. CALL 911 IF PAIN  PERSISTS. 05/11/23   [place], Peter M, MD  ranolazine (RANEXA) 500 MG 12 hr tablet TAKE 2 TABLETS BY MOUTH TWICE  DAILY 05/10/23   [place], Peter M, MD  rosuvastatin (CRESTOR) 40 MG tablet TAKE 1 TABLET BY MOUTH DAILY 05/23/23   [place], Peter M, MD  spironolactone (ALDACTONE) 25 MG tablet TAKE ONE-HALF TABLET BY MOUTH  DAILY 09/16/22   [place], Peter M, MD  VITAMIN D, CHOLECALCIFEROL, PO Take 1 tablet by mouth daily as needed (vitamin d supplement).    [provider]      Allergies    Losartan    Review of Systems   Review of Systems  Physical Exam Updated Vital Signs BP (!) 162/83 (BP Location: Right Arm)   Pulse 79  Temp 98.4 F (36.9 C) (Oral)   Resp 16   Ht 5\' 9"  (1.753 m)   Wt 113.9 kg   SpO2 97%   BMI 37.07 kg/m  Physical Exam Constitutional:      Comments: Patient is alert and nontoxic.  Clear mental status.  No respiratory distress.  HENT:     Mouth/Throat:     Pharynx: Oropharynx is clear.  Eyes:     Extraocular Movements: Extraocular movements intact.  Cardiovascular:     Rate and Rhythm: Normal rate and regular rhythm.  Pulmonary:     Effort: Pulmonary effort is normal.     Breath sounds: Normal breath sounds.  Abdominal:     General: There is no distension.     Palpations: Abdomen is soft.     Tenderness: There is no abdominal tenderness. There is no guarding.  Musculoskeletal:     Comments: Patient indicates area of pain is  right over the right SI joint.  No focal bony point tenderness.  Patient does have reproduction of pain going from sitting to standing.  However he can bear weight and ambulate with slightly antalgic gait.  No weakness.  No lower extremity edema or calf tenderness.  Skin:    General: Skin is warm and dry.  Neurological:     General: No focal deficit present.     Mental Status: He is oriented to person, place, and time.     Motor: No weakness.     Coordination: Coordination normal.  Psychiatric:        Mood and Affect: Mood normal.     ED Results / Procedures / Treatments   Labs (all labs ordered are listed, but only abnormal results are displayed) Labs Reviewed - No data to display  EKG None  Radiology No results found.  Procedures Procedures    Medications Ordered in ED Medications  predniSONE (DELTASONE) tablet 60 mg (60 mg Oral Given 05/25/23 2025)  HYDROcodone-acetaminophen (NORCO/VICODIN) 5-325 MG per tablet 1 tablet (1 tablet Oral Given 05/25/23 2025)  acetaminophen (TYLENOL) tablet 500 mg (500 mg Oral Given 05/25/23 2025)    ED Course/ Medical Decision Making/ A&P                                 Medical Decision Making Risk OTC drugs. Prescription drug management.   Patient presents as outlined.  He has low back pain local to the right SI joint region.  With certain positions he gets radiation of pain just to the top of the buttock.  He has no neurologic dysfunction.  There was no significant injury.  Patient has sporadically had some low back pain that resolved spontaneously.  At this time, low suspicion for significant nerve impingement\spinal cord impingement\infectious process or cauda equina.  With patient having intact neurologic function and focal typical pain for sciatica, I do not feel that other imaging is indicated tonight.  We reviewed return precautions and follow-up plan.  At this time plan will be to treat with a course of steroids, patient denies  any history of diabetes.  Vicodin prescribed for more acute pain control.  Patient is counseled on use of Vicodin and tapering as soon as possible.  He and his family member voiced understanding.  Turn precautions reviewed.        Final Clinical Impression(s) / ED Diagnoses Final diagnoses:  Sciatica of right side    Rx / DC Orders ED  Discharge Orders          Ordered    methylPREDNISolone (MEDROL DOSEPAK) 4 MG TBPK tablet        05/25/23 2103    HYDROcodone-acetaminophen (NORCO/VICODIN) 5-325 MG tablet  Every 6 hours PRN        05/25/23 2103              Arby Barrette, MD 05/25/23 2107

## 2023-06-01 ENCOUNTER — Other Ambulatory Visit: Payer: Self-pay | Admitting: Cardiology

## 2023-06-08 ENCOUNTER — Encounter: Payer: Self-pay | Admitting: Internal Medicine

## 2023-06-08 ENCOUNTER — Telehealth (INDEPENDENT_AMBULATORY_CARE_PROVIDER_SITE_OTHER): Payer: Medicare Other | Admitting: Internal Medicine

## 2023-06-08 DIAGNOSIS — I10 Essential (primary) hypertension: Secondary | ICD-10-CM | POA: Diagnosis not present

## 2023-06-08 DIAGNOSIS — M5431 Sciatica, right side: Secondary | ICD-10-CM | POA: Diagnosis not present

## 2023-06-08 DIAGNOSIS — Z9861 Coronary angioplasty status: Secondary | ICD-10-CM

## 2023-06-08 DIAGNOSIS — I2510 Atherosclerotic heart disease of native coronary artery without angina pectoris: Secondary | ICD-10-CM | POA: Diagnosis not present

## 2023-06-08 MED ORDER — IBUPROFEN 600 MG PO TABS: ORAL_TABLET | ORAL | 1 refills | Status: AC

## 2023-06-08 NOTE — Progress Notes (Signed)
Virtual Visit via Video Note  I connected with Anthony Chapman on 06/08/23 at  8:50 AM EDT by a video enabled telemedicine application and verified that I am speaking with the correct person using two identifiers.   I discussed the limitations of evaluation and management by telemedicine and the availability of in person appointments. The patient expressed understanding and agreed to proceed.  I was located at our [facility] office. The patient was at home. There was no one else present in the visit.  Chief Complaint  Patient presents with   Follow-up     History of Present Illness: This is a follow-up visit for sciatica/low back pain on the right.  Anthony Chapman had a visit to ER on 05/25/2023 for this problem.  He was given prednisone, ibuprofen and hydrocodone.  He is 80% better  Review of Systems  Constitutional:  Negative for fever.  Cardiovascular:  Negative for chest pain and palpitations.  Musculoskeletal:  Positive for back pain. Negative for myalgias and neck pain.     Observations/Objective: The patient appears to be in no acute distress  Assessment and Plan:  Problem List Items Addressed This Visit     Essential hypertension (Chronic)    Diet discussed Monitor blood pressure at home      CAD S/P percutaneous coronary angioplasty - 2015 PCI to RCA (2.5 x 14 Resolute DES) & Cx (3 x 12 Resolute DES) (Chronic)    No chest pain.      Sciatica of right side - Primary    Anthony Chapman had a visit to ER on 05/25/2023 for this problem.  He was given prednisone, ibuprofen and hydrocodone.  He is 80% better. Continue stretching exercise, heat.  Use ibuprofen 600 mg p.o. as needed back pain/sciatica. Call/office visit if problems        Meds ordered this encounter  Medications   ibuprofen (ADVIL) 600 MG tablet    Sig: Take 1 twice a day with food as needed for pain    Dispense:  60 tablet    Refill:  1     Follow Up Instructions:    I discussed the assessment and  treatment plan with the patient. The patient was provided an opportunity to ask questions and all were answered. The patient agreed with the plan and demonstrated an understanding of the instructions.   The patient was advised to call back or seek an in-person evaluation if the symptoms worsen or if the condition fails to improve as anticipated.  I provided face-to-face time during this encounter. We were at different locations.   Sonda Primes, MD

## 2023-06-08 NOTE — Assessment & Plan Note (Signed)
No chest pain

## 2023-06-08 NOTE — Assessment & Plan Note (Addendum)
Anthony Chapman had a visit to ER on 05/25/2023 for this problem.  He was given prednisone, ibuprofen and hydrocodone.  He is 80% better. Continue stretching exercise, heat.  Use ibuprofen 600 mg p.o. as needed back pain/sciatica. Call/office visit if problems

## 2023-06-08 NOTE — Assessment & Plan Note (Signed)
Diet discussed Monitor blood pressure at home

## 2023-07-05 ENCOUNTER — Ambulatory Visit (INDEPENDENT_AMBULATORY_CARE_PROVIDER_SITE_OTHER): Payer: Medicare Other

## 2023-07-05 DIAGNOSIS — I442 Atrioventricular block, complete: Secondary | ICD-10-CM | POA: Diagnosis not present

## 2023-07-05 LAB — CUP PACEART REMOTE DEVICE CHECK
Battery Remaining Longevity: 97 mo
Battery Remaining Percentage: 80 %
Battery Voltage: 3.02 V
Brady Statistic AP VP Percent: 1 %
Brady Statistic AP VS Percent: 5.4 %
Brady Statistic AS VP Percent: 1 %
Brady Statistic AS VS Percent: 94 %
Brady Statistic RA Percent Paced: 5.2 %
Brady Statistic RV Percent Paced: 1 %
Date Time Interrogation Session: 20241126025425
Implantable Lead Connection Status: 753985
Implantable Lead Connection Status: 753985
Implantable Lead Implant Date: 20220223
Implantable Lead Implant Date: 20220223
Implantable Lead Location: 753859
Implantable Lead Location: 753860
Implantable Pulse Generator Implant Date: 20220223
Lead Channel Impedance Value: 450 Ohm
Lead Channel Impedance Value: 450 Ohm
Lead Channel Pacing Threshold Amplitude: 0.75 V
Lead Channel Pacing Threshold Amplitude: 0.75 V
Lead Channel Pacing Threshold Pulse Width: 0.5 ms
Lead Channel Pacing Threshold Pulse Width: 0.5 ms
Lead Channel Sensing Intrinsic Amplitude: 12 mV
Lead Channel Sensing Intrinsic Amplitude: 3.1 mV
Lead Channel Setting Pacing Amplitude: 2 V
Lead Channel Setting Pacing Amplitude: 2.5 V
Lead Channel Setting Pacing Pulse Width: 0.5 ms
Lead Channel Setting Sensing Sensitivity: 2 mV
Pulse Gen Model: 2272
Pulse Gen Serial Number: 3900218

## 2023-07-12 ENCOUNTER — Other Ambulatory Visit: Payer: Self-pay | Admitting: Cardiology

## 2023-08-05 NOTE — Progress Notes (Signed)
Remote pacemaker transmission.   

## 2023-08-26 ENCOUNTER — Ambulatory Visit: Payer: Medicare Other | Admitting: Primary Care

## 2023-08-31 ENCOUNTER — Other Ambulatory Visit: Payer: Self-pay | Admitting: Cardiology

## 2023-09-01 ENCOUNTER — Encounter: Payer: Self-pay | Admitting: Nurse Practitioner

## 2023-09-01 ENCOUNTER — Ambulatory Visit: Payer: Medicare Other | Admitting: Nurse Practitioner

## 2023-09-01 ENCOUNTER — Ambulatory Visit: Payer: Medicare Other | Admitting: Primary Care

## 2023-09-01 VITALS — BP 130/72 | HR 76 | Temp 98.1°F | Ht 69.0 in | Wt 255.0 lb

## 2023-09-01 DIAGNOSIS — E66812 Obesity, class 2: Secondary | ICD-10-CM

## 2023-09-01 DIAGNOSIS — F5101 Primary insomnia: Secondary | ICD-10-CM

## 2023-09-01 DIAGNOSIS — G4733 Obstructive sleep apnea (adult) (pediatric): Secondary | ICD-10-CM | POA: Diagnosis not present

## 2023-09-01 MED ORDER — RAMELTEON 8 MG PO TABS: 8.0000 mg | ORAL_TABLET | Freq: Every day | ORAL | 1 refills | Status: DC

## 2023-09-01 NOTE — Assessment & Plan Note (Signed)
BMI 37. Healthy weight loss encouraged.  

## 2023-09-01 NOTE — Assessment & Plan Note (Addendum)
Moderate OSA on CPAP. Well controlled on current settings. Suboptimal compliance partly related to insomniac symptoms. Difficulties with sleep onset. No specific concerns regarding maintenance. Will trial him on ramelteon and reassess response. Side effect profile reviewed. Aware to notify if this is not covered and we can try an alternative. Sleep hygiene reviewed. Risks of untreated OSA addressed. Could consider oral appliance or hypoglossal nerve stimulator if he continues to have difficulties with compliance despite improvement in sleep. Aware of proper care/use of device. Safe driving practices reviewed. Healthy weight loss encouraged.  Patient Instructions  Increase use of CPAP every night, minimum of 4-6 hours a night.  Change equipment as directed. Wash your tubing with warm soap and water daily, hang to dry. Wash humidifier portion weekly. Use bottled, distilled water and change daily Be aware of reduced alertness and do not drive or operate heavy machinery if experiencing this or drowsiness.  Exercise encouraged, as tolerated. Healthy weight management discussed.  Avoid or decrease alcohol consumption and medications that make you more sleepy, if possible. Notify if persistent daytime sleepiness occurs even with consistent use of PAP therapy.  Start ramelteon 8 mg At bedtime as needed for sleep. Take immediately before bed. Ensure you have 7-8 hours in the bed after taking. Monitor for any mood changes or changes in sleep habits. Stop and notify immediately if these occur. Do not drive or operate heavy machinery after taking. Do not take with alcohol or other sedating medications. Ensure you apply your CPAP within 5-10 minutes of taking to avoid falling asleep without it. May cause some morning grogginess or vivid dreams.   If this doesn't help you fall and stay asleep, we can try an alternative medication  We discussed how untreated sleep apnea puts an individual at risk for cardiac  arrhthymias, pulm HTN, DM, stroke and increases their risk for daytime accidents. We also briefly reviewed treatment options including weight loss, side sleeping position, oral appliance, CPAP therapy or referral to ENT for possible surgical options  We could look at [facility] if you continue to have trouble; however, it sounds like it's more so related to you getting to sleep so let's see how the sleep aid goes first   Let us or you primary care doctor know if the muscle pain in your chest/side doesn't get better. If it gets significant worse, go to the emergency department.   Follow up in 6 weeks with Waynetta Sandy NP or Florentina Addison NP. If symptoms do not improve or worsen, please contact office for sooner follow up or seek emergency care.

## 2023-09-01 NOTE — Progress Notes (Signed)
@Patient  ID: Anthony Chapman, male    DOB: [birth year], 73 y.o.   MRN: [id number]  Chief Complaint  Patient presents with   Follow-up    Referring provider: Plotnikov, Georgina Quint, MD  HPI: 73 year old, former smoker followed for OSA on CPAP. He was last seen by Clent Ridges NP 04/16/2022. Past medical history significant for CAD s/p PCI, HTN, LBBB, ED, HLD, insomnia, obesity, symptomatic bradycardia s/p pacemaker.   TEST/EVENTS:  09/2016 HST: AHI 16/h  04/16/2022: Sudie Grumbling with Clent Ridges NP. Hx of OSA with AHI 16/h on CPAP. Compliance fair. He uses cradle mask. Can't wear full face d/t PTSD. Sometimes nasal mask irritates mole on his nose. Going to see about having this removed. Encouraged to increase usage as much as possible.  09/01/2023: Today - follow up Patient presents today for follow-up.  He has been doing about the same with his CPAP since he was here last.  When he does wear it, he tends to feel better rested and sleeps better at night.  Energy levels are better the next day.  His main problem is that he has difficulties falling asleep so a lot of nights he either does not sleep or only gets a few hours.  He does nap some during the day.  Does not use his CPAP during these naps.  Mask fit is fine.  Not having any issues with leaks or pressure settings.  He is not currently on any sleep aids.  Does have a history of PTSD but does not necessarily feel like this is causing the trouble with his sleep.  Occasionally feels like his mind races.  No issues with depression or SI.  No issues with drowsy driving or morning headaches.  No sleep parasomnia/paralysis.  They have been moving and he's been lifting/rearranging things. He noticed earlier this week, he developed some discomfort on his right side/chest that radiated to his back. It's improved but still having some residual right chest discomfort that gets worse with certain movements. He can also press on the area and it's sore. He denies any increased shortness  of breath, wheezing, cough, palpitations, lightheadedness/dizziness.   06/01/2023-08/29/2023: CPAP 5-15 cmH2O 50/90 days; 38% >4 hr; average use 5 hours 11 minutes Pressure 95th 11 Leaks 95th 6 AHI 2.1  Allergies  Allergen Reactions   Losartan Other (See Comments)    angioedema    Immunization History  Administered Date(s) Administered   PFIZER Comirnaty(Gray Top)Covid-19 Tri-Sucrose Vaccine 02/01/2020, 03/02/2020, 08/06/2020   Tdap 07/25/2012    Past Medical History:  Diagnosis Date   Allergy    Spring- mild    Anginal pain (HCC)    Arthritis    "all over" (08/27/2016)   CAD (coronary artery disease)    a.  stent placement to the RCA in 2010 b. cath in 2015 with PCI to the RCA (2.33mm x99mm Resolute DES) and LCx (3.34mm x62mm Resolute DES).  c. 08/2016: cath showing 100% ISR of d-RCA with collaterals, 10% Ost LAD ISR, 90% mid-Cx stenosis (3.0x59mm Resolute DES placed)   ED (erectile dysfunction)    GERD (gastroesophageal reflux disease)    HTN (hypertension)    Hyperlipemia    LBBB (left bundle branch block)    Myocardial infarction (HCC) 2010; 2015   Sleep apnea    wears cpap     Tobacco History: Social History   Tobacco Use  Smoking Status Former   Current packs/day: 0.00   Average packs/day: 1 pack/day for 30.0 years (30.0 ttl pk-yrs)  Types: Cigarettes, Cigars   Start date: 08/16/1968   Quit date: 08/16/1998   Years since quitting: 25.0  Smokeless Tobacco Never   Counseling given: Not Answered   Outpatient Medications Prior to Visit  Medication Sig Dispense Refill   amLODipine (NORVASC) 10 MG tablet TAKE 1 TABLET BY MOUTH DAILY 90 tablet 2   aspirin 81 MG EC tablet Take 1 tablet (81 mg total) by mouth daily. 30 tablet 0   carvedilol (COREG) 25 MG tablet TAKE 1 TABLET BY MOUTH TWICE  DAILY WITH MEALS 30 tablet 0   cetirizine (ZYRTEC) 10 MG tablet Take 1 tablet (10 mg total) by mouth daily as needed for allergies. 90 tablet 3   ezetimibe (ZETIA) 10 MG tablet  TAKE 1 TABLET BY MOUTH DAILY 15 tablet 0   HYDROcodone-acetaminophen (NORCO/VICODIN) 5-325 MG tablet Take 1-2 tablets by mouth every 6 (six) hours as needed. 20 tablet 0   ibuprofen (ADVIL) 600 MG tablet Take 1 twice a day with food as needed for pain 60 tablet 1   isosorbide mononitrate (IMDUR) 60 MG 24 hr tablet TAKE 1 TABLET BY MOUTH DAILY 90 tablet 1   methylPREDNISolone (MEDROL DOSEPAK) 4 MG TBPK tablet Per dose pack instruction 21 tablet 0   montelukast (SINGULAIR) 10 MG tablet Take 10 mg by mouth daily as needed (allergies).     nitroGLYCERIN (NITROSTAT) 0.4 MG SL tablet DISSOLVE 1 TABLET UNDER THE  TONGUE EVERY 5 MINUTES AS NEEDED FOR CHEST PAIN. MAX OF 3 TABLETS IN 15 MINUTES. CALL 911 IF PAIN  PERSISTS. (SCHEDULE APPT) 25 tablet 0   ranolazine (RANEXA) 500 MG 12 hr tablet TAKE 2 TABLETS BY MOUTH TWICE  DAILY PT. MUST MAKE AN APPOINTMENT IN ORDER TO RECEIVE FUTURE REFILLS. FIRST ATTEMPT. 60 tablet 0   rosuvastatin (CRESTOR) 40 MG tablet TAKE 1 TABLET BY MOUTH DAILY 15 tablet 0   spironolactone (ALDACTONE) 25 MG tablet TAKE ONE-HALF TABLET BY MOUTH  DAILY 45 tablet 6   VITAMIN D, CHOLECALCIFEROL, PO Take 1 tablet by mouth daily as needed (vitamin d supplement).     No facility-administered medications prior to visit.     Review of Systems:   Constitutional: No weight loss or gain, night sweats, fevers, chills,or lassitude. +fatigue  HEENT: No headaches, difficulty swallowing, tooth/dental problems, or sore throat. No sneezing, itching, ear ache, nasal congestion, or post nasal drip CV:  No chest pain, orthopnea, PND, swelling in lower extremities, anasarca, dizziness, palpitations, syncope Resp: +snoring without CPAP. No shortness of breath with exertion or at rest. No excess mucus or change in color of mucus. No productive or non-productive. No hemoptysis. No wheezing.  No chest wall deformity GI:  No heartburn, indigestion GU: No nocturia  Skin: No rash, lesions, ulcerations MSK:   No joint pain or swelling.  +right chest/side soreness  Neuro: No dizziness or lightheadedness.  Psych: No depression or anxiety. Mood stable. +sleep disturbance     Physical Exam:  BP 130/72 (BP Location: Right Arm, Patient Position: Sitting, Cuff Size: Large)   Pulse 76   Temp 98.1 F (36.7 C) (Oral)   Ht 5\' 9"  (1.753 m)   Wt 255 lb (115.7 kg)   SpO2 96%   BMI 37.66 kg/m   GEN: Pleasant, interactive, well-appearing; obese; in no acute distress HEENT:  Normocephalic and atraumatic. PERRLA. Sclera white. Nasal turbinates pink, moist and patent bilaterally. No rhinorrhea present. Oropharynx pink and moist, without exudate or edema. No lesions, ulcerations, or postnasal drip. Mallampati III NECK:  Supple w/ fair ROM. No JVD present. Normal carotid impulses w/o bruits. Thyroid symmetrical with no goiter or nodules palpated. No lymphadenopathy.   CV: RRR, no m/r/g, no peripheral edema. Pulses intact, +2 bilaterally. No cyanosis, pallor or clubbing. PULMONARY:  Unlabored, regular breathing. Clear bilaterally A&P w/o wheezes/rales/rhonchi. No accessory muscle use.  GI: BS present and normoactive. Soft, non-tender to palpation. No organomegaly or masses detected.  MSK: No erythema, warmth. Mild tenderness to anterior right chest wall upon palpation. No bony crepitus. Cap refil <2 sec all extrem. No deformities or joint swelling noted.  Neuro: A/Ox3. No focal deficits noted.   Skin: Warm, no lesions or rashe Psych: Normal affect and behavior. Judgement and thought content appropriate.     Lab Results:  CBC    Component Value Date/Time   WBC 4.6 01/28/2022 1122   RBC 4.67 01/28/2022 1122   HGB 12.6 (L) 01/28/2022 1122   HGB 13.1 05/19/2020 1253   HCT 38.7 (L) 01/28/2022 1122   HCT 39.9 05/19/2020 1253   PLT 142.0 (L) 01/28/2022 1122   PLT 159 05/19/2020 1253   MCV 83.0 01/28/2022 1122   MCV 80 05/19/2020 1253   MCH 26.4 10/01/2020 0035   MCHC 32.5 01/28/2022 1122   RDW 15.0  01/28/2022 1122   RDW 13.7 05/19/2020 1253   LYMPHSABS 1.0 01/28/2022 1122   LYMPHSABS 1.5 09/07/2019 1648   MONOABS 0.6 01/28/2022 1122   EOSABS 0.3 01/28/2022 1122   EOSABS 0.4 09/07/2019 1648   BASOSABS 0.0 01/28/2022 1122   BASOSABS 0.1 09/07/2019 1648    BMET    Component Value Date/Time   NA 137 01/28/2022 1122   NA 137 09/04/2021 1503   K 4.1 01/28/2022 1122   CL 102 01/28/2022 1122   CO2 28 01/28/2022 1122   GLUCOSE 121 (H) 01/28/2022 1122   BUN 15 01/28/2022 1122   BUN 14 09/04/2021 1503   CREATININE 1.04 01/28/2022 1122   CREATININE 0.96 12/07/2016 1516   CALCIUM 9.0 01/28/2022 1122   GFRNONAA >60 10/01/2020 0035   GFRAA 106 05/19/2020 1253    BNP    Component Value Date/Time   BNP 75.4 10/01/2020 0035     Imaging:  No results found.  Administration History     None           No data to display          No results found for: "NITRICOXIDE"      Assessment & Plan:   OSA (obstructive sleep apnea) Moderate OSA on CPAP. Well controlled on current settings. Suboptimal compliance partly related to insomniac symptoms. Difficulties with sleep onset. No specific concerns regarding maintenance. Will trial him on ramelteon and reassess response. Side effect profile reviewed. Aware to notify if this is not covered and we can try an alternative. Sleep hygiene reviewed. Risks of untreated OSA addressed. Could consider oral appliance or hypoglossal nerve stimulator if he continues to have difficulties with compliance despite improvement in sleep. Aware of proper care/use of device. Safe driving practices reviewed. Healthy weight loss encouraged.  Patient Instructions  Increase use of CPAP every night, minimum of 4-6 hours a night.  Change equipment as directed. Wash your tubing with warm soap and water daily, hang to dry. Wash humidifier portion weekly. Use bottled, distilled water and change daily Be aware of reduced alertness and do not drive or operate  heavy machinery if experiencing this or drowsiness.  Exercise encouraged, as tolerated. Healthy weight management discussed.  Avoid or decrease  alcohol consumption and medications that make you more sleepy, if possible. Notify if persistent daytime sleepiness occurs even with consistent use of PAP therapy.  Start ramelteon 8 mg At bedtime as needed for sleep. Take immediately before bed. Ensure you have 7-8 hours in the bed after taking. Monitor for any mood changes or changes in sleep habits. Stop and notify immediately if these occur. Do not drive or operate heavy machinery after taking. Do not take with alcohol or other sedating medications. Ensure you apply your CPAP within 5-10 minutes of taking to avoid falling asleep without it. May cause some morning grogginess or vivid dreams.   If this doesn't help you fall and stay asleep, we can try an alternative medication  We discussed how untreated sleep apnea puts an individual at risk for cardiac arrhthymias, pulm HTN, DM, stroke and increases their risk for daytime accidents. We also briefly reviewed treatment options including weight loss, side sleeping position, oral appliance, CPAP therapy or referral to ENT for possible surgical options  We could look at [facility] if you continue to have trouble; however, it sounds like it's more so related to you getting to sleep so let's see how the sleep aid goes first   Let us or you primary care doctor know if the muscle pain in your chest/side doesn't get better. If it gets significant worse, go to the emergency department.   Follow up in 6 weeks with Waynetta Sandy NP or Florentina Addison NP. If symptoms do not improve or worsen, please contact office for sooner follow up or seek emergency care.    Insomnia See above  Obesity BMI 37. Healthy weight loss encouraged.    Advised if symptoms do not improve or worsen, to please contact office for sooner follow up or seek emergency care.   I spent 35 minutes of dedicated  to the care of this patient on the date of this encounter to include pre-visit review of records, face-to-face time with the patient discussing conditions above, post visit ordering of testing, clinical documentation with the electronic health record, making appropriate referrals as documented, and communicating necessary findings to members of the patients care team.  Noemi Chapel, NP 09/01/2023  Pt aware and understands NP's role.

## 2023-09-01 NOTE — Patient Instructions (Addendum)
Increase use of CPAP every night, minimum of 4-6 hours a night.  Change equipment as directed. Wash your tubing with warm soap and water daily, hang to dry. Wash humidifier portion weekly. Use bottled, distilled water and change daily Be aware of reduced alertness and do not drive or operate heavy machinery if experiencing this or drowsiness.  Exercise encouraged, as tolerated. Healthy weight management discussed.  Avoid or decrease alcohol consumption and medications that make you more sleepy, if possible. Notify if persistent daytime sleepiness occurs even with consistent use of PAP therapy.  Start ramelteon 8 mg At bedtime as needed for sleep. Take immediately before bed. Ensure you have 7-8 hours in the bed after taking. Monitor for any mood changes or changes in sleep habits. Stop and notify immediately if these occur. Do not drive or operate heavy machinery after taking. Do not take with alcohol or other sedating medications. Ensure you apply your CPAP within 5-10 minutes of taking to avoid falling asleep without it. May cause some morning grogginess or vivid dreams.   If this doesn't help you fall and stay asleep, we can try an alternative medication  We discussed how untreated sleep apnea puts an individual at risk for cardiac arrhthymias, pulm HTN, DM, stroke and increases their risk for daytime accidents. We also briefly reviewed treatment options including weight loss, side sleeping position, oral appliance, CPAP therapy or referral to ENT for possible surgical options  We could look at [facility] if you continue to have trouble; however, it sounds like it's more so related to you getting to sleep so let's see how the sleep aid goes first   Let us or you primary care doctor know if the muscle pain in your chest/side doesn't get better. If it gets significant worse, go to the emergency department.   Follow up in 6 weeks with Waynetta Sandy NP or Florentina Addison NP. If symptoms do not improve or worsen, please  contact office for sooner follow up or seek emergency care.

## 2023-09-01 NOTE — Assessment & Plan Note (Signed)
 See above

## 2023-09-17 ENCOUNTER — Other Ambulatory Visit: Payer: Self-pay | Admitting: Cardiology

## 2023-09-17 DIAGNOSIS — I2510 Atherosclerotic heart disease of native coronary artery without angina pectoris: Secondary | ICD-10-CM

## 2023-09-17 DIAGNOSIS — I10 Essential (primary) hypertension: Secondary | ICD-10-CM

## 2023-10-04 ENCOUNTER — Ambulatory Visit (INDEPENDENT_AMBULATORY_CARE_PROVIDER_SITE_OTHER): Payer: Medicare Other

## 2023-10-04 DIAGNOSIS — I442 Atrioventricular block, complete: Secondary | ICD-10-CM | POA: Diagnosis not present

## 2023-10-05 LAB — CUP PACEART REMOTE DEVICE CHECK
Battery Remaining Longevity: 94 mo
Battery Remaining Percentage: 78 %
Battery Voltage: 3.02 V
Brady Statistic AP VP Percent: 1 %
Brady Statistic AP VS Percent: 5.6 %
Brady Statistic AS VP Percent: 1 %
Brady Statistic AS VS Percent: 94 %
Brady Statistic RA Percent Paced: 5.3 %
Brady Statistic RV Percent Paced: 1 %
Date Time Interrogation Session: 20250225020013
Implantable Lead Connection Status: 753985
Implantable Lead Connection Status: 753985
Implantable Lead Implant Date: 20220223
Implantable Lead Implant Date: 20220223
Implantable Lead Location: 753859
Implantable Lead Location: 753860
Implantable Pulse Generator Implant Date: 20220223
Lead Channel Impedance Value: 430 Ohm
Lead Channel Impedance Value: 450 Ohm
Lead Channel Pacing Threshold Amplitude: 0.75 V
Lead Channel Pacing Threshold Amplitude: 0.75 V
Lead Channel Pacing Threshold Pulse Width: 0.5 ms
Lead Channel Pacing Threshold Pulse Width: 0.5 ms
Lead Channel Sensing Intrinsic Amplitude: 12 mV
Lead Channel Sensing Intrinsic Amplitude: 3.3 mV
Lead Channel Setting Pacing Amplitude: 2 V
Lead Channel Setting Pacing Amplitude: 2.5 V
Lead Channel Setting Pacing Pulse Width: 0.5 ms
Lead Channel Setting Sensing Sensitivity: 2 mV
Pulse Gen Model: 2272
Pulse Gen Serial Number: 3900218

## 2023-10-13 ENCOUNTER — Ambulatory Visit: Payer: Medicare Other | Admitting: Nurse Practitioner

## 2023-10-19 ENCOUNTER — Other Ambulatory Visit: Payer: Self-pay | Admitting: Cardiology

## 2023-10-21 ENCOUNTER — Telehealth: Payer: Self-pay

## 2023-10-21 NOTE — Telephone Encounter (Signed)
 Copied from CRM [id number]. Topic: General - Other >> Oct 21, 2023 12:57 PM Rodman Pickle T wrote: Reason for CRM: patient is needing a call back he received a call from the office he would like a call back

## 2023-10-24 NOTE — Telephone Encounter (Signed)
 Upon looking in pts chart, I am unable to see where anyone has called. Attempted to reach pt. However, phone rang out to answer.

## 2023-11-03 ENCOUNTER — Encounter: Payer: Self-pay | Admitting: Gastroenterology

## 2023-11-03 ENCOUNTER — Encounter: Payer: Self-pay | Admitting: Internal Medicine

## 2023-11-03 ENCOUNTER — Ambulatory Visit (INDEPENDENT_AMBULATORY_CARE_PROVIDER_SITE_OTHER): Admitting: Internal Medicine

## 2023-11-03 VITALS — BP 136/82 | HR 65 | Temp 97.8°F | Ht 69.0 in | Wt 254.2 lb

## 2023-11-03 DIAGNOSIS — Z9861 Coronary angioplasty status: Secondary | ICD-10-CM

## 2023-11-03 DIAGNOSIS — K635 Polyp of colon: Secondary | ICD-10-CM | POA: Insufficient documentation

## 2023-11-03 DIAGNOSIS — Z0000 Encounter for general adult medical examination without abnormal findings: Secondary | ICD-10-CM | POA: Diagnosis not present

## 2023-11-03 DIAGNOSIS — I2510 Atherosclerotic heart disease of native coronary artery without angina pectoris: Secondary | ICD-10-CM

## 2023-11-03 DIAGNOSIS — R0789 Other chest pain: Secondary | ICD-10-CM | POA: Diagnosis not present

## 2023-11-03 DIAGNOSIS — I10 Essential (primary) hypertension: Secondary | ICD-10-CM | POA: Diagnosis not present

## 2023-11-03 LAB — COMPREHENSIVE METABOLIC PANEL WITH GFR
ALT: 9 U/L (ref 0–53)
AST: 13 U/L (ref 0–37)
Albumin: 3.7 g/dL (ref 3.5–5.2)
Alkaline Phosphatase: 90 U/L (ref 39–117)
BUN: 11 mg/dL (ref 6–23)
CO2: 29 meq/L (ref 19–32)
Calcium: 8.5 mg/dL (ref 8.4–10.5)
Chloride: 104 meq/L (ref 96–112)
Creatinine, Ser: 1.02 mg/dL (ref 0.40–1.50)
GFR: 73.62 mL/min (ref >60.00)
Glucose, Bld: 135 mg/dL — ABNORMAL HIGH (ref 70–99)
Potassium: 4 meq/L (ref 3.5–5.1)
Sodium: 137 meq/L (ref 135–145)
Total Bilirubin: 0.7 mg/dL (ref 0.2–1.2)
Total Protein: 7.2 g/dL (ref 6.0–8.3)

## 2023-11-03 LAB — LIPID PANEL
Cholesterol: 137 mg/dL (ref 0–200)
HDL: 31.8 mg/dL — ABNORMAL LOW (ref >39.00)
LDL Cholesterol: 86 mg/dL (ref 0–99)
NonHDL: 104.74
Total CHOL/HDL Ratio: 4
Triglycerides: 93 mg/dL (ref 0.0–149.0)
VLDL: 18.6 mg/dL (ref 0.0–40.0)

## 2023-11-03 LAB — CBC WITH DIFFERENTIAL/PLATELET
Basophils Absolute: 0 10*3/uL (ref 0.0–0.1)
Basophils Relative: 0.6 % (ref 0.0–3.0)
Eosinophils Absolute: 0.2 10*3/uL (ref 0.0–0.7)
Eosinophils Relative: 4.7 % (ref 0.0–5.0)
HCT: 40.8 % (ref 39.0–52.0)
Hemoglobin: 13 g/dL (ref 13.0–17.0)
Lymphocytes Relative: 26 % (ref 12.0–46.0)
Lymphs Abs: 1.2 10*3/uL (ref 0.7–4.0)
MCHC: 31.8 g/dL (ref 30.0–36.0)
MCV: 82.4 fl (ref 78.0–100.0)
Monocytes Absolute: 0.6 10*3/uL (ref 0.1–1.0)
Monocytes Relative: 13.6 % — ABNORMAL HIGH (ref 3.0–12.0)
Neutro Abs: 2.5 10*3/uL (ref 1.4–7.7)
Neutrophils Relative %: 55.1 % (ref 43.0–77.0)
Platelets: 137 10*3/uL — ABNORMAL LOW (ref 150.0–400.0)
RBC: 4.95 Mil/uL (ref 4.22–5.81)
RDW: 15.1 % (ref 11.5–15.5)
WBC: 4.6 10*3/uL (ref 4.0–10.5)

## 2023-11-03 LAB — URINALYSIS
Hgb urine dipstick: NEGATIVE
Ketones, ur: NEGATIVE
Leukocytes,Ua: NEGATIVE
Nitrite: NEGATIVE
Specific Gravity, Urine: 1.025 (ref 1.000–1.030)
Total Protein, Urine: NEGATIVE
Urine Glucose: NEGATIVE
Urobilinogen, UA: 0.2 (ref 0.0–1.0)
pH: 6 (ref 5.0–8.0)

## 2023-11-03 MED ORDER — NITROGLYCERIN 0.4 MG SL SUBL: SUBLINGUAL_TABLET | SUBLINGUAL | 1 refills | Status: DC

## 2023-11-03 MED ORDER — RANOLAZINE ER 500 MG PO TB12: 1000.0000 mg | ORAL_TABLET | Freq: Two times a day (BID) | ORAL | 0 refills | Status: DC

## 2023-11-03 MED ORDER — ROSUVASTATIN CALCIUM 40 MG PO TABS: 40.0000 mg | ORAL_TABLET | Freq: Every day | ORAL | 0 refills | Status: DC

## 2023-11-03 MED ORDER — ISOSORBIDE MONONITRATE ER 60 MG PO TB24: 60.0000 mg | ORAL_TABLET | Freq: Every day | ORAL | 0 refills | Status: DC

## 2023-11-03 MED ORDER — SPIRONOLACTONE 25 MG PO TABS
12.5000 mg | ORAL_TABLET | Freq: Every day | ORAL | 0 refills | Status: DC
Start: 2023-11-03 — End: 2024-05-21

## 2023-11-03 MED ORDER — EZETIMIBE 10 MG PO TABS
10.0000 mg | ORAL_TABLET | Freq: Every day | ORAL | 0 refills | Status: DC
Start: 2023-11-03 — End: 2024-01-11

## 2023-11-03 MED ORDER — CARVEDILOL 25 MG PO TABS: 25.0000 mg | ORAL_TABLET | Freq: Two times a day (BID) | ORAL | 0 refills | Status: DC

## 2023-11-03 NOTE — Assessment & Plan Note (Signed)
 Diet discussed Monitor blood pressure at home

## 2023-11-03 NOTE — Assessment & Plan Note (Signed)
 Chronic angina pectoris NTG prn Sch f/u w/Dr P [place]

## 2023-11-03 NOTE — Assessment & Plan Note (Signed)
  Colon - last in 2022 polyps (Dr Barron Alvine); due in 3 years - due in 2025

## 2023-11-03 NOTE — Progress Notes (Signed)
 Subjective:  Patient ID: Anthony Chapman, male    DOB: 14-Aug-1950  Age: 73 y.o. MRN: [id number]  CC: Annual Exam (Annual Exam)   HPI Anthony Chapman presents for a well exam C/o exertional CP - not new F/u HTN, CAD  Outpatient Medications Prior to Visit  Medication Sig Dispense Refill   amLODipine (NORVASC) 10 MG tablet TAKE 1 TABLET BY MOUTH DAILY 90 tablet 2   aspirin 81 MG EC tablet Take 1 tablet (81 mg total) by mouth daily. 30 tablet 0   cetirizine (ZYRTEC) 10 MG tablet Take 1 tablet (10 mg total) by mouth daily as needed for allergies. 90 tablet 3   ibuprofen (ADVIL) 600 MG tablet Take 1 twice a day with food as needed for pain 60 tablet 1   montelukast (SINGULAIR) 10 MG tablet Take 10 mg by mouth daily as needed (allergies).     VITAMIN D, CHOLECALCIFEROL, PO Take 1 tablet by mouth daily as needed (vitamin d supplement).     carvedilol (COREG) 25 MG tablet TAKE 1 TABLET BY MOUTH TWICE  DAILY WITH MEALS 30 tablet 0   ezetimibe (ZETIA) 10 MG tablet TAKE 1 TABLET BY MOUTH DAILY 15 tablet 0   HYDROcodone-acetaminophen (NORCO/VICODIN) 5-325 MG tablet Take 1-2 tablets by mouth every 6 (six) hours as needed. 20 tablet 0   isosorbide mononitrate (IMDUR) 60 MG 24 hr tablet TAKE 1 TABLET BY MOUTH DAILY 30 tablet 0   methylPREDNISolone (MEDROL DOSEPAK) 4 MG TBPK tablet Per dose pack instruction 21 tablet 0   nitroGLYCERIN (NITROSTAT) 0.4 MG SL tablet DISSOLVE 1 TABLET UNDER THE  TONGUE EVERY 5 MINUTES AS NEEDED FOR CHEST PAIN. MAX OF 3 TABLETS IN 15 MINUTES. CALL 911 IF PAIN  PERSISTS. 12 tablet 0   ramelteon (ROZEREM) 8 MG tablet Take 1 tablet (8 mg total) by mouth at bedtime. 30 tablet 1   ranolazine (RANEXA) 500 MG 12 hr tablet TAKE 2 TABLETS BY MOUTH TWICE  DAILY 60 tablet 0   rosuvastatin (CRESTOR) 40 MG tablet TAKE 1 TABLET BY MOUTH DAILY 15 tablet 0   spironolactone (ALDACTONE) 25 MG tablet TAKE ONE-HALF TABLET BY MOUTH  DAILY 7 tablet 0   No facility-administered medications prior  to visit.    ROS: Review of Systems  Constitutional:  Negative for appetite change, fatigue and unexpected weight change.  HENT:  Negative for congestion, nosebleeds, sneezing, sore throat and trouble swallowing.   Eyes:  Negative for itching and visual disturbance.  Respiratory:  Negative for cough.   Cardiovascular:  Negative for chest pain, palpitations and leg swelling.  Gastrointestinal:  Negative for abdominal distention, blood in stool, diarrhea and nausea.  Genitourinary:  Negative for frequency and hematuria.  Musculoskeletal:  Negative for back pain, gait problem, joint swelling and neck pain.  Skin:  Negative for rash.  Neurological:  Negative for dizziness, tremors, speech difficulty and weakness.  Psychiatric/Behavioral:  Negative for agitation, dysphoric mood, sleep disturbance and suicidal ideas. The patient is not nervous/anxious.     Objective:  BP 136/82   Pulse 65   Temp 97.8 F (36.6 C)   Ht 5\' 9"  (1.753 m)   Wt 254 lb 3.2 oz (115.3 kg)   SpO2 95%   BMI 37.54 kg/m   BP Readings from Last 3 Encounters:  11/03/23 136/82  09/01/23 130/72  05/25/23 129/69    Wt Readings from Last 3 Encounters:  11/03/23 254 lb 3.2 oz (115.3 kg)  09/01/23 255 lb (115.7 kg)  05/25/23 251 lb (113.9 kg)    Physical Exam Constitutional:      General: He is not in acute distress.    Appearance: He is well-developed. He is obese.     Comments: NAD  Eyes:     Conjunctiva/sclera: Conjunctivae normal.     Pupils: Pupils are equal, round, and reactive to light.  Neck:     Thyroid: No thyromegaly.     Vascular: No JVD.  Cardiovascular:     Rate and Rhythm: Normal rate and regular rhythm.     Heart sounds: Normal heart sounds. No murmur heard.    No friction rub. No gallop.  Pulmonary:     Effort: Pulmonary effort is normal. No respiratory distress.     Breath sounds: Normal breath sounds. No wheezing or rales.  Chest:     Chest wall: No tenderness.  Abdominal:      General: Bowel sounds are normal. There is no distension.     Palpations: Abdomen is soft. There is no mass.     Tenderness: There is no abdominal tenderness. There is no guarding or rebound.  Musculoskeletal:        General: No tenderness. Normal range of motion.     Cervical back: Normal range of motion.  Lymphadenopathy:     Cervical: No cervical adenopathy.  Skin:    General: Skin is warm and dry.     Findings: No rash.  Neurological:     Mental Status: He is alert and oriented to person, place, and time.     Cranial Nerves: No cranial nerve deficit.     Motor: No abnormal muscle tone.     Coordination: Coordination normal.     Gait: Gait normal.     Deep Tendon Reflexes: Reflexes are normal and symmetric.  Psychiatric:        Behavior: Behavior normal.        Thought Content: Thought content normal.        Judgment: Judgment normal.   Rectal - per GI  Lab Results  Component Value Date   WBC 4.6 01/28/2022   HGB 12.6 (L) 01/28/2022   HCT 38.7 (L) 01/28/2022   PLT 142.0 (L) 01/28/2022   GLUCOSE 121 (H) 01/28/2022   CHOL 184 04/09/2022   TRIG 105 04/09/2022   HDL 35 (L) 04/09/2022   LDLDIRECT 199.5 07/25/2012   LDLCALC 130 (H) 04/09/2022   ALT 10 04/09/2022   AST 15 04/09/2022   NA 137 01/28/2022   K 4.1 01/28/2022   CL 102 01/28/2022   CREATININE 1.04 01/28/2022   BUN 15 01/28/2022   CO2 28 01/28/2022   TSH 5.22 01/28/2022   PSA 4.76 (H) 01/28/2022   INR 1.1 08/16/2016   HGBA1C 6.6 (H) 01/28/2022    No results found.  Assessment & Plan:   Problem List Items Addressed This Visit     Essential hypertension (Chronic)   Diet discussed Monitor blood pressure at home      Relevant Medications   carvedilol (COREG) 25 MG tablet   ezetimibe (ZETIA) 10 MG tablet   isosorbide mononitrate (IMDUR) 60 MG 24 hr tablet   nitroGLYCERIN (NITROSTAT) 0.4 MG SL tablet   ranolazine (RANEXA) 500 MG 12 hr tablet   rosuvastatin (CRESTOR) 40 MG tablet   spironolactone  (ALDACTONE) 25 MG tablet   CAD S/Anthony percutaneous coronary angioplasty - 2015 PCI to RCA (2.5 x 14 Resolute DES) & Cx (3 x 12 Resolute DES) (Chronic)   Chronic angina  pectoris NTG prn Sch f/u w/Dr Anthony Chapman      Relevant Medications   carvedilol (COREG) 25 MG tablet   ezetimibe (ZETIA) 10 MG tablet   isosorbide mononitrate (IMDUR) 60 MG 24 hr tablet   nitroGLYCERIN (NITROSTAT) 0.4 MG SL tablet   ranolazine (RANEXA) 500 MG 12 hr tablet   rosuvastatin (CRESTOR) 40 MG tablet   spironolactone (ALDACTONE) 25 MG tablet   Other Relevant Orders   Ambulatory referral to Cardiology   Well adult exam - Primary    We discussed age appropriate health related issues, including available/recomended screening tests and vaccinations. Labs were ordered to be later reviewed . All questions were answered. We discussed one or more of the following - seat belt use, use of sunscreen/sun exposure exercise, safe sex, fall risk reduction, second hand smoke exposure, firearm use and storage, seat belt use, a need for adhering to healthy diet and exercise. Diet discussed Do 5000 steps a day Labs were ordered.  All questions were answered. Colon - last in 2022 polyps (Dr Barron Alvine); due in 3 years - due in 2025 Pt declined shots      Relevant Orders   TSH   Urinalysis   CBC with Differential/Platelet   Lipid panel   PSA   Comprehensive metabolic panel with GFR   Midsternal chest pain   Chronic angina pectoris NTG prn Sch f/u w/Dr Anthony Chapman      Relevant Orders   Ambulatory referral to Cardiology   Colon polyps    Colon - last in 2022 polyps (Dr Barron Alvine); due in 3 years - due in 2025      Relevant Orders   Ambulatory referral to Gastroenterology      Meds ordered this encounter  Medications   carvedilol (COREG) 25 MG tablet    Sig: Take 1 tablet (25 mg total) by mouth 2 (two) times daily with a meal.    Dispense:  180 tablet    Refill:  0   ezetimibe (ZETIA) 10 MG tablet    Sig: Take 1  tablet (10 mg total) by mouth daily.    Dispense:  90 tablet    Refill:  0   isosorbide mononitrate (IMDUR) 60 MG 24 hr tablet    Sig: Take 1 tablet (60 mg total) by mouth daily.    Dispense:  90 tablet    Refill:  0   nitroGLYCERIN (NITROSTAT) 0.4 MG SL tablet    Sig: DISSOLVE 1 TABLET UNDER THE  TONGUE EVERY 5 MINUTES AS NEEDED FOR CHEST PAIN. MAX OF 3 TABLETS IN 15 MINUTES. CALL 911 IF PAIN  PERSISTS.    Dispense:  20 tablet    Refill:  1   ranolazine (RANEXA) 500 MG 12 hr tablet    Sig: Take 2 tablets (1,000 mg total) by mouth 2 (two) times daily.    Dispense:  180 tablet    Refill:  0   rosuvastatin (CRESTOR) 40 MG tablet    Sig: Take 1 tablet (40 mg total) by mouth daily.    Dispense:  15 tablet    Refill:  0   spironolactone (ALDACTONE) 25 MG tablet    Sig: Take 0.5 tablets (12.5 mg total) by mouth daily.    Dispense:  90 tablet    Refill:  0      Follow-up: Return for a follow-up visit.  Sonda Primes, MD

## 2023-11-03 NOTE — Assessment & Plan Note (Signed)
  We discussed age appropriate health related issues, including available/recomended screening tests and vaccinations. Labs were ordered to be later reviewed . All questions were answered. We discussed one or more of the following - seat belt use, use of sunscreen/sun exposure exercise, safe sex, fall risk reduction, second hand smoke exposure, firearm use and storage, seat belt use, a need for adhering to healthy diet and exercise. Diet discussed Do 5000 steps a day Labs were ordered.  All questions were answered. Colon - last in 2022 polyps (Dr Barron Alvine); due in 3 years - due in 2025 Pt declined shots

## 2023-11-04 LAB — TSH: TSH: 4.39 u[IU]/mL (ref 0.35–5.50)

## 2023-11-04 LAB — PSA: PSA: 6.26 ng/mL — ABNORMAL HIGH (ref 0.10–4.00)

## 2023-11-08 ENCOUNTER — Encounter: Payer: Self-pay | Admitting: Internal Medicine

## 2023-11-09 NOTE — Addendum Note (Signed)
 Addended by: Elease Etienne A on: 11/09/2023 12:01 PM   Modules accepted: Orders

## 2023-11-09 NOTE — Progress Notes (Signed)
 Remote pacemaker transmission.

## 2023-12-01 ENCOUNTER — Ambulatory Visit (AMBULATORY_SURGERY_CENTER)

## 2023-12-01 VITALS — Ht 69.0 in | Wt 252.0 lb

## 2023-12-01 DIAGNOSIS — Z860100 Personal history of colon polyps, unspecified: Secondary | ICD-10-CM

## 2023-12-01 MED ORDER — NA SULFATE-K SULFATE-MG SULF 17.5-3.13-1.6 GM/177ML PO SOLN: 1.0000 | Freq: Once | ORAL | 0 refills | Status: AC

## 2023-12-01 NOTE — Progress Notes (Signed)
 No egg or soy allergy known to patient  No issues known to pt with past sedation with any surgeries or procedures Patient denies ever being told they had issues or difficulty with intubation  No FH of Malignant Hyperthermia Pt is not on diet pills Pt is not on  home 02  Pt is not on blood thinners  Pt denies issues with constipation  No A fib or A flutter, patient has pacemaker Have any cardiac testing pending--no Pt can ambulate iondependently Pt denies use of chewing tobacco Discussed diabetic I weight loss medication holds Discussed NSAID holds Checked BMI Pt instructed to use [employer] or GoodRx for a price reduction on prep  Patient's chart reviewed by Rogena Class CNRA prior to previsit and patient appropriate for the [facility].  Pre visit completed and red dot placed by patient's name on their procedure day (on provider's schedule).

## 2023-12-06 ENCOUNTER — Ambulatory Visit

## 2023-12-06 ENCOUNTER — Other Ambulatory Visit: Payer: Self-pay | Admitting: Internal Medicine

## 2023-12-13 ENCOUNTER — Encounter: Payer: Self-pay | Admitting: Gastroenterology

## 2023-12-15 ENCOUNTER — Encounter (HOSPITAL_COMMUNITY): Payer: Self-pay

## 2023-12-22 ENCOUNTER — Ambulatory Visit: Admitting: Gastroenterology

## 2023-12-22 ENCOUNTER — Encounter: Payer: Self-pay | Admitting: Gastroenterology

## 2023-12-22 VITALS — BP 141/95 | HR 84 | Temp 97.6°F | Ht 69.0 in | Wt 252.0 lb

## 2023-12-22 DIAGNOSIS — Z860100 Personal history of colon polyps, unspecified: Secondary | ICD-10-CM

## 2023-12-22 MED ORDER — SODIUM CHLORIDE 0.9 % IV SOLN: 500.0000 mL | Freq: Once | INTRAVENOUS | Status: AC

## 2023-12-22 NOTE — Progress Notes (Signed)
 Vitals-DT  Pt's states no medical or surgical changes since previsit or office visit.  Patient states chest pain of #4.  States that he feels like his heart is jumping.  Pain does not radiate.  Dr Karene Oto notified and 12 lead done at this time per order.  After 12 Lead states pain #2 No pressure at this time.  [facility] notified. Dr notified.  Report given to charge nurse Caroll Cipro, RN She is in contact with the doctor.  Dr told patient to see the cardiologist ASAP.  Patient states that the chest pain is not unusual.  Dr spoke with the patient and his wife.  He has an appointment with the cardiologist next week.  Dr wants cardiac clearance before proceeding with procedure. Patient has no pain at this time 1100.  Procedure canceled.

## 2023-12-22 NOTE — Progress Notes (Signed)
 Patient presents for colonoscopy today for ongoing colon polyp surveillance.  He reports he has been having exertional chest pain every 3 weeks or so.  Typically resolves with nitroglycerin  or rest.  Having chest pain today with exertion; walking from waiting room into preprocedure bay and changing into gown was enough exertion to initiate chest pain.  He is scheduled to see Dr. [place] in the [facility]iology [facility] next month.  EKG obtained in preop and unchanged from comparison EKG on file with Left bundle branch block but no acute ischemic changes.  I discussed with the patient and his spouse at bedside.  We discussed the risks of colonoscopy and sedation with regards to his cardiovascular status.  Given his episodic exertional chest pain along with chest pain today with pretty minimal exertion, I think the better part of valor would be to have an appointment with his Cardiologist for evaluation and preoperative clearance.  He understands and agrees that this would be the better option for him today.  Harry Lindau, DO, [facility]

## 2023-12-23 ENCOUNTER — Other Ambulatory Visit: Payer: Self-pay | Admitting: Internal Medicine

## 2024-01-03 ENCOUNTER — Ambulatory Visit (INDEPENDENT_AMBULATORY_CARE_PROVIDER_SITE_OTHER): Payer: Medicare Other

## 2024-01-03 DIAGNOSIS — I442 Atrioventricular block, complete: Secondary | ICD-10-CM | POA: Diagnosis not present

## 2024-01-04 LAB — CUP PACEART REMOTE DEVICE CHECK
Battery Remaining Longevity: 92 mo
Battery Remaining Percentage: 76 %
Battery Voltage: 3.02 V
Brady Statistic AP VP Percent: 1 %
Brady Statistic AP VS Percent: 5.2 %
Brady Statistic AS VP Percent: 1 %
Brady Statistic AS VS Percent: 95 %
Brady Statistic RA Percent Paced: 5 %
Brady Statistic RV Percent Paced: 1 %
Date Time Interrogation Session: 20250527031115
Implantable Lead Connection Status: 753985
Implantable Lead Connection Status: 753985
Implantable Lead Implant Date: 20220223
Implantable Lead Implant Date: 20220223
Implantable Lead Location: 753859
Implantable Lead Location: 753860
Implantable Pulse Generator Implant Date: 20220223
Lead Channel Impedance Value: 440 Ohm
Lead Channel Impedance Value: 450 Ohm
Lead Channel Pacing Threshold Amplitude: 0.75 V
Lead Channel Pacing Threshold Amplitude: 0.75 V
Lead Channel Pacing Threshold Pulse Width: 0.5 ms
Lead Channel Pacing Threshold Pulse Width: 0.5 ms
Lead Channel Sensing Intrinsic Amplitude: 12 mV
Lead Channel Sensing Intrinsic Amplitude: 2.1 mV
Lead Channel Setting Pacing Amplitude: 2 V
Lead Channel Setting Pacing Amplitude: 2.5 V
Lead Channel Setting Pacing Pulse Width: 0.5 ms
Lead Channel Setting Sensing Sensitivity: 2 mV
Pulse Gen Model: 2272
Pulse Gen Serial Number: 3900218

## 2024-01-08 ENCOUNTER — Ambulatory Visit: Payer: Self-pay | Admitting: Cardiology

## 2024-01-09 ENCOUNTER — Other Ambulatory Visit: Payer: Self-pay | Admitting: Internal Medicine

## 2024-01-17 ENCOUNTER — Other Ambulatory Visit: Payer: Self-pay | Admitting: Internal Medicine

## 2024-01-17 DIAGNOSIS — I2510 Atherosclerotic heart disease of native coronary artery without angina pectoris: Secondary | ICD-10-CM

## 2024-01-17 DIAGNOSIS — I10 Essential (primary) hypertension: Secondary | ICD-10-CM

## 2024-01-27 ENCOUNTER — Ambulatory Visit

## 2024-01-27 VITALS — Ht 69.0 in | Wt 252.0 lb

## 2024-01-27 DIAGNOSIS — Z0100 Encounter for examination of eyes and vision without abnormal findings: Secondary | ICD-10-CM

## 2024-01-27 DIAGNOSIS — R739 Hyperglycemia, unspecified: Secondary | ICD-10-CM | POA: Diagnosis not present

## 2024-01-27 DIAGNOSIS — Z0000 Encounter for general adult medical examination without abnormal findings: Secondary | ICD-10-CM

## 2024-01-27 DIAGNOSIS — Z1159 Encounter for screening for other viral diseases: Secondary | ICD-10-CM

## 2024-01-27 NOTE — Progress Notes (Signed)
 Subjective:  Please attest and cosign this visit due to patients primary care provider not being in the office at the time the visit was completed.  (Pt of Dr A. Plotnikov)   Anthony Chapman is a 73 y.o. who presents for a Medicare Wellness preventive visit.  As a reminder, Annual Wellness Visits don't include a physical exam, and some assessments may be limited, especially if this visit is performed virtually. We may recommend an in-person follow-up visit with your provider if needed.  Visit Complete: Virtual I connected with  Anthony Chapman on 01/27/24 by a audio enabled telemedicine application and verified that I am speaking with the correct person using two identifiers.  Patient Location: Home  Provider Location: Office/Clinic  I discussed the limitations of evaluation and management by telemedicine. The patient expressed understanding and agreed to proceed.  Vital Signs: Because this visit was a virtual/telehealth visit, some criteria may be missing or patient reported. Any vitals not documented were not able to be obtained and vitals that have been documented are patient reported.  VideoDeclined- This patient declined Librarian, academic. Therefore the visit was completed with audio only.  Persons Participating in Visit: Patient.  AWV Questionnaire: No: Patient Medicare AWV questionnaire was not completed prior to this visit.  Cardiac Risk Factors include: advanced age (>43men, >35 women);dyslipidemia;hypertension;male gender;obesity (BMI >30kg/m2)     Objective:    Today's Vitals   01/27/24 1544  Weight: 252 lb (114.3 kg)  Height: 5' 9 (1.753 m)   Body mass index is 37.21 kg/m.     01/27/2024    3:44 PM 05/25/2023    3:48 PM 08/12/2022    4:05 PM 09/29/2020   12:12 PM 05/20/2020    8:48 AM 09/23/2016    4:25 PM 08/27/2016    3:29 PM  Advanced Directives  Does Patient Have a Medical Advance Directive? Yes No No No No No  No   Type of  Estate agent of [employer];Living will        Copy of Healthcare Power of Attorney in Chart? No - copy requested        Would patient like information on creating a medical advance directive?   No - Patient declined No - Patient declined No - Patient declined  Yes (Inpatient - patient defers creating a medical advance directive at this time)      Data saved with a previous flowsheet row definition    Current Medications (verified) Outpatient Encounter Medications as of 01/27/2024  Medication Sig   aspirin  81 MG EC tablet Take 1 tablet (81 mg total) by mouth daily.   carvedilol  (COREG ) 25 MG tablet TAKE 1 TABLET BY MOUTH TWICE  DAILY WITH MEALS   cetirizine  (ZYRTEC ) 10 MG tablet Take 1 tablet (10 mg total) by mouth daily as needed for allergies.   ezetimibe  (ZETIA ) 10 MG tablet TAKE 1 TABLET BY MOUTH DAILY   ibuprofen  (ADVIL ) 600 MG tablet Take 1 twice a day with food as needed for pain   isosorbide  mononitrate (IMDUR ) 60 MG 24 hr tablet TAKE 1 TABLET BY MOUTH DAILY   montelukast  (SINGULAIR ) 10 MG tablet Take 10 mg by mouth daily as needed (allergies).   nitroGLYCERIN  (NITROSTAT ) 0.4 MG SL tablet DISSOLVE 1 TABLET UNDER THE  TONGUE EVERY 5 MINUTES AS NEEDED FOR CHEST PAIN. MAX OF 3 TABLETS IN 15 MINUTES. CALL 911 IF PAIN  PERSISTS.   ranolazine  (RANEXA ) 500 MG 12 hr tablet TAKE 2  TABLETS BY MOUTH TWICE  DAILY   rosuvastatin  (CRESTOR ) 40 MG tablet TAKE 1 TABLET BY MOUTH DAILY   spironolactone  (ALDACTONE ) 25 MG tablet Take 0.5 tablets (12.5 mg total) by mouth daily.   VITAMIN D , CHOLECALCIFEROL , PO Take 1 tablet by mouth daily as needed (vitamin d  supplement).   amLODipine  (NORVASC ) 10 MG tablet TAKE 1 TABLET BY MOUTH DAILY (Patient not taking: Reported on 01/27/2024)   Facility-Administered Encounter Medications as of 01/27/2024  Medication   0.9 %  sodium chloride  infusion    Allergies (verified) Losartan    History: Past Medical History:  Diagnosis Date   Allergy     Spring- mild    Anginal pain (HCC)    Arthritis    all over (08/27/2016)   CAD (coronary artery disease)    a.  stent placement to the RCA in 2010 b. cath in 2015 with PCI to the RCA (2.25mm x87mm Resolute DES) and LCx (3.72mm x28mm Resolute DES).  c. 08/2016: cath showing 100% ISR of d-RCA with collaterals, 10% Ost LAD ISR, 90% mid-Cx stenosis (3.0x60mm Resolute DES placed)   ED (erectile dysfunction)    GERD (gastroesophageal reflux disease)    HTN (hypertension)    Hyperlipemia    LBBB (left bundle branch block)    Myocardial infarction (HCC) 2010; 2015   Sleep apnea    wears cpap    Past Surgical History:  Procedure Laterality Date   CARDIAC CATHETERIZATION  09/2013   CARDIAC CATHETERIZATION N/A 08/27/2016   Procedure: Left Heart Cath and Coronary Angiography;  Surgeon: Arleen Lacer, MD;  Location: [facility] INVASIVE CV LAB;  Service: Cardiovascular;  Laterality: N/A;   CARDIAC CATHETERIZATION N/A 08/27/2016   Procedure: Coronary Stent Intervention;  Surgeon: Arleen Lacer, MD;  Location: [facility] INVASIVE CV LAB;  Service: Cardiovascular;  Laterality: N/A;   COLONOSCOPY  2004   CORONARY ANGIOPLASTY WITH STENT PLACEMENT  2010; 1/192018   HERNIA REPAIR     INGUINAL HERNIA REPAIR Left    LEFT HEART CATH AND CORONARY ANGIOGRAPHY N/A 05/20/2020   Procedure: LEFT HEART CATH AND CORONARY ANGIOGRAPHY;  Surgeon: Swaziland, Peter M, MD;  Location: [facility] INVASIVE CV LAB;  Service: Cardiovascular;  Laterality: N/A;   PACEMAKER IMPLANT N/A 10/01/2020   Procedure: PACEMAKER IMPLANT;  Surgeon: Jolly Needle, MD;  Location: [facility] INVASIVE CV LAB;  Service: Cardiovascular;  Laterality: N/A;   UMBILICAL HERNIA REPAIR     Family History  Problem Relation Age of Onset   Hypertension Mother    CAD Mother    Hypertension Father    Arthritis Father    Hypertension Other    CAD Maternal Aunt    Colon cancer Neg Hx    Colon polyps Neg Hx    Esophageal cancer Neg Hx    Rectal cancer Neg Hx    Stomach cancer Neg  Hx    Social History   Socioeconomic History   Marital status: Married    Spouse name: Not on file   Number of children: Not on file   Years of education: Not on file   Highest education level: Not on file  Occupational History   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         Social Drivers of Corporate investment banker Strain: Low Risk  (01/27/2024)   Overall Financial Resource Strain (CARDIA)    Difficulty of Paying Living Expenses: Not hard at all  Food Insecurity: No Food Insecurity (01/27/2024)   Hunger Vital Sign    Worried About Running Out of Food in the Last Year: Never true    Ran Out of Food in the Last Year: Never true  Transportation Needs: No Transportation Needs (01/27/2024)   PRAPARE - Administrator, Civil Service (Medical): No    Lack of Transportation (Non-Medical): No  Physical Activity: Insufficiently Active (01/27/2024)   Exercise Vital Sign    Days of Exercise per Week: 2 days    Minutes of Exercise per Session: 30 min  Stress: No Stress Concern Present (01/27/2024)   [employer] of Occupational Health - Occupational Stress Questionnaire    Feeling of Stress: Not at all  Social Connections: Socially Integrated (01/27/2024)   Social Connection and Isolation Panel    Frequency of Communication with Friends and Family: More than three times a week    Frequency of Social Gatherings with Friends and Family: More than three times a week    Attends Religious  Services: More than 4 times per year    Active Member of [employer] or Organizations: Yes    Attends Engineer, structural: More than 4 times per year    Marital Status: Married    Tobacco Counseling Counseling given: No    Clinical Intake:  Pre-visit preparation completed: Yes  Pain : No/denies pain     BMI - recorded: 37.21 Nutritional Status: BMI > 30  Obese Nutritional Risks: None Diabetes: No  Lab Results  Component Value Date   HGBA1C 6.6 (H) 01/28/2022   HGBA1C 6.2 (H) 09/04/2021   HGBA1C 6.6 (H) 09/30/2020     How often do you need to have someone help you when you read instructions, pamphlets, or other written materials from your doctor or pharmacy?: 1 - Never  Interpreter Needed?: No  Information entered by :: Kandy Orris, CMA   Activities of Daily Living     01/27/2024    3:48 PM  In your present state of health, do you have any difficulty performing the following activities:  Hearing? 0  Vision? 0  Difficulty concentrating or making decisions? 0  Walking or climbing stairs? 0  Dressing or bathing? 0  Doing errands, shopping? 0  Preparing Food and eating ? N  Using the Toilet? N  In the past six months, have you accidently leaked urine? N  Do you have problems with loss of bowel control? N  Managing your Medications? N  Managing your Finances? N  Housekeeping or managing your Housekeeping? N    Patient Care Team: Plotnikov, Oakley Bellman, MD as PCP - General [place], Peter M, MD as PCP - Cardiology (Cardiology) Jolly Needle, MD (Inactive) as PCP - Electrophysiology (Cardiology) Amey Bal Milon Aloe., MD (Cardiology) Ottelin, Mark, MD (Inactive) as Consulting Physician (Urology)  I have updated your Care Teams any recent Medical Services you may have received from other providers in the past year.  Assessment:   This is a routine wellness examination for Philander.  Hearing/Vision screen Hearing Screening - Comments:: Denies hearing  difficulties   Vision Screening - Comments:: Wears rx glasses - referral to Ophthalmologist    Goals Addressed               This Visit's Progress     Patient Stated (pt-stated)        Patient stated he plans to reduce sweets and continue to exercise       Depression Screen     01/27/2024    3:51 PM 06/08/2023    8:14 AM 08/12/2022    4:08 PM 01/28/2022   10:37 AM 09/27/2016    5:08 PM  PHQ 2/9 Scores  PHQ - 2 Score 0 2 0 0 0  PHQ- 9 Score 0 4       Fall Risk     01/27/2024    3:50 PM 11/03/2023    9:25 AM 09/01/2023   10:58 AM 06/08/2023    8:14 AM 08/12/2022    4:05 PM  Fall Risk   Falls in the past year? 0 0 0 1 0  Number falls in past yr: 0 0 0 1 0  Injury with Fall? 0 0 0 1 0  Risk for fall due to : No Fall Risks No Fall Risks  History of fall(s) No Fall Risks  Follow up Falls evaluation completed;Falls prevention discussed Falls evaluation completed  Falls evaluation completed Falls prevention discussed      Data saved with a previous flowsheet row definition    MEDICARE RISK AT HOME:  Medicare Risk at Home Any stairs in or around the home?: No If so, are there any without handrails?: No Home free of loose throw rugs in walkways, pet beds, electrical cords, etc?: Yes Adequate lighting in your home to reduce risk of falls?: Yes Life alert?: No Use of a cane, walker or w/c?: No Grab bars in the bathroom?: No Shower chair or bench in shower?: Yes Elevated toilet seat or a handicapped toilet?: Yes  TIMED UP AND GO:  Was the test performed?  No  Cognitive Function: 6CIT completed        01/27/2024    4:14 PM 08/12/2022    4:08 PM  6CIT Screen  What Year? 0 points 0 points  What month? 0 points 0 points  What time? 0 points 0 points  Count back from 20 0 points 0 points  Months in reverse 0 points 0 points  Repeat phrase 0 points 0 points  Total Score 0 points 0 points    Immunizations Immunization History  Administered Date(s) Administered    PFIZER Comirnaty(Gray Top)Covid-19 Tri-Sucrose Vaccine 02/01/2020, 03/02/2020, 08/06/2020   Tdap 07/25/2012    Screening Tests Health Maintenance  Topic Date Due   FOOT EXAM  Never done   OPHTHALMOLOGY EXAM  Never done   Diabetic kidney evaluation - Urine ACR  Never done   Hepatitis C Screening  Never done   DTaP/Tdap/Td (2 - Td or Tdap) 07/25/2022   HEMOGLOBIN A1C  07/30/2022   COVID-19 Vaccine (4 - 2024-25 season) 04/10/2023   Colonoscopy  09/04/2023   Pneumococcal Vaccine: 50+ Years (1 of 2 - PCV) 08/31/2024 (Originally 09/11/1969)   INFLUENZA VACCINE  03/09/2024   Diabetic kidney evaluation - eGFR measurement  11/02/2024   Medicare Annual Wellness (AWV)  01/26/2025   HPV VACCINES  Aged Out   Meningococcal B Vaccine  Aged Out   Zoster  Vaccines- Shingrix  Discontinued    Health Maintenance  Health Maintenance Due  Topic Date Due   FOOT EXAM  Never done   OPHTHALMOLOGY EXAM  Never done   Diabetic kidney evaluation - Urine ACR  Never done   Hepatitis C Screening  Never done   DTaP/Tdap/Td (2 - Td or Tdap) 07/25/2022   HEMOGLOBIN A1C  07/30/2022   COVID-19 Vaccine (4 - 2024-25 season) 04/10/2023   Colonoscopy  09/04/2023   Health Maintenance Items Addressed:  Labs Ordered: Kidney Urine and Hemoglogin A1C and Hepatitis C Screening  Referral to Dr Seward Dao (Ophthalmologist) - for an eye exam  Additional Screening:  Vision Screening: Recommended annual ophthalmology exams for early detection of glaucoma and other disorders of the eye. Would you like a referral to an eye doctor? Yes  Referral to Dr Seward Dao  Dental Screening: Recommended annual dental exams for proper oral hygiene  Community Resource Referral / Chronic Care Management: CRR required this visit?  No   CCM required this visit?  No   Plan:    I have personally reviewed and noted the following in the patient's chart:   Medical and social history Use of alcohol, tobacco or illicit drugs  Current  medications and supplements including opioid prescriptions. Patient is not currently taking opioid prescriptions. Functional ability and status Nutritional status Physical activity Advanced directives List of other physicians Hospitalizations, surgeries, and ER visits in previous 12 months Vitals Screenings to include cognitive, depression, and falls Referrals and appointments  In addition, I have reviewed and discussed with patient certain preventive protocols, quality metrics, and best practice recommendations. A written personalized care plan for preventive services as well as general preventive health recommendations were provided to patient.   Patria Bookbinder, CMA   01/27/2024   After Visit Summary: (MyChart) Due to this being a telephonic visit, the after visit summary with patients personalized plan was offered to patient via MyChart   Notes: Nothing significant to report at this time.

## 2024-01-27 NOTE — Progress Notes (Unsigned)
 Cardiology Office Note:    Date:  01/27/2024   ID:  Anthony Chapman, DOB 01-30-1951, MRN [id number]  PCP:  Genia Kettering, MD   [facility] Providers Cardiologist:  Angeliyah Kirkey [place], MD Electrophysiologist:  Jolly Needle, MD (Inactive)     Referring MD: Genia Kettering, MD   No chief complaint on file.   History of Present Illness:    Anthony Chapman is a 73 y.o. male is seen for follow up CAD. Last seen by me 2 years ago. He has a hx of CAD (s/p DES to RCA in 2010, DES to RCA and LCx in 2015, DES to LCx 08/2016), HTN, HLD, prediabetes and chronic LBBB. Cardiac catheterization performed in January 2018 showed 100% ISR of distal RCA with left-to-right collaterals, 10% ostial LAD in-stent restenosis, 90% mid left circumflex stenosis treated with Resolute DES.  He developed recurrent angina in October 2021 and underwent a repeat cath on 05/20/2020 that revealed 100% RPLA occlusion with distal collateralization, 50% ramus lesion, 10% ostial to distal left circumflex lesion, 30% proximal RCA, 40% distal RCA lesion, EF 55 to 65%. Medical therapy was recommended.    He developed symptomatic bradycardia in February 2022 and required implantation of St Jude Medical Assurity MRI dual-chamber pacemaker. Echocardiogram obtained on 09/30/2020 showed EF 60 to 65%, trivial MR.   He is not device dependent.  Blood pressure was elevated, spironolactone  12.5 mg daily was added to his medical regimen. Last pacer check in November was normal. On his last visit he was started on Ranexa . No chest pain since.    On follow up today he reports he is doing well. He still has angina if he rushes too much but reports this is infrequent. Improved significantly on Ranexa . Uses sl Ntg prn. Recently has experienced right buttocks and leg pain typically at rest.  He stays active.    Past Medical History:  Diagnosis Date   Allergy    Spring- mild    Anginal pain (HCC)    Arthritis    all over (08/27/2016)    CAD (coronary artery disease)    a.  stent placement to the RCA in 2010 b. cath in 2015 with PCI to the RCA (2.19mm x44mm Resolute DES) and LCx (3.28mm x8mm Resolute DES).  c. 08/2016: cath showing 100% ISR of d-RCA with collaterals, 10% Ost LAD ISR, 90% mid-Cx stenosis (3.0x85mm Resolute DES placed)   ED (erectile dysfunction)    GERD (gastroesophageal reflux disease)    HTN (hypertension)    Hyperlipemia    LBBB (left bundle branch block)    Myocardial infarction (HCC) 2010; 2015   Sleep apnea    wears cpap     Past Surgical History:  Procedure Laterality Date   CARDIAC CATHETERIZATION  09/2013   CARDIAC CATHETERIZATION N/A 08/27/2016   Procedure: Left Heart Cath and Coronary Angiography;  Surgeon: Arleen Lacer, MD;  Location[facility] INVASIVE CV LAB;  Service: Cardiovascular;  Laterality: N/A;   CARDIAC CATHETERIZATION N/A 08/27/2016   Procedure: Coronary Stent Intervention;  Surgeon: Arleen Lacer, MD;  Location: [facility] INVASIVE CV LAB;  Service: Cardiovascular;  Laterality: N/A;   COLONOSCOPY  2004   CORONARY ANGIOPLASTY WITH STENT PLACEMENT  2010; 1/192018   HERNIA REPAIR     INGUINAL HERNIA REPAIR Left    LEFT HEART CATH AND CORONARY ANGIOGRAPHY N/A 05/20/2020   Procedure: LEFT HEART CATH AND CORONARY ANGIOGRAPHY;  Surgeon: [place], Kirubel Aja M, MD;  Location: [facility] INVASIVE CV LAB;  Service:  Cardiovascular;  Laterality: N/A;   PACEMAKER IMPLANT N/A 10/01/2020   Procedure: PACEMAKER IMPLANT;  Surgeon: Jolly Needle, MD;  Location: [facility] INVASIVE CV LAB;  Service: Cardiovascular;  Laterality: N/A;   UMBILICAL HERNIA REPAIR      Current Medications: No outpatient medications have been marked as taking for the 01/30/24 encounter (Appointment) with [place], Raymondo Garcialopez M, MD.   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         Social Drivers of Corporate investment banker Strain: Low Risk  (08/12/2022)   Overall Financial Resource Strain (CARDIA)    Difficulty of Paying Living Expenses: Not hard at all  Food Insecurity: No Food Insecurity (08/12/2022)   Hunger Vital Sign    Worried About Running Out of Food in the Last Year: Never true    Ran Out of Food in the Last Year: Never true  Transportation Needs: No Transportation Needs (08/12/2022)   PRAPARE - Administrator, Civil Service (Medical): No    Lack of Transportation (Non-Medical): No  Physical Activity: Inactive (08/12/2022)   Exercise Vital Sign    Days of Exercise per Week: 0 days    Minutes of Exercise per Session: 0 min  Stress: Stress Concern Present (08/12/2022)   [employer] of Occupational Health - Occupational Stress Questionnaire    Feeling of Stress : Rather much  Social Connections: Socially Integrated (08/12/2022)   Social Connection and Isolation Panel    Frequency of Communication with Friends and Family: More than three times a week    Frequency of Social Gatherings with Friends and Family: More than three times a week    Attends Religious Services: More than 4 times per year    Active Member of [employer] or  Organizations: Yes    Attends Engineer, structural: More than 4 times per year    Marital Status: Married     Family History: The patient's family history includes Arthritis in his father; CAD in his maternal aunt and mother; Hypertension in his father, mother, and another family member. There is no history of Colon cancer, Colon polyps, Esophageal cancer, Rectal cancer, or Stomach cancer.  ROS:   Please see the history of present illness.     All other systems reviewed and are negative.  EKGs/Labs/Other Studies Reviewed:    The following studies were reviewed today:  Cath 05/20/2020 Prox RCA lesion  is 30% stenosed. Dist RCA lesion is 40% stenosed. RPAV lesion is 100% stenosed. Prox LAD to Mid LAD lesion is 25% stenosed. Ramus lesion is 50% stenosed. Ost Cx to Dist Cx lesion is 10% stenosed. The left ventricular systolic function is normal. LV end diastolic pressure is normal. The left ventricular ejection fraction is 55-65% by visual estimate.   1. Single vessel occlusive CAD involving the PL branch of the RCA. This is well collateralized. 2. Patent stents in the LCx 3. Good LV function 4. Normal LVEDP   Plan: continue medical therapy   Echo 09/30/2020 1. Left ventricular ejection fraction, by estimation, is 60 to 65%. The  left ventricle has normal function. The left ventricle has no regional  wall motion abnormalities. There is mild concentric left ventricular  hypertrophy. Left ventricular diastolic  parameters were normal.   2. Right ventricular systolic function is mildly reduced. The right  ventricular size is mildly enlarged.   3. The mitral valve is normal in structure. Trivial mitral valve  regurgitation. No evidence of mitral stenosis.   4. The aortic valve is tricuspid. Aortic valve regurgitation is not  visualized. No aortic stenosis is present.   5. The inferior vena cava is normal in size with <50% respiratory  variability, suggesting right  atrial pressure of 8 mmHg.   Comparison(s): No prior Echocardiogram.   EKG:  EKG is not ordered today.    Recent Labs: 11/03/2023: ALT 9; BUN 11; Creatinine, Ser 1.02; Hemoglobin 13.0; Platelets 137.0; Potassium 4.0; Sodium 137; TSH 4.39  Recent Lipid Panel    Component Value Date/Time   CHOL 137 11/03/2023 1014   CHOL 184 04/09/2022 1147   TRIG 93.0 11/03/2023 1014   HDL 31.80 (L) 11/03/2023 1014   HDL 35 (L) 04/09/2022 1147   CHOLHDL 4 11/03/2023 1014   VLDL 18.6 11/03/2023 1014   LDLCALC 86 11/03/2023 1014   LDLCALC 130 (H) 04/09/2022 1147   LDLDIRECT 199.5 07/25/2012 0935      Physical Exam:    VS:  There were no vitals taken for this visit.    Wt Readings from Last 3 Encounters:  12/22/23 252 lb (114.3 kg)  12/01/23 252 lb (114.3 kg)  11/03/23 254 lb 3.2 oz (115.3 kg)     GEN:  Well nourished, obese, in no acute distress HEENT: Normal NECK: No JVD; No carotid bruits LYMPHATICS: No lymphadenopathy CARDIAC: RRR, no murmurs, rubs, gallops RESPIRATORY:  Clear to auscultation without rales, wheezing or rhonchi  ABDOMEN: Soft, non-tender, non-distended MUSCULOSKELETAL:  No edema; No deformity  SKIN: Warm and dry. Pedal pulses 1+ NEUROLOGIC:  Alert and oriented x 3 PSYCHIATRIC:  Normal affect   ASSESSMENT:    No diagnosis found.    PLAN:    In order of problems listed above:  CAD: Last cardiac catheterization in October 2021.  Occluded PL branch of the RCA. Well collateralized. Other stents patent. Angina improved on Ranexa , Imdur , amlodipine . Continue current therapy  Hypertension: Blood pressure is well controlled.   Hyperlipidemia: goal LDL < 70. Last LDL 109 on maximal Crestor  dose. Will add Zetia  10 mg daily. Repeat lab in 3 months.   Prediabetes: A1c 6.6% - per PCP  Bradycardia s/p pacemaker: Followed by EP service. Last seen in June and pacemaker function OK    Follow up in  6 months        Medication Adjustments/Labs and Tests  Ordered: Current medicines are reviewed at length with the patient today.  Concerns regarding medicines are  outlined above.  No orders of the defined types were placed in this encounter.  No orders of the defined types were placed in this encounter.   There are no Patient Instructions on file for this visit.   Signed, Zariyah Stephens [place], MD  01/27/2024 7:35 AM    [facility]

## 2024-01-27 NOTE — Patient Instructions (Addendum)
 Mr. Anthony Chapman , Thank you for taking time out of your busy schedule to complete your Annual Wellness Visit with me. I enjoyed our conversation and look forward to speaking with you again next year. I, as well as your care team,  appreciate your ongoing commitment to your health goals. Please review the following plan we discussed and let me know if I can assist you in the future. Your Game plan/ To Do List    Referrals: If you haven't heard from the office you've been referred to, please reach out to them at the phone provided.  Referral to Dr Seward Dao (Ophthalmologist) and ordered a Diabetic Kidney Urine and Hemoglobin A1C level and Hepatitis C Screening Follow up Visits: Next Medicare AWV with our clinical staff: 01/29/2025   Have you seen your provider in the last 6 months (3 months if uncontrolled diabetes)? Yes Next Office Visit with your provider: 02/06/2024  Clinician Recommendations:  Aim for 30 minutes of exercise or brisk walking, 6-8 glasses of water, and 5 servings of fruits and vegetables each day. Educated and advised on getting a Tdap (Tetenus) vaccine       This is a list of the screening recommended for you and due dates:  Health Maintenance  Topic Date Due   Complete foot exam   Never done   Eye exam for diabetics  Never done   Yearly kidney health urinalysis for diabetes  Never done   Hepatitis C Screening  Never done   DTaP/Tdap/Td vaccine (2 - Td or Tdap) 07/25/2022   Hemoglobin A1C  07/30/2022   COVID-19 Vaccine (4 - 2024-25 season) 04/10/2023   Colon Cancer Screening  09/04/2023   Pneumococcal Vaccine for age over 6 (1 of 2 - PCV) 08/31/2024*   Flu Shot  03/09/2024   Yearly kidney function blood test for diabetes  11/02/2024   Medicare Annual Wellness Visit  01/26/2025   HPV Vaccine  Aged Out   Meningitis B Vaccine  Aged Out   Zoster (Shingles) Vaccine  Discontinued  *Topic was postponed. The date shown is not the original due date.    Advanced directives: (Copy  Requested) Please bring a copy of your health care power of attorney and living will to the office to be added to your chart at your convenience. You can mail to [facility] [address] [place], [place] [address] or email to [email or web address] Advance Care Planning is important because it:  [x]  Makes sure you receive the medical care that is consistent with your values, goals, and preferences  [x]  It provides guidance to your family and loved ones and reduces their decisional burden about whether or not they are making the right decisions based on your wishes.  Follow the link provided in your after visit summary or read over the paperwork we have mailed to you to help you started getting your Advance Directives in place. If you need assistance in completing these, please reach out to us  so that we can help you!

## 2024-01-30 ENCOUNTER — Ambulatory Visit: Attending: Cardiology | Admitting: Cardiology

## 2024-01-30 ENCOUNTER — Encounter: Payer: Self-pay | Admitting: Cardiology

## 2024-01-30 VITALS — BP 122/76 | HR 73 | Ht 69.0 in | Wt 255.0 lb

## 2024-01-30 DIAGNOSIS — R0789 Other chest pain: Secondary | ICD-10-CM | POA: Diagnosis not present

## 2024-01-30 DIAGNOSIS — I10 Essential (primary) hypertension: Secondary | ICD-10-CM | POA: Diagnosis not present

## 2024-01-30 DIAGNOSIS — E785 Hyperlipidemia, unspecified: Secondary | ICD-10-CM

## 2024-01-30 DIAGNOSIS — I25119 Atherosclerotic heart disease of native coronary artery with unspecified angina pectoris: Secondary | ICD-10-CM

## 2024-01-30 DIAGNOSIS — I442 Atrioventricular block, complete: Secondary | ICD-10-CM

## 2024-01-30 MED ORDER — EZETIMIBE 10 MG PO TABS: 10.0000 mg | ORAL_TABLET | Freq: Every day | ORAL | 3 refills | Status: AC

## 2024-01-30 NOTE — Patient Instructions (Signed)
 Medication Instructions:  Continue same medications *If you need a refill on your cardiac medications before your next appointment, please call your pharmacy*  Lab Work: None ordered  Testing/Procedures: None ordered  Follow-Up: At [facility], you and your health needs are our priority.  As part of our continuing mission to provide you with exceptional heart care, our providers are all part of one team.  This team includes your primary Cardiologist (physician) and Advanced Practice Providers or APPs (Physician Assistants and Nurse Practitioners) who all work together to provide you with the care you need, when you need it.  Your next appointment:  1 year   Call in March to schedule June appointment     Provider:  Dr.Jordan        Schedule appointment with Pharm D [facility]    We recommend signing up for the patient portal called MyChart.  Sign up information is provided on this After Visit Summary.  MyChart is used to connect with patients for Virtual Visits (Telemedicine).  Patients are able to view lab/test results, encounter notes, upcoming appointments, etc.  Non-urgent messages can be sent to your provider as well.   To learn more about what you can do with MyChart, go to [email or web address].

## 2024-02-02 ENCOUNTER — Ambulatory Visit: Attending: Cardiovascular Disease | Admitting: Pharmacist

## 2024-02-02 ENCOUNTER — Other Ambulatory Visit (INDEPENDENT_AMBULATORY_CARE_PROVIDER_SITE_OTHER)

## 2024-02-02 DIAGNOSIS — Z1159 Encounter for screening for other viral diseases: Secondary | ICD-10-CM

## 2024-02-02 DIAGNOSIS — R739 Hyperglycemia, unspecified: Secondary | ICD-10-CM

## 2024-02-02 LAB — MICROALBUMIN / CREATININE URINE RATIO
Creatinine,U: 204.3 mg/dL
Microalb Creat Ratio: 6 mg/g (ref 0.0–30.0)
Microalb, Ur: 1.2 mg/dL (ref 0.0–1.9)

## 2024-02-02 LAB — HEMOGLOBIN A1C: Hgb A1c MFr Bld: 6.6 % — ABNORMAL HIGH (ref 4.6–6.5)

## 2024-02-02 NOTE — Progress Notes (Deleted)
 Patient ID: Anthony Chapman                 DOB: 09/27/1950                    MRN: [id number]     HPI: Anthony Chapman is a 73 y.o. male patient referred to lipid clinic by ***. PMH is significant for   Current Medications:  Intolerances:  Risk Factors:  LDL goal:   Diet:   Exercise:   Family History:   Social History:   Labs:  Past Medical History:  Diagnosis Date   Allergy    Spring- mild    Anginal pain (HCC)    Arthritis    all over (08/27/2016)   CAD (coronary artery disease)    a.  stent placement to the RCA in 2010 b. cath in 2015 with PCI to the RCA (2.78mm x59mm Resolute DES) and LCx (3.38mm x27mm Resolute DES).  c. 08/2016: cath showing 100% ISR of d-RCA with collaterals, 10% Ost LAD ISR, 90% mid-Cx stenosis (3.0x43mm Resolute DES placed)   ED (erectile dysfunction)    GERD (gastroesophageal reflux disease)    HTN (hypertension)    Hyperlipemia    LBBB (left bundle branch block)    Myocardial infarction (HCC) 2010; 2015   Sleep apnea    wears cpap     Current Outpatient Medications on File Prior to Visit  Medication Sig Dispense Refill   amLODipine  (NORVASC ) 10 MG tablet TAKE 1 TABLET BY MOUTH DAILY 90 tablet 2   aspirin  81 MG EC tablet Take 1 tablet (81 mg total) by mouth daily. 30 tablet 0   carvedilol  (COREG ) 25 MG tablet TAKE 1 TABLET BY MOUTH TWICE  DAILY WITH MEALS 180 tablet 3   diphenhydrAMINE  HCl (BENADRYL  ALLERGY PO) Take 1 tablet by mouth as needed.     ezetimibe  (ZETIA ) 10 MG tablet Take 1 tablet (10 mg total) by mouth daily. 90 tablet 3   ibuprofen  (ADVIL ) 600 MG tablet Take 1 twice a day with food as needed for pain (Patient not taking: Reported on 01/30/2024) 60 tablet 1   isosorbide  mononitrate (IMDUR ) 60 MG 24 hr tablet TAKE 1 TABLET BY MOUTH DAILY 90 tablet 3   montelukast  (SINGULAIR ) 10 MG tablet Take 10 mg by mouth daily as needed (allergies).     nitroGLYCERIN  (NITROSTAT ) 0.4 MG SL tablet DISSOLVE 1 TABLET UNDER THE  TONGUE EVERY 5  MINUTES AS NEEDED FOR CHEST PAIN. MAX OF 3 TABLETS IN 15 MINUTES. CALL 911 IF PAIN  PERSISTS. 50 tablet 7   ranolazine  (RANEXA ) 500 MG 12 hr tablet TAKE 2 TABLETS BY MOUTH TWICE  DAILY 360 tablet 2   rosuvastatin  (CRESTOR ) 40 MG tablet TAKE 1 TABLET BY MOUTH DAILY 15 tablet 0   spironolactone  (ALDACTONE ) 25 MG tablet Take 0.5 tablets (12.5 mg total) by mouth daily. 90 tablet 0   VITAMIN D , CHOLECALCIFEROL , PO Take 1 tablet by mouth daily as needed (vitamin d  supplement).     Current Facility-Administered Medications on File Prior to Visit  Medication Dose Route Frequency Provider Last Rate Last Admin   0.9 %  sodium chloride  infusion  500 mL Intravenous Once Cirigliano, Vito V, DO        Allergies  Allergen Reactions   Losartan  Other (See Comments)    angioedema    Assessment/Plan:  1. Hyperlipidemia -  Reviewed options for lowering LDL cholesterol, including statins, ezetimibe , PCSK9 inhibitors, Nexletol/Nexlizet, and Leqvio. Discussed  efficacy, dosing, side effects, and copay information.

## 2024-02-03 LAB — HEPATITIS C ANTIBODY: Hepatitis C Ab: NONREACTIVE

## 2024-02-06 ENCOUNTER — Ambulatory Visit (INDEPENDENT_AMBULATORY_CARE_PROVIDER_SITE_OTHER): Admitting: Internal Medicine

## 2024-02-06 ENCOUNTER — Ambulatory Visit: Payer: Self-pay | Admitting: Internal Medicine

## 2024-02-06 ENCOUNTER — Encounter: Payer: Self-pay | Admitting: Internal Medicine

## 2024-02-06 VITALS — BP 152/92 | HR 71 | Temp 97.8°F | Ht 69.0 in | Wt 255.4 lb

## 2024-02-06 DIAGNOSIS — H9313 Tinnitus, bilateral: Secondary | ICD-10-CM | POA: Diagnosis not present

## 2024-02-06 DIAGNOSIS — G4701 Insomnia due to medical condition: Secondary | ICD-10-CM | POA: Diagnosis not present

## 2024-02-06 DIAGNOSIS — I10 Essential (primary) hypertension: Secondary | ICD-10-CM

## 2024-02-06 DIAGNOSIS — E118 Type 2 diabetes mellitus with unspecified complications: Secondary | ICD-10-CM

## 2024-02-06 DIAGNOSIS — H9319 Tinnitus, unspecified ear: Secondary | ICD-10-CM | POA: Insufficient documentation

## 2024-02-06 DIAGNOSIS — Z7985 Long-term (current) use of injectable non-insulin antidiabetic drugs: Secondary | ICD-10-CM

## 2024-02-06 MED ORDER — TIRZEPATIDE 2.5 MG/0.5ML ~~LOC~~ SOAJ: 2.5000 mg | SUBCUTANEOUS | 2 refills | Status: DC

## 2024-02-06 MED ORDER — CLONAZEPAM 0.5 MG PO TABS: 0.5000 mg | ORAL_TABLET | Freq: Every day | ORAL | 5 refills | Status: AC

## 2024-02-06 NOTE — Assessment & Plan Note (Signed)
Start Mounjaro

## 2024-02-06 NOTE — Assessment & Plan Note (Signed)
 Ringing in the ears (10% disability from [place])

## 2024-02-06 NOTE — Assessment & Plan Note (Signed)
 Due to tinnitus Clonazepam prn  Potential benefits of a long term opioids use as well as potential risks (i.e. addiction risk, apnea etc) and complications (i.e. Somnolence, constipation and others) were explained to the patient and were aknowledged.

## 2024-02-06 NOTE — Progress Notes (Signed)
 Subjective:  Patient ID: Anthony Chapman, male    DOB: 1951-01-16  Age: 73 y.o. MRN: [id number]  CC: Medical Management of Chronic Issues (3 Month follow up. Review most recent labs)   HPI Anthony Chapman presents for DM, HTN, insomnia and ringing in the ears (10% disability from [place]) F/ DM, HTN  Outpatient Medications Prior to Visit  Medication Sig Dispense Refill   amLODipine  (NORVASC ) 10 MG tablet TAKE 1 TABLET BY MOUTH DAILY 90 tablet 2   aspirin  81 MG EC tablet Take 1 tablet (81 mg total) by mouth daily. 30 tablet 0   carvedilol  (COREG ) 25 MG tablet TAKE 1 TABLET BY MOUTH TWICE  DAILY WITH MEALS 180 tablet 3   diphenhydrAMINE  HCl (BENADRYL  ALLERGY PO) Take 1 tablet by mouth as needed.     ezetimibe  (ZETIA ) 10 MG tablet Take 1 tablet (10 mg total) by mouth daily. 90 tablet 3   ibuprofen  (ADVIL ) 600 MG tablet Take 1 twice a day with food as needed for pain 60 tablet 1   isosorbide  mononitrate (IMDUR ) 60 MG 24 hr tablet TAKE 1 TABLET BY MOUTH DAILY 90 tablet 3   montelukast  (SINGULAIR ) 10 MG tablet Take 10 mg by mouth daily as needed (allergies).     nitroGLYCERIN  (NITROSTAT ) 0.4 MG SL tablet DISSOLVE 1 TABLET UNDER THE  TONGUE EVERY 5 MINUTES AS NEEDED FOR CHEST PAIN. MAX OF 3 TABLETS IN 15 MINUTES. CALL 911 IF PAIN  PERSISTS. 50 tablet 7   ranolazine  (RANEXA ) 500 MG 12 hr tablet TAKE 2 TABLETS BY MOUTH TWICE  DAILY 360 tablet 2   rosuvastatin  (CRESTOR ) 40 MG tablet TAKE 1 TABLET BY MOUTH DAILY 15 tablet 0   spironolactone  (ALDACTONE ) 25 MG tablet Take 0.5 tablets (12.5 mg total) by mouth daily. 90 tablet 0   VITAMIN D , CHOLECALCIFEROL , PO Take 1 tablet by mouth daily as needed (vitamin d  supplement).     Facility-Administered Medications Prior to Visit  Medication Dose Route Frequency Provider Last Rate Last Admin   0.9 %  sodium chloride  infusion  500 mL Intravenous Once Cirigliano, Vito V, DO        ROS: Review of Systems  Constitutional:  Negative for appetite change, fatigue  and unexpected weight change.  HENT:  Positive for hearing loss and tinnitus. Negative for congestion, nosebleeds, sneezing, sore throat and trouble swallowing.   Eyes:  Negative for itching and visual disturbance.  Respiratory:  Negative for cough.   Cardiovascular:  Negative for chest pain, palpitations and leg swelling.  Gastrointestinal:  Negative for abdominal distention, blood in stool, diarrhea, nausea and vomiting.  Genitourinary:  Negative for frequency and hematuria.  Musculoskeletal:  Positive for gait problem. Negative for back pain, joint swelling and neck pain.  Skin:  Negative for rash.  Neurological:  Negative for dizziness, tremors, speech difficulty and weakness.  Psychiatric/Behavioral:  Positive for sleep disturbance. Negative for agitation, dysphoric mood and suicidal ideas. The patient is not nervous/anxious.     Objective:  BP (!) 152/92   Pulse 71   Temp 97.8 F (36.6 C)   Ht 5' 9 (1.753 m)   Wt 255 lb 6.4 oz (115.8 kg)   SpO2 99%   BMI 37.72 kg/m   BP Readings from Last 3 Encounters:  02/06/24 (!) 152/92  01/30/24 122/76  12/22/23 (!) 141/95    Wt Readings from Last 3 Encounters:  02/06/24 255 lb 6.4 oz (115.8 kg)  01/30/24 255 lb (115.7 kg)  01/27/24 252  lb (114.3 kg)    Physical Exam Constitutional:      General: He is not in acute distress.    Appearance: He is well-developed. He is obese.     Comments: NAD   Eyes:     Conjunctiva/sclera: Conjunctivae normal.     Pupils: Pupils are equal, round, and reactive to light.   Neck:     Thyroid : No thyromegaly.     Vascular: No JVD.   Cardiovascular:     Rate and Rhythm: Normal rate and regular rhythm.     Heart sounds: Normal heart sounds. No murmur heard.    No friction rub. No gallop.  Pulmonary:     Effort: Pulmonary effort is normal. No respiratory distress.     Breath sounds: Normal breath sounds. No wheezing or rales.  Chest:     Chest wall: No tenderness.  Abdominal:      General: Bowel sounds are normal. There is no distension.     Palpations: Abdomen is soft. There is no mass.     Tenderness: There is no abdominal tenderness. There is no guarding or rebound.   Musculoskeletal:        General: No tenderness. Normal range of motion.     Cervical back: Normal range of motion.  Lymphadenopathy:     Cervical: No cervical adenopathy.   Skin:    General: Skin is warm and dry.     Findings: No rash.   Neurological:     Mental Status: He is alert and oriented to person, place, and time.     Cranial Nerves: No cranial nerve deficit.     Motor: No abnormal muscle tone.     Coordination: Coordination normal.     Gait: Gait normal.     Deep Tendon Reflexes: Reflexes are normal and symmetric.   Psychiatric:        Behavior: Behavior normal.        Thought Content: Thought content normal.        Judgment: Judgment normal.   Obese  Lab Results  Component Value Date   WBC 4.6 11/03/2023   HGB 13.0 11/03/2023   HCT 40.8 11/03/2023   PLT 137.0 (L) 11/03/2023   GLUCOSE 135 (H) 11/03/2023   CHOL 137 11/03/2023   TRIG 93.0 11/03/2023   HDL 31.80 (L) 11/03/2023   LDLDIRECT 199.5 07/25/2012   LDLCALC 86 11/03/2023   ALT 9 11/03/2023   AST 13 11/03/2023   NA 137 11/03/2023   K 4.0 11/03/2023   CL 104 11/03/2023   CREATININE 1.02 11/03/2023   BUN 11 11/03/2023   CO2 29 11/03/2023   TSH 4.39 11/03/2023   PSA 6.26 (H) 11/03/2023   INR 1.1 08/16/2016   HGBA1C 6.6 (H) 02/02/2024   MICROALBUR 1.2 02/02/2024    No results found.  Assessment & Plan:   Problem List Items Addressed This Visit     Insomnia - Primary   Due to tinnitus Clonazepam prn  Potential benefits of a long term opioids use as well as potential risks (i.e. addiction risk, apnea etc) and complications (i.e. Somnolence, constipation and others) were explained to the patient and were aknowledged.       Essential hypertension (Chronic)   Diabetes mellitus type 2 with complications  (HCC)   Start Mounjaro      Relevant Medications   tirzepatide (MOUNJARO) 2.5 MG/0.5ML Pen   Tinnitus   Ringing in the ears (10% disability from [place])  Meds ordered this encounter  Medications   clonazePAM (KLONOPIN) 0.5 MG tablet    Sig: Take 1 tablet (0.5 mg total) by mouth at bedtime.    Dispense:  30 tablet    Refill:  5   tirzepatide (MOUNJARO) 2.5 MG/0.5ML Pen    Sig: Inject 2.5 mg into the skin once a week.    Dispense:  2 mL    Refill:  2      Follow-up: Return in about 2 months (around 04/07/2024) for a follow-up visit.  Marolyn Noel, MD

## 2024-02-07 ENCOUNTER — Other Ambulatory Visit (HOSPITAL_COMMUNITY): Payer: Self-pay

## 2024-02-07 ENCOUNTER — Telehealth: Payer: Self-pay

## 2024-02-07 NOTE — Telephone Encounter (Signed)
 Pharmacy Patient Advocate Encounter  Received notification from [employer] that Prior Authorization for MOUNJARO 2.5MG /0.5ML has been APPROVED from 02/07/24 to 08/08/24   PA #/Case ID/Reference #: [id number]

## 2024-02-07 NOTE — Telephone Encounter (Signed)
 Pharmacy Patient Advocate Encounter   Received notification from Onbase that prior authorization for Mounjaro 2.5MG /0.5ML auto-injectors is required/requested.   Insurance verification completed.   The patient is insured through [facility] .   Per test claim: PA required; PA submitted to above mentioned insurance via CoverMyMeds Key/confirmation #/EOC  [id number] Status is pending

## 2024-02-22 NOTE — Addendum Note (Signed)
 Addended by: TAWNI DRILLING D on: 02/22/2024 05:01 PM   Modules accepted: Orders

## 2024-02-22 NOTE — Progress Notes (Signed)
 Remote pacemaker transmission.

## 2024-02-23 ENCOUNTER — Telehealth: Payer: Self-pay

## 2024-02-23 NOTE — Telephone Encounter (Signed)
-----   Message from Sandor LULLA Flatter sent at 02/22/2024  5:11 PM EDT ----- Patient was seen by Dr. [place] and has received Cardiology clearance to proceed with colonoscopy.  Okay to schedule in the [facility].

## 2024-02-23 NOTE — Telephone Encounter (Signed)
 Left message for patient to return call.  Will continue efforts.

## 2024-02-27 NOTE — Telephone Encounter (Signed)
 PT retuning call. Please advise.

## 2024-02-27 NOTE — Telephone Encounter (Signed)
 Left message for patient to return call.  Will continue efforts.

## 2024-02-27 NOTE — Telephone Encounter (Signed)
 Left message for patient to return call. Will continue efforts. When patient calls back to the office, please scheduled previsit and direct colon per Dr San.

## 2024-03-01 NOTE — Telephone Encounter (Signed)
 Multiple attempts to reach the patient by phone to schedule previst/colonoscopy per Dr Baird with no success.  MyChart message sent to patient with instructions to contact our office to schedule these appointments.

## 2024-03-13 ENCOUNTER — Telehealth: Payer: Self-pay | Admitting: Pharmacist

## 2024-03-13 ENCOUNTER — Other Ambulatory Visit (HOSPITAL_COMMUNITY): Payer: Self-pay

## 2024-03-13 ENCOUNTER — Telehealth: Payer: Self-pay

## 2024-03-13 NOTE — Telephone Encounter (Signed)
 Pharmacy Patient Advocate Encounter   Received notification from Physician's Office that prior authorization for REPATHA  is required/requested.   Insurance verification completed.   The patient is insured through [facility] .   Per test claim: PA required; PA submitted to above mentioned insurance via CoverMyMeds Key/confirmation #/EOC [id number] Status is pending

## 2024-03-13 NOTE — Telephone Encounter (Signed)
 Pharmacy Patient Advocate Encounter  Received notification from [employer] that Prior Authorization for REPATHA  has been APPROVED from 03/13/24 to 09/13/24. Ran test claim, Copay is $35. This test claim was processed through [facility]- copay amounts may vary at other pharmacies due to pharmacy/plan contracts, or as the patient moves through the different stages of their insurance plan.

## 2024-03-13 NOTE — Progress Notes (Unsigned)
 Patient ID: Anthony Chapman                 DOB: 13-Jun-1951                    MRN: [id number]      HPI: Anthony Chapman is a 73 y.o. male patient referred to lipid clinic by Dr.Jordan. PMH is significant for hx of CAD (s/p DES to RCA in 2010, DES to RCA and LCx in 2015, DES to LCx 08/2016), HTN, HLD, chronic LBBB, T2DM, Obesity    Last LDL 89 on maximal Crestor  dose. Zetia  10 mg daily were added on 01/30/2024. Patient tolerates both meds well without side effects. Patient presented today with his wife for today's appointment. He just started Mounjaro  for diabetes management he will be seeing PT and dietitian to learn more about exercise and healy diet.  Reviewed options for lowering LDL cholesterol, including  PCSK-9 inhibitors, bempedoic acid and inclisiran.  Discussed mechanisms of action, dosing, side effects and potential decreases in LDL cholesterol.  Also reviewed cost information and potential options for patient assistance.  Current Medications: Crestor  40 mg daily and Zetia  10 mg daily  Intolerances: none  Risk Factors: CAD (s/p DES to RCA in 2010, DES to RCA and LCx in 2015, DES to LCx 08/2016), HTN, HLD, T2DM, family hx of CAD LDL goal: <55 mg/dl   Diet: started Mounjaro  so started to watch carb and fat intake  Drink: water, sweet tea and koolaid   Exercise: none   Family History:  Relation Problem Comments  Mother (Deceased) CAD   Hypertension     Father (Deceased) Arthritis   Hypertension     Maternal Aunt CAD     Other Hypertension     Social History:   Labs:  Lipid Panel     Component Value Date/Time   CHOL 137 11/03/2023 1014   CHOL 184 04/09/2022 1147   TRIG 93.0 11/03/2023 1014   HDL 31.80 (L) 11/03/2023 1014   HDL 35 (L) 04/09/2022 1147   CHOLHDL 4 11/03/2023 1014   VLDL 18.6 11/03/2023 1014   LDLCALC 86 11/03/2023 1014   LDLCALC 130 (H) 04/09/2022 1147   LDLDIRECT 199.5 07/25/2012 0935   LABVLDL 19 04/09/2022 1147    Past Medical History:   Diagnosis Date   Allergy    Spring- mild    Anginal pain (HCC)    Arthritis    all over (08/27/2016)   CAD (coronary artery disease)    a.  stent placement to the RCA in 2010 b. cath in 2015 with PCI to the RCA (2.49mm x22mm Resolute DES) and LCx (3.57mm x75mm Resolute DES).  c. 08/2016: cath showing 100% ISR of d-RCA with collaterals, 10% Ost LAD ISR, 90% mid-Cx stenosis (3.0x26mm Resolute DES placed)   ED (erectile dysfunction)    GERD (gastroesophageal reflux disease)    HTN (hypertension)    Hyperlipemia    LBBB (left bundle branch block)    Myocardial infarction (HCC) 2010; 2015   Sleep apnea    wears cpap     Current Outpatient Medications on File Prior to Visit  Medication Sig Dispense Refill   amLODipine  (NORVASC ) 10 MG tablet TAKE 1 TABLET BY MOUTH DAILY 90 tablet 2   aspirin  81 MG EC tablet Take 1 tablet (81 mg total) by mouth daily. 30 tablet 0   carvedilol  (COREG ) 25 MG tablet TAKE 1 TABLET BY MOUTH TWICE  DAILY WITH MEALS 180 tablet 3  clonazePAM  (KLONOPIN ) 0.5 MG tablet Take 1 tablet (0.5 mg total) by mouth at bedtime. 30 tablet 5   diphenhydrAMINE  HCl (BENADRYL  ALLERGY PO) Take 1 tablet by mouth as needed.     ezetimibe  (ZETIA ) 10 MG tablet Take 1 tablet (10 mg total) by mouth daily. 90 tablet 3   ibuprofen  (ADVIL ) 600 MG tablet Take 1 twice a day with food as needed for pain 60 tablet 1   isosorbide  mononitrate (IMDUR ) 60 MG 24 hr tablet TAKE 1 TABLET BY MOUTH DAILY 90 tablet 3   montelukast  (SINGULAIR ) 10 MG tablet Take 10 mg by mouth daily as needed (allergies).     nitroGLYCERIN  (NITROSTAT ) 0.4 MG SL tablet DISSOLVE 1 TABLET UNDER THE  TONGUE EVERY 5 MINUTES AS NEEDED FOR CHEST PAIN. MAX OF 3 TABLETS IN 15 MINUTES. CALL 911 IF PAIN  PERSISTS. 50 tablet 7   ranolazine  (RANEXA ) 500 MG 12 hr tablet TAKE 2 TABLETS BY MOUTH TWICE  DAILY 360 tablet 2   rosuvastatin  (CRESTOR ) 40 MG tablet TAKE 1 TABLET BY MOUTH DAILY 15 tablet 0   spironolactone  (ALDACTONE ) 25 MG tablet  Take 0.5 tablets (12.5 mg total) by mouth daily. 90 tablet 0   tirzepatide  (MOUNJARO ) 2.5 MG/0.5ML Pen Inject 2.5 mg into the skin once a week. 2 mL 2   VITAMIN D , CHOLECALCIFEROL , PO Take 1 tablet by mouth daily as needed (vitamin d  supplement).     Current Facility-Administered Medications on File Prior to Visit  Medication Dose Route Frequency Provider Last Rate Last Admin   0.9 %  sodium chloride  infusion  500 mL Intravenous Once Cirigliano, Vito V, DO        Allergies  Allergen Reactions   Losartan  Other (See Comments)    angioedema    Assessment/Plan:  1. Hyperlipidemia -  No problems updated. No problem-specific Assessment & Plan notes found for this encounter.    Thank you,  Robbi Blanch, Pharm.D [facility] [address], [place], [place] [address] Phone: [phone]; Fax: [phone]

## 2024-03-13 NOTE — Telephone Encounter (Signed)
 PA request has been Submitted. New Encounter has been or will be created for follow up. For additional info see Pharmacy Prior Auth telephone encounter from 03/13/24.

## 2024-03-14 ENCOUNTER — Ambulatory Visit: Attending: Cardiology | Admitting: Pharmacist

## 2024-03-14 ENCOUNTER — Telehealth: Payer: Self-pay | Admitting: Pharmacy Technician

## 2024-03-14 ENCOUNTER — Other Ambulatory Visit (HOSPITAL_COMMUNITY): Payer: Self-pay

## 2024-03-14 ENCOUNTER — Encounter: Payer: Self-pay | Admitting: Pharmacist

## 2024-03-14 DIAGNOSIS — E785 Hyperlipidemia, unspecified: Secondary | ICD-10-CM

## 2024-03-14 MED ORDER — REPATHA SURECLICK 140 MG/ML ~~LOC~~ SOAJ
140.0000 mg | SUBCUTANEOUS | 3 refills | Status: DC
  Filled 2024-03-14 (×2): qty 6, 84d supply, fill #0

## 2024-03-14 NOTE — Assessment & Plan Note (Signed)
 Assessment:  LDL goal: < 55mg /dl last LDLc 89 mg/dl (93/7974) while on high intensity statin  Tolerates Zetia  and high intensity statins well without any side effects  Discussed next potential options (PCSK-9 inhibitors, bempedoic acid and inclisiran); cost, dosing efficacy, side effects  Will be starting Mounjaro  - watching carb and fat intake, will be going to PT to establish exercise regimen   Plan: Continue taking current medications (Crestor  40 mg daily and Zetia  10 mg daily) Start taking Repatha  140 mg SQ Q14D  Will repeat lab end of Oct 2025 to assess LDL level

## 2024-03-14 NOTE — Patient Instructions (Signed)
 Medication changes: continue taking Crestor  40 mg daily and Zetia  10 mg daily and start taking Repatha  140 mg under the skin every 14 days   Repatha  is a cholesterol medication that improved your body's ability to get rid of bad cholesterol known as LDL. It can lower your LDL up to 60%! It is an injection that is given under the skin every 2 weeks. The medication often requires a prior authorization from your insurance company. The most common side effects of Repatha  include runny nose, symptoms of the common cold, rarely flu or flu-like symptoms, back/muscle pain in about 3-4% of the patients, and redness, pain, or bruising at the injection site.   Lab orders: We want to repeat labs on Oct 25,2025      Copay Assistance:  The [employer] foundation offers assistance to help pay for medication copays.  They will cover copays for all cholesterol lowering meds, including statins, fibrates, omega-3 oils, ezetimibe , Repatha , Praluent, Nexletol, Nexlizet.  The cards are usually good for $2,500 or 12 months, whichever comes first. Go to healthwellfoundation.org Click on "Apply Now" Answer questions as to whom is applying (patient or representative) Your disease fund will be "hypercholesterolemia - Medicare access" Select the cholesterol medication you need assistance with (Repatha , Praluent, Nexlizet...) They will ask question about qualifying diagnosis - you can mark yes; and do you have insurance coverage.   When they ask what type of assistance you are interested in - copay assistance When you submit, the approval is usually within minutes.  You will need to print the card information from the site You will need to show this information to your pharmacy, they will bill your Medicare Part D plan first -then bill [employer] --for the copay.   You can also call them at [phone], although the hold times can be quite long.

## 2024-03-14 NOTE — Telephone Encounter (Signed)
 Patient Advocate Encounter   The patient was approved for a [employer] grant that will help cover the cost of repatha  Total amount awarded, 2500.  Effective: 02/13/24 - 02/11/25   APW:[id number] ERW:[id number] Hmnle:[id number] PI:[id number] [employer] ID: [id number]   Pharmacy provided with approval and processing information. Patient informed via mychart

## 2024-03-15 ENCOUNTER — Other Ambulatory Visit: Payer: Self-pay | Admitting: Internal Medicine

## 2024-03-16 ENCOUNTER — Other Ambulatory Visit: Payer: Self-pay | Admitting: Internal Medicine

## 2024-04-03 ENCOUNTER — Ambulatory Visit (INDEPENDENT_AMBULATORY_CARE_PROVIDER_SITE_OTHER): Payer: Medicare Other

## 2024-04-03 DIAGNOSIS — I442 Atrioventricular block, complete: Secondary | ICD-10-CM | POA: Diagnosis not present

## 2024-04-04 ENCOUNTER — Ambulatory Visit: Payer: Self-pay | Admitting: Cardiology

## 2024-04-04 LAB — CUP PACEART REMOTE DEVICE CHECK
Battery Remaining Longevity: 89 mo
Battery Remaining Percentage: 74 %
Battery Voltage: 3.01 V
Brady Statistic AP VP Percent: 1 %
Brady Statistic AP VS Percent: 5.4 %
Brady Statistic AS VP Percent: 1 %
Brady Statistic AS VS Percent: 94 %
Brady Statistic RA Percent Paced: 5.1 %
Brady Statistic RV Percent Paced: 1 %
Date Time Interrogation Session: 20250826020014
Implantable Lead Connection Status: 753985
Implantable Lead Connection Status: 753985
Implantable Lead Implant Date: 20220223
Implantable Lead Implant Date: 20220223
Implantable Lead Location: 753859
Implantable Lead Location: 753860
Implantable Pulse Generator Implant Date: 20220223
Lead Channel Impedance Value: 440 Ohm
Lead Channel Impedance Value: 440 Ohm
Lead Channel Pacing Threshold Amplitude: 0.75 V
Lead Channel Pacing Threshold Amplitude: 0.75 V
Lead Channel Pacing Threshold Pulse Width: 0.5 ms
Lead Channel Pacing Threshold Pulse Width: 0.5 ms
Lead Channel Sensing Intrinsic Amplitude: 12 mV
Lead Channel Sensing Intrinsic Amplitude: 2.3 mV
Lead Channel Setting Pacing Amplitude: 2 V
Lead Channel Setting Pacing Amplitude: 2.5 V
Lead Channel Setting Pacing Pulse Width: 0.5 ms
Lead Channel Setting Sensing Sensitivity: 2 mV
Pulse Gen Model: 2272
Pulse Gen Serial Number: 3900218

## 2024-04-23 NOTE — Progress Notes (Signed)
 Remote PPM Transmission

## 2024-05-21 ENCOUNTER — Other Ambulatory Visit: Payer: Self-pay | Admitting: Internal Medicine

## 2024-07-03 ENCOUNTER — Ambulatory Visit (INDEPENDENT_AMBULATORY_CARE_PROVIDER_SITE_OTHER): Payer: Medicare Other

## 2024-07-03 DIAGNOSIS — I442 Atrioventricular block, complete: Secondary | ICD-10-CM | POA: Diagnosis not present

## 2024-07-03 LAB — CUP PACEART REMOTE DEVICE CHECK
Battery Remaining Longevity: 87 mo
Battery Remaining Percentage: 72 %
Battery Voltage: 3.01 V
Brady Statistic AP VP Percent: 1 %
Brady Statistic AP VS Percent: 5.2 %
Brady Statistic AS VP Percent: 1 %
Brady Statistic AS VS Percent: 95 %
Brady Statistic RA Percent Paced: 4.9 %
Brady Statistic RV Percent Paced: 1 %
Date Time Interrogation Session: 20251125020014
Implantable Lead Connection Status: 753985
Implantable Lead Connection Status: 753985
Implantable Lead Implant Date: 20220223
Implantable Lead Implant Date: 20220223
Implantable Lead Location: 753859
Implantable Lead Location: 753860
Implantable Pulse Generator Implant Date: 20220223
Lead Channel Impedance Value: 430 Ohm
Lead Channel Impedance Value: 430 Ohm
Lead Channel Pacing Threshold Amplitude: 0.75 V
Lead Channel Pacing Threshold Amplitude: 0.75 V
Lead Channel Pacing Threshold Pulse Width: 0.5 ms
Lead Channel Pacing Threshold Pulse Width: 0.5 ms
Lead Channel Sensing Intrinsic Amplitude: 12 mV
Lead Channel Sensing Intrinsic Amplitude: 3 mV
Lead Channel Setting Pacing Amplitude: 2 V
Lead Channel Setting Pacing Amplitude: 2.5 V
Lead Channel Setting Pacing Pulse Width: 0.5 ms
Lead Channel Setting Sensing Sensitivity: 2 mV
Pulse Gen Model: 2272
Pulse Gen Serial Number: 3900218

## 2024-07-04 ENCOUNTER — Ambulatory Visit: Payer: Self-pay | Admitting: Cardiology

## 2024-07-04 NOTE — Progress Notes (Signed)
 Remote PPM Transmission

## 2024-07-14 ENCOUNTER — Telehealth: Payer: Self-pay | Admitting: Cardiology

## 2024-07-14 MED ORDER — CARVEDILOL 25 MG PO TABS: 25.0000 mg | ORAL_TABLET | Freq: Two times a day (BID) | ORAL | 3 refills | Status: AC

## 2024-07-14 NOTE — Telephone Encounter (Signed)
 Patient called answering service this afternoon needing refill of his carvedilol .  Sent medication to his requested pharmacy

## 2024-08-17 ENCOUNTER — Other Ambulatory Visit: Payer: Self-pay | Admitting: Internal Medicine

## 2024-08-17 ENCOUNTER — Telehealth: Payer: Self-pay | Admitting: Cardiology

## 2024-08-17 MED ORDER — REPATHA SURECLICK 140 MG/ML ~~LOC~~ SOAJ: 140.0000 mg | SUBCUTANEOUS | 3 refills | Status: AC

## 2024-08-17 NOTE — Telephone Encounter (Signed)
 RX sent in.

## 2024-08-17 NOTE — Telephone Encounter (Unsigned)
 Copied from CRM #[id number]. Topic: Clinical - Medication Refill >> Aug 17, 2024 12:10 PM Amy B wrote: Medication: tirzepatide  (MOUNJARO ) 2.5 MG/0.5ML Pen  Has the patient contacted their pharmacy? No (Agent: If no, request that the patient contact the pharmacy for the refill. If patient does not wish to contact the pharmacy document the reason why and proceed with request.) (Agent: If yes, when and what did the pharmacy advise?)  This is the patient's preferred pharmacy:  [id number] - [place], [place] - [address] [place] [address] Phone: [phone] Fax: [phone]  [facility] - [place], [place] - [address] [place] [address] Phone: [phone] Fax: [phone]  Is this the correct pharmacy for this prescription? Yes If no, delete pharmacy and type the correct one.   Has the prescription been filled recently? No  Is the patient out of the medication? Yes  Has the patient been seen for an appointment in the last year OR does the patient have an upcoming appointment? Yes  Can we respond through MyChart? Yes  Agent: Please be advised that Rx refills may take up to 3 business days. We ask that you follow-up with your pharmacy.

## 2024-08-17 NOTE — Telephone Encounter (Signed)
" °*  STAT* If patient is at the pharmacy, call can be transferred to refill team.   1. Which medications need to be refilled? (please list name of each medication and dose if known) Evolocumab  (REPATHA  SURECLICK) 140 MG/ML SOAJ   2. Which pharmacy/location (including street and city if local pharmacy) is medication to be sent to? [id number] - [place], [place] - [address]   3. Do they need a 30 day or 90 day supply? 90  "

## 2024-08-20 MED ORDER — TIRZEPATIDE 2.5 MG/0.5ML ~~LOC~~ SOAJ: 2.5000 mg | SUBCUTANEOUS | 3 refills | Status: AC

## 2024-08-28 ENCOUNTER — Other Ambulatory Visit: Payer: Self-pay | Admitting: Cardiology

## 2024-08-28 ENCOUNTER — Telehealth: Payer: Self-pay | Admitting: Cardiology

## 2024-08-28 MED ORDER — NITROGLYCERIN 0.4 MG SL SUBL: SUBLINGUAL_TABLET | SUBLINGUAL | 0 refills | Status: AC

## 2024-08-28 NOTE — Telephone Encounter (Signed)
" °*  STAT* If patient is at the pharmacy, call can be transferred to refill team.   1. Which medications need to be refilled? (please list name of each medication and dose if known)   nitroGLYCERIN  (NITROSTAT ) 0.4 MG SL tablet    2. Which pharmacy/location (including street and city if local pharmacy) is medication to be sent to?  [id number] - [place], [place] - [address]      3. Do they need a 30 day or 90 day supply? 90 day  "

## 2024-09-04 NOTE — Telephone Encounter (Signed)
 Refill was sent 08/28/24.

## 2024-10-02 ENCOUNTER — Ambulatory Visit: Admitting: Primary Care

## 2024-10-02 ENCOUNTER — Ambulatory Visit: Payer: Medicare Other

## 2025-01-01 ENCOUNTER — Ambulatory Visit: Payer: Medicare Other

## 2025-01-24 ENCOUNTER — Ambulatory Visit: Admitting: Cardiology

## 2025-01-29 ENCOUNTER — Ambulatory Visit

## 2025-04-02 ENCOUNTER — Ambulatory Visit: Payer: Medicare Other
